# Patient Record
Sex: Female | Born: 1940 | Race: White | Hispanic: No | Marital: Married | Smoking: Former smoker
Health system: Southern US, Community
[De-identification: ages and names within clinical notes are randomized; demographics above are authoritative.]

## PROBLEM LIST (undated history)

## (undated) DIAGNOSIS — R32 Unspecified urinary incontinence: Secondary | ICD-10-CM

## (undated) DIAGNOSIS — C3490 Malignant neoplasm of unspecified part of unspecified bronchus or lung: Secondary | ICD-10-CM

## (undated) DIAGNOSIS — T7840XA Allergy, unspecified, initial encounter: Secondary | ICD-10-CM

## (undated) DIAGNOSIS — F329 Major depressive disorder, single episode, unspecified: Secondary | ICD-10-CM

## (undated) DIAGNOSIS — K219 Gastro-esophageal reflux disease without esophagitis: Secondary | ICD-10-CM

## (undated) DIAGNOSIS — I10 Essential (primary) hypertension: Secondary | ICD-10-CM

## (undated) DIAGNOSIS — K760 Fatty (change of) liver, not elsewhere classified: Secondary | ICD-10-CM

## (undated) DIAGNOSIS — I519 Heart disease, unspecified: Secondary | ICD-10-CM

## (undated) DIAGNOSIS — F32A Depression, unspecified: Secondary | ICD-10-CM

## (undated) DIAGNOSIS — F419 Anxiety disorder, unspecified: Secondary | ICD-10-CM

## (undated) HISTORY — DX: Gastro-esophageal reflux disease without esophagitis: K21.9

## (undated) HISTORY — DX: Fatty (change of) liver, not elsewhere classified: K76.0

## (undated) HISTORY — DX: Heart disease, unspecified: I51.9

## (undated) HISTORY — PX: NECK SURGERY: SHX720

## (undated) HISTORY — DX: Anxiety disorder, unspecified: F41.9

## (undated) HISTORY — DX: Essential (primary) hypertension: I10

## (undated) HISTORY — DX: Depression, unspecified: F32.A

## (undated) HISTORY — DX: Major depressive disorder, single episode, unspecified: F32.9

## (undated) HISTORY — PX: ABDOMINAL HYSTERECTOMY: SHX81

## (undated) HISTORY — DX: Malignant neoplasm of unspecified part of unspecified bronchus or lung: C34.90

## (undated) HISTORY — PX: LUNG CANCER SURGERY: SHX702

## (undated) HISTORY — DX: Unspecified urinary incontinence: R32

## (undated) HISTORY — DX: Allergy, unspecified, initial encounter: T78.40XA

---

## 1975-02-09 HISTORY — PX: LAPAROSCOPIC HYSTERECTOMY: SHX1926

## 2004-09-15 ENCOUNTER — Ambulatory Visit: Payer: Self-pay | Admitting: Unknown Physician Specialty

## 2006-04-18 ENCOUNTER — Ambulatory Visit: Payer: Self-pay | Admitting: Unknown Physician Specialty

## 2006-11-14 ENCOUNTER — Ambulatory Visit: Payer: Self-pay | Admitting: Unknown Physician Specialty

## 2006-11-17 ENCOUNTER — Ambulatory Visit: Payer: Self-pay | Admitting: Internal Medicine

## 2007-09-14 ENCOUNTER — Ambulatory Visit: Payer: Self-pay | Admitting: Internal Medicine

## 2007-09-19 ENCOUNTER — Encounter: Payer: Self-pay | Admitting: Internal Medicine

## 2007-10-02 ENCOUNTER — Ambulatory Visit: Payer: Self-pay | Admitting: Family Medicine

## 2007-10-10 ENCOUNTER — Encounter: Payer: Self-pay | Admitting: Internal Medicine

## 2008-08-21 ENCOUNTER — Ambulatory Visit: Payer: Self-pay | Admitting: Unknown Physician Specialty

## 2008-10-04 ENCOUNTER — Ambulatory Visit: Payer: Self-pay | Admitting: Family Medicine

## 2010-07-13 ENCOUNTER — Ambulatory Visit: Payer: Self-pay | Admitting: Unknown Physician Specialty

## 2012-03-18 ENCOUNTER — Emergency Department: Payer: Self-pay | Admitting: Emergency Medicine

## 2012-03-18 LAB — COMPREHENSIVE METABOLIC PANEL
Albumin: 4.1 g/dL (ref 3.4–5.0)
Alkaline Phosphatase: 94 U/L (ref 50–136)
Anion Gap: 8 (ref 7–16)
BUN: 24 mg/dL — ABNORMAL HIGH (ref 7–18)
Bilirubin,Total: 1.2 mg/dL — ABNORMAL HIGH (ref 0.2–1.0)
Calcium, Total: 9.7 mg/dL (ref 8.5–10.1)
Chloride: 103 mmol/L (ref 98–107)
Co2: 27 mmol/L (ref 21–32)
Creatinine: 1.13 mg/dL (ref 0.60–1.30)
EGFR (African American): 56 — ABNORMAL LOW
EGFR (Non-African Amer.): 49 — ABNORMAL LOW
Glucose: 134 mg/dL — ABNORMAL HIGH (ref 65–99)
Osmolality: 282 (ref 275–301)
Potassium: 4.3 mmol/L (ref 3.5–5.1)
SGOT(AST): 26 U/L (ref 15–37)
SGPT (ALT): 25 U/L (ref 12–78)
Sodium: 138 mmol/L (ref 136–145)
Total Protein: 8.5 g/dL — ABNORMAL HIGH (ref 6.4–8.2)

## 2012-03-18 LAB — URINALYSIS, COMPLETE
Hyaline Cast: 2
RBC,UR: 52 /HPF (ref 0–5)
Squamous Epithelial: 12
Transitional Epi: 4
WBC UR: 51 /HPF (ref 0–5)

## 2012-03-18 LAB — CBC
HCT: 45.1 % (ref 35.0–47.0)
HGB: 15.1 g/dL (ref 12.0–16.0)
MCH: 30.7 pg (ref 26.0–34.0)
MCHC: 33.5 g/dL (ref 32.0–36.0)
MCV: 92 fL (ref 80–100)
Platelet: 260 10*3/uL (ref 150–440)
RBC: 4.92 10*6/uL (ref 3.80–5.20)
RDW: 12.6 % (ref 11.5–14.5)
WBC: 13.2 10*3/uL — ABNORMAL HIGH (ref 3.6–11.0)

## 2012-03-18 LAB — TROPONIN I: Troponin-I: <0.02 ng/mL

## 2012-03-19 LAB — URINE CULTURE

## 2012-03-24 LAB — CULTURE, BLOOD (SINGLE)

## 2012-03-31 ENCOUNTER — Ambulatory Visit: Payer: Self-pay

## 2012-05-25 ENCOUNTER — Ambulatory Visit: Payer: Self-pay | Admitting: Unknown Physician Specialty

## 2013-08-30 DIAGNOSIS — N951 Menopausal and female climacteric states: Secondary | ICD-10-CM | POA: Insufficient documentation

## 2013-08-30 DIAGNOSIS — M797 Fibromyalgia: Secondary | ICD-10-CM | POA: Insufficient documentation

## 2013-08-30 DIAGNOSIS — K760 Fatty (change of) liver, not elsewhere classified: Secondary | ICD-10-CM | POA: Insufficient documentation

## 2013-08-30 DIAGNOSIS — N183 Chronic kidney disease, stage 3 unspecified: Secondary | ICD-10-CM | POA: Insufficient documentation

## 2013-08-30 DIAGNOSIS — I10 Essential (primary) hypertension: Secondary | ICD-10-CM | POA: Insufficient documentation

## 2013-08-30 DIAGNOSIS — J302 Other seasonal allergic rhinitis: Secondary | ICD-10-CM | POA: Insufficient documentation

## 2013-08-30 DIAGNOSIS — I341 Nonrheumatic mitral (valve) prolapse: Secondary | ICD-10-CM | POA: Insufficient documentation

## 2014-02-08 DIAGNOSIS — C3490 Malignant neoplasm of unspecified part of unspecified bronchus or lung: Secondary | ICD-10-CM

## 2014-02-08 HISTORY — DX: Malignant neoplasm of unspecified part of unspecified bronchus or lung: C34.90

## 2014-03-13 ENCOUNTER — Ambulatory Visit: Payer: Self-pay | Admitting: Internal Medicine

## 2014-03-29 ENCOUNTER — Ambulatory Visit: Payer: Self-pay | Admitting: Oncology

## 2014-04-04 ENCOUNTER — Ambulatory Visit: Payer: Self-pay | Admitting: Oncology

## 2014-04-09 ENCOUNTER — Ambulatory Visit
Admit: 2014-04-09 | Disposition: A | Discharge status: Home / Self Care | Payer: Self-pay | Attending: Oncology | Admitting: Oncology

## 2014-04-24 ENCOUNTER — Ambulatory Visit: Payer: Self-pay

## 2014-04-30 ENCOUNTER — Inpatient Hospital Stay: Payer: Self-pay

## 2014-05-03 LAB — CBC WITH DIFFERENTIAL/PLATELET
Basophil #: 0 10*3/uL (ref 0.0–0.1)
Basophil %: 0.4 %
Eosinophil #: 0.1 10*3/uL (ref 0.0–0.7)
Eosinophil %: 0.8 %
HCT: 33.2 % — ABNORMAL LOW (ref 35.0–47.0)
HGB: 10.8 g/dL — ABNORMAL LOW (ref 12.0–16.0)
Lymphocyte #: 1.2 10*3/uL (ref 1.0–3.6)
Lymphocyte %: 10 %
MCH: 29.8 pg (ref 26.0–34.0)
MCHC: 32.5 g/dL (ref 32.0–36.0)
MCV: 92 fL (ref 80–100)
Monocyte #: 1.1 x10 3/mm — ABNORMAL HIGH (ref 0.2–0.9)
Monocyte %: 9.2 %
Neutrophil #: 9.7 10*3/uL — ABNORMAL HIGH (ref 1.4–6.5)
Neutrophil %: 79.6 %
Platelet: 163 10*3/uL (ref 150–440)
RBC: 3.62 10*6/uL — ABNORMAL LOW (ref 3.80–5.20)
RDW: 12.8 % (ref 11.5–14.5)
WBC: 12.2 10*3/uL — ABNORMAL HIGH (ref 3.6–11.0)

## 2014-05-03 LAB — BASIC METABOLIC PANEL
Anion Gap: 5 — ABNORMAL LOW (ref 7–16)
BUN: 8 mg/dL
Calcium, Total: 8 mg/dL — ABNORMAL LOW
Chloride: 104 mmol/L
Co2: 27 mmol/L
Creatinine: 0.65 mg/dL
EGFR (African American): >60
EGFR (Non-African Amer.): >60
Glucose: 112 mg/dL — ABNORMAL HIGH
Potassium: 3.6 mmol/L
Sodium: 136 mmol/L

## 2014-05-10 ENCOUNTER — Ambulatory Visit
Admit: 2014-05-10 | Disposition: A | Discharge status: Home / Self Care | Payer: Self-pay | Attending: Oncology | Admitting: Oncology

## 2014-05-24 ENCOUNTER — Other Ambulatory Visit: Payer: Self-pay | Admitting: Oncology

## 2014-05-24 DIAGNOSIS — C3490 Malignant neoplasm of unspecified part of unspecified bronchus or lung: Secondary | ICD-10-CM

## 2014-06-03 LAB — SURGICAL PATHOLOGY

## 2014-06-09 NOTE — Discharge Summary (Signed)
PATIENT NAME:  Christy, [name] MR#:  [id number] DATE OF BIRTH:  21-Jul-1940  DATE OF ADMISSION:  04/30/2014 DATE OF DISCHARGE:  05/06/2014  ADMITTING DIAGNOSIS: Right upper lobe mass.   DISCHARGE DIAGNOSIS: Right upper lobe mass (final pathology revealing a T1b, N0, M0 adenocarcinoma of the lung).   HOSPITAL COURSE: [name] is a 74 year old, white female, who was recently diagnosed with a right upper lobe mass. This mass was positive on PET scan and had characteristics consistent with a primary lung cancer. She was appropriately evaluated and found to be a suitable candidate for surgery. She was admitted to the hospital following her right thoracotomy and right upper lobectomy. She was nursed overnight in the intensive care unit and then for several days on the floor. Ultimately, her chest tubes were able to be removed and she did not have any air leak or pleural fluid. After the chest tubes were removed, her chest x-ray showed the lung to be completely expanded. There were no untoward complications in the postoperative period and she was discharged to home.   At the time of her discharge, her wounds were healing as expected. Her lungs were clear and her heart was regular.   DISCHARGE INSTRUCTIONS: She was given instructions to follow up with Dr. Genevive Bi in 1 week. She will have a chest x-ray made prior to her visit.   MEDICATIONS: At the time of discharge included benazepril, estradiol, calcium, Lipoflavonoid, multivitamins, magnesium, Ocuvite and vitamin D. She was also given a prescription for Norco 5/325 to take as needed.   Thank you very much for allowing me to participate in her care.   ____________________________ Lew Dawes. Genevive Bi, MD teo:JT D: 05/20/2014 11:53:32 ET T: 05/20/2014 12:05:54 ET JOB#: [id number]  cc: [name] E. Genevive Bi, MD, <Dictator>  Louis Matte MD ELECTRONICALLY SIGNED 05/28/2014 11:05

## 2014-06-09 NOTE — Op Note (Signed)
PATIENT NAME:  Christy [name], Christy [name] MR#:  [id number] DATE OF BIRTH:  1940-07-02  DATE OF PROCEDURE:  04/30/2014  SURGEON:  [name] E. Genevive Bi, MD  ASSISTANT:  Dr. Bronson Ing   PREOPERATIVE DIAGNOSIS: Right upper lobe mass.   POSTOPERATIVE DIAGNOSIS: Right upper lobe mass.  OPERATIONS PERFORMED: 1. Preoperative bronchoscopy to assess endobronchial anatomy.  2. Right thoracotomy with right upper lobectomy.   INDICATIONS FOR PROCEDURE: Christy [name] is a 74 year old woman with a prior history of smoking who was recently evaluated and found to be a suitable candidate for surgical resection of a right upper lobe mass identified on routine examination. She had been extensively evaluated by Dr. Delight Hoh and found to be a suitable candidate from his standpoint and after being apprised of the indications and risks of surgery, she gave her informed consent.   DESCRIPTION OF PROCEDURE: The patient was brought to the operating suite and placed in the supine position. General endotracheal anesthesia was given with a double-lumen tube. Preoperative bronchoscopy was carried out and was normal to the subsegmental levels bilaterally. The patient was then turned for a right thoracotomy. All pressure points were carefully padded. The patient was prepped and draped in the usual sterile fashion. A posterolateral 5th interspace thoracotomy was performed. The latissimus muscle was divided but the serratus was spared. The chest was entered. We could see that there was a large mass within the right upper lobe measuring approximately 3 cm.   There were some adhesions to the right upper lobe which were taken down with electrocautery. The pleura around the hilum was incised. We identified the branches of the pulmonary artery going to the upper lobe as well as the branches of the pulmonary vein. The middle lobe vein was quite separate from the upper lobe vein. We opened the fissure and identified the posterior ascending branch  and then completed the fissures both anteriorly and posteriorly with an endoscopic stapler with gold loads. The branches of the pulmonary artery were in their standard locations. The apical anterior branch and the posterior ascending branch were then taken with the stapler. The superior pulmonary vein to the upper lobe was taken with the stapler as well. The only remaining structure at this point was the bronchus. This was secured with a TX30 stapler. Before dividing the bronchus, the middle and lower lobes were ventilated and they ventilated quite nicely. We then transected the bronchus. We then leak checked the bronchus at 30 cm of water pressure and there was no air leak. Two chest tubes were inserted in standard fashion. The tubes were secured with #1 silk. The inferior pulmonary ligament was divided.   The middle and lower lobes ventilated quite nicely, and we then closed the chest. Vicryl #2 paracostal sutures were used to approximate the ribs. There was a rib fracture inferiorly and we used a rongeur to avoid any overriding rib fragments. The serratus muscle was allowed to return to its normal anatomic position. The latissimus muscle was closed with #2 Vicryl.   The subcutaneous tissues and skin were closed with running absorbable sutures with the skin being closed with staples. Sterile dressings were applied. The patient tolerated the procedure well and was then taken to the recovery room in stable condition.   CC:   Dr. Bronson Ing    ____________________________ Lew Dawes. Genevive Bi, MD teo:tr D: 04/30/2014 16:59:28 ET T: 04/30/2014 17:39:56 ET JOB#: [id number]  cc: [name] E. Genevive Bi, MD, <Dictator> Kathlene November. Grayland Ormond, [place] MD ELECTRONICALLY SIGNED  05/20/2014 12:09 

## 2014-06-10 ENCOUNTER — Other Ambulatory Visit: Payer: Self-pay | Admitting: *Deleted

## 2014-06-10 NOTE — Telephone Encounter (Signed)
States Dr Genevive Bi offered her pain med when she was here last week but she refused them and is now wishing she had taken them, Can she get pain med?

## 2014-06-10 NOTE — Telephone Encounter (Signed)
Left message with husband. RX ready to be picked up at cancer center. Pt's husband can pick up rx, but he must bring his id.

## 2014-06-13 ENCOUNTER — Ambulatory Visit: Payer: Self-pay | Admitting: Cardiothoracic Surgery

## 2014-06-28 ENCOUNTER — Telehealth: Payer: Self-pay | Admitting: *Deleted

## 2014-06-28 NOTE — Telephone Encounter (Signed)
Adv. Home care nurse came to see patient today.  The patient states that her surgery site is still "very tender" RN evaluated pt's surgery site. According to the nurse, the site feels "swollen" "surgery site is tender to touch" Pt and Home care RN requesting advice from Dr. Genevive Bi.

## 2014-07-11 NOTE — Telephone Encounter (Signed)
-----   Message from [place] sent at 07/10/2014 10:44 AM EDT ----- Husband stated Rt shoulder is uncomfortable and ribs are sore wants to make pt an appt, she is unable to talk at moment due to nodule on voice box.

## 2014-07-11 NOTE — Telephone Encounter (Signed)
Per Christy [name], Patient decided to come on 07/29/14. She does not feel like coming today for a clinic apt although the apt was offered to the client and her husband.

## 2014-07-15 ENCOUNTER — Telehealth: Payer: Self-pay | Admitting: *Deleted

## 2014-07-15 NOTE — Telephone Encounter (Signed)
Patient's husband called to get an apt with Dr. Genevive Bi.  The patient's husband states that the pt has completely lost her voice. "She is unable to talk." He states, "I am just relaying a written message from my wife. My wife needs to see Dr. Genevive Bi to examine her surgery side. She hurts all the time near her shoulder blade and on her side. I am not sure what to do. I would like to talk to Dr. Genevive Bi."  Apt offered for Thursday, July 18, 2014 at 1030am. I reminded the patient's husband that an apt was offered last week.  According to the phone notes, the patient refused to take this apt due to his wife's "other health concerns." Although, the patient's husband states that "I do not remember you offering an appointment for last week." pt's husband grateful for the appointment.

## 2014-07-17 ENCOUNTER — Other Ambulatory Visit: Payer: Self-pay | Admitting: Cardiothoracic Surgery

## 2014-07-17 DIAGNOSIS — C3411 Malignant neoplasm of upper lobe, right bronchus or lung: Secondary | ICD-10-CM

## 2014-07-18 ENCOUNTER — Ambulatory Visit: Admission: RE | Admit: 2014-07-18 | Payer: Medicare Other | Source: Ambulatory Visit | Admitting: Cardiothoracic Surgery

## 2014-07-18 ENCOUNTER — Ambulatory Visit
Admission: RE | Admit: 2014-07-18 | Discharge: 2014-07-18 | Disposition: A | Discharge status: Home / Self Care | Payer: Medicare Other | Source: Ambulatory Visit | Attending: Cardiothoracic Surgery | Admitting: Cardiothoracic Surgery

## 2014-07-18 ENCOUNTER — Encounter: Payer: Self-pay | Admitting: Cardiothoracic Surgery

## 2014-07-18 ENCOUNTER — Inpatient Hospital Stay: Payer: Medicare Other | Attending: Cardiothoracic Surgery | Admitting: Cardiothoracic Surgery

## 2014-07-18 ENCOUNTER — Ambulatory Visit: Admission: RE | Admit: 2014-07-18 | Payer: Medicare Other | Source: Ambulatory Visit

## 2014-07-18 VITALS — BP 99/64 | HR 67 | Temp 97.6°F | Resp 18 | Ht 66.0 in | Wt 143.3 lb

## 2014-07-18 DIAGNOSIS — C3401 Malignant neoplasm of right main bronchus: Secondary | ICD-10-CM

## 2014-07-18 DIAGNOSIS — Z9889 Other specified postprocedural states: Secondary | ICD-10-CM | POA: Diagnosis not present

## 2014-07-18 DIAGNOSIS — M810 Age-related osteoporosis without current pathological fracture: Secondary | ICD-10-CM | POA: Insufficient documentation

## 2014-07-18 DIAGNOSIS — C3411 Malignant neoplasm of upper lobe, right bronchus or lung: Secondary | ICD-10-CM | POA: Diagnosis not present

## 2014-07-18 DIAGNOSIS — K219 Gastro-esophageal reflux disease without esophagitis: Secondary | ICD-10-CM | POA: Insufficient documentation

## 2014-07-18 DIAGNOSIS — K589 Irritable bowel syndrome without diarrhea: Secondary | ICD-10-CM | POA: Insufficient documentation

## 2014-07-18 NOTE — Progress Notes (Signed)
[place] [facility] Follow Up Visit  Name: Christy [name] Datee:07/18/2014 MRN: [id number] DOB:05/24/1940  Subjective: This patient is status post right upper lobectomy by way of a right thoracotomy about 2 months ago.  She called because she was complaining of some soreness in her right scapular area. She denied any shortness of breath. She does not have any true pain but more of a discomfort when she moves her arm forward. She's had no fevers or chills. She has not been driving or lifting. She would like to return to work at the end of the month.  Objective: She is a well-developed well-nourished woman in no distress. Her thoracotomy wound is well healed. There is no palpable deformities on the right lateral chest wall. Her lungs are clear. Her heart is regular.  Assessment: I have independently reviewed the patient's chest x-ray. There are some rib fractures present. There is no pneumothorax pleural effusion or evidence of recurrent disease.  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  Plan: I've explained the patient that she may go back to work at the end of the month. I also explained to her that physical therapy may be helpful to her. We will go ahead and set her up for referral to our physical therapy department. In addition she is scheduled to come back in October for a CT scan with Dr. Grayland Ormond. I will see her at that time if need be.

## 2014-07-19 DIAGNOSIS — C3411 Malignant neoplasm of upper lobe, right bronchus or lung: Secondary | ICD-10-CM | POA: Insufficient documentation

## 2014-07-22 ENCOUNTER — Ambulatory Visit
Admission: RE | Admit: 2014-07-22 | Discharge: 2014-07-22 | Disposition: A | Discharge status: Home / Self Care | Payer: Medicare Other | Source: Ambulatory Visit | Attending: Otolaryngology | Admitting: Otolaryngology

## 2014-07-22 ENCOUNTER — Encounter
Admission: RE | Disposition: A | Discharge status: Home / Self Care | Payer: Self-pay | Source: Ambulatory Visit | Attending: Otolaryngology

## 2014-07-22 ENCOUNTER — Ambulatory Visit: Payer: Medicare Other | Admitting: Certified Registered Nurse Anesthetist

## 2014-07-22 ENCOUNTER — Encounter: Payer: Self-pay | Admitting: *Deleted

## 2014-07-22 DIAGNOSIS — J381 Polyp of vocal cord and larynx: Secondary | ICD-10-CM | POA: Diagnosis not present

## 2014-07-22 DIAGNOSIS — K219 Gastro-esophageal reflux disease without esophagitis: Secondary | ICD-10-CM | POA: Insufficient documentation

## 2014-07-22 DIAGNOSIS — M810 Age-related osteoporosis without current pathological fracture: Secondary | ICD-10-CM | POA: Insufficient documentation

## 2014-07-22 DIAGNOSIS — Z79899 Other long term (current) drug therapy: Secondary | ICD-10-CM | POA: Diagnosis not present

## 2014-07-22 DIAGNOSIS — Z90710 Acquired absence of both cervix and uterus: Secondary | ICD-10-CM | POA: Insufficient documentation

## 2014-07-22 DIAGNOSIS — Z9013 Acquired absence of bilateral breasts and nipples: Secondary | ICD-10-CM | POA: Insufficient documentation

## 2014-07-22 DIAGNOSIS — I10 Essential (primary) hypertension: Secondary | ICD-10-CM | POA: Insufficient documentation

## 2014-07-22 DIAGNOSIS — M797 Fibromyalgia: Secondary | ICD-10-CM | POA: Insufficient documentation

## 2014-07-22 HISTORY — PX: LARYNGOSCOPY: SHX5203

## 2014-07-22 SURGERY — LARYNGOSCOPY
Anesthesia: General

## 2014-07-22 MED ORDER — LACTATED RINGERS IV SOLN
INTRAVENOUS | Status: DC | PRN
  Administered 2014-07-22: 11:00:00 via INTRAVENOUS

## 2014-07-22 MED ORDER — PHENYLEPHRINE HCL 10 % OP SOLN
Freq: Once | OPHTHALMIC | Status: AC
  Administered 2014-07-22: 1 mL via TOPICAL
  Filled 2014-07-22: qty 10

## 2014-07-22 MED ORDER — ONDANSETRON HCL 4 MG/2ML IJ SOLN: 4.0000 mg | Freq: Once | INTRAMUSCULAR | Status: DC | PRN

## 2014-07-22 MED ORDER — PROPOFOL 1000 MG/100ML IV EMUL
INTRAVENOUS | Status: DC | PRN
  Administered 2014-07-22: 150 ug/kg/min via INTRAVENOUS

## 2014-07-22 MED ORDER — ROCURONIUM BROMIDE 100 MG/10ML IV SOLN
INTRAVENOUS | Status: DC | PRN
  Administered 2014-07-22: 20 mg via INTRAVENOUS

## 2014-07-22 MED ORDER — HYDROMORPHONE HCL 1 MG/ML IJ SOLN: 0.2500 mg | INTRAMUSCULAR | Status: DC | PRN

## 2014-07-22 MED ORDER — NEOSTIGMINE METHYLSULFATE 10 MG/10ML IV SOLN
INTRAVENOUS | Status: DC | PRN
  Administered 2014-07-22: 2 mg via INTRAVENOUS

## 2014-07-22 MED ORDER — MIDAZOLAM HCL 2 MG/2ML IJ SOLN
INTRAMUSCULAR | Status: DC | PRN
  Administered 2014-07-22: 2 mg via INTRAVENOUS

## 2014-07-22 MED ORDER — PROPOFOL INFUSION 10 MG/ML OPTIME: INTRAVENOUS | Status: DC | PRN

## 2014-07-22 MED ORDER — FENTANYL CITRATE (PF) 100 MCG/2ML IJ SOLN
INTRAMUSCULAR | Status: DC | PRN
  Administered 2014-07-22 (×2): 50 ug via INTRAVENOUS

## 2014-07-22 MED ORDER — ONDANSETRON HCL 4 MG/2ML IJ SOLN
INTRAMUSCULAR | Status: DC | PRN
  Administered 2014-07-22: 4 mg via INTRAVENOUS

## 2014-07-22 MED ORDER — DEXAMETHASONE SODIUM PHOSPHATE 4 MG/ML IJ SOLN
INTRAMUSCULAR | Status: DC | PRN
  Administered 2014-07-22: 10 mg via INTRAVENOUS

## 2014-07-22 MED ORDER — GLYCOPYRROLATE 0.2 MG/ML IJ SOLN
INTRAMUSCULAR | Status: DC | PRN
  Administered 2014-07-22: 0.3 mg via INTRAVENOUS

## 2014-07-22 SURGICAL SUPPLY — 20 items
BASIN GRAD PLASTIC 32OZ STRL (MISCELLANEOUS) ×2 IMPLANT
CNTNR SPEC 2.5X3XGRAD LEK (MISCELLANEOUS) ×1
CONT SPEC 4OZ STER OR WHT (MISCELLANEOUS) ×1
CONT SPEC 4OZ STRL OR WHT (MISCELLANEOUS) ×1
CONTAINER SPEC 2.5X3XGRAD LEK (MISCELLANEOUS) ×1 IMPLANT
DRAPE TABLE BACK 80X90 (DRAPES) ×2 IMPLANT
DRESSING TELFA 4X3 1S ST N-ADH (GAUZE/BANDAGES/DRESSINGS) ×2 IMPLANT
GAUZE SPONGE 4X4 12PLY STRL (GAUZE/BANDAGES/DRESSINGS) ×2 IMPLANT
GLOVE EXAM LX STRL 7.5 (GLOVE) ×2 IMPLANT
GOWN STRL REUS W/ TWL LRG LVL3 (GOWN DISPOSABLE) ×1 IMPLANT
GOWN STRL REUS W/TWL LRG LVL3 (GOWN DISPOSABLE) ×2
KIT RM TURNOVER STRD PROC AR (KITS) ×2 IMPLANT
NDL FILTER BLUNT 18X1 1/2 (NEEDLE) ×1 IMPLANT
NDL SAFETY 18GX1.5 (NEEDLE) ×2 IMPLANT
NEEDLE FILTER BLUNT 18X 1/2SAF (NEEDLE) ×1
NEEDLE FILTER BLUNT 18X1 1/2 (NEEDLE) ×1 IMPLANT
PATTIES SURGICAL .5 X.5 (GAUZE/BANDAGES/DRESSINGS) ×2 IMPLANT
SOL ANTI-FOG 6CC FOG-OUT (MISCELLANEOUS) ×1 IMPLANT
SOL FOG-OUT ANTI-FOG 6CC (MISCELLANEOUS) ×1
TUBING CONNECTING 10 (TUBING) ×2 IMPLANT

## 2014-07-22 NOTE — Anesthesia Postprocedure Evaluation (Signed)
  Anesthesia Post-op Note  Patient: Christy [name]  Procedure(s) Performed: Procedure(s): JET LARYNGOSCOPY with right vocal cord biopsy (N/A)  Anesthesia type:General  Patient location: PACU  Post pain: Pain level controlled  Post assessment: Post-op Vital signs reviewed, Patient's Cardiovascular Status Stable, Respiratory Function Stable, Patent Airway and No signs of Nausea or vomiting  Post vital signs: Reviewed and stable  Last Vitals:  Filed Vitals:   07/22/14 1234  BP:   Pulse: 74  Temp:   Resp: 10    Level of consciousness: awake, alert  and patient cooperative  Complications: No apparent anesthesia complications

## 2014-07-22 NOTE — Anesthesia Preprocedure Evaluation (Signed)
Anesthesia Evaluation  Patient identified by MRN, date of birth, ID band Patient awake    Reviewed: Allergy & Precautions, NPO status , Patient's Chart, lab work & pertinent test results  Airway Mallampati: I  TM Distance: >3 FB Neck ROM: Limited    Dental  (+) Upper Dentures, Lower Dentures   Pulmonary former smoker,    Pulmonary exam normal       Cardiovascular hypertension, Pt. on medications Normal cardiovascular exam    Neuro/Psych    GI/Hepatic GERD-  Medicated and Controlled,  Endo/Other    Renal/GU      Musculoskeletal   Abdominal Normal abdominal exam  (+)   Peds  Hematology   Anesthesia Other Findings   Reproductive/Obstetrics                             Anesthesia Physical Anesthesia Plan  ASA: III  Anesthesia Plan: General   Post-op Pain Management:    Induction: Intravenous  Airway Management Planned: Natural Airway  Additional Equipment:   Intra-op Plan:   Post-operative Plan:   Informed Consent: I have reviewed the patients History and Physical, chart, labs and discussed the procedure including the risks, benefits and alternatives for the proposed anesthesia with the patient or authorized representative who has indicated his/her understanding and acceptance.     Plan Discussed with: CRNA  Anesthesia Plan Comments:         Anesthesia Quick Evaluation

## 2014-07-22 NOTE — Transfer of Care (Signed)
Immediate Anesthesia Transfer of Care Note  Patient: Christy [name]  Procedure(s) Performed: Procedure(s): JET LARYNGOSCOPY with right vocal cord biopsy (N/A)  Patient Location: PACU  Anesthesia Type:General  Level of Consciousness: sedated  Airway & Oxygen Therapy: Patient Spontanous Breathing and Patient connected to face mask oxygen  Post-op Assessment: Report given to RN and Post -op Vital signs reviewed and stable  Post vital signs: Reviewed and stable  Last Vitals:  Filed Vitals:   07/22/14 1234  BP:   Pulse: 74  Temp:   Resp: 10   BP: 123/72 Pulse: 69 Temp: 82.5 KN:39 Complications: No apparent anesthesia complications

## 2014-07-22 NOTE — H&P (Signed)
  H&P has been reviewed and no changes necessary. To be downloaded later. 

## 2014-07-22 NOTE — Op Note (Signed)
07/22/2014  12:19 PM    Heath Lark  [id number]   Pre-Op Dx:  Right vocal cord lesion  Post-op Dx: Right vocal cord lesion  Proc: Direct microlaryngoscopy and excision right vocal cord lesion   Surg:  Tiki Tucciarone H  Anes:  GOT  EBL:  Minimal  Comp:  None  Findings:  A light pink to white smooth growth coming off the right posterior cord where it attaches to the arytenoid.  Procedure: The patient was brought to the operating room and placed in a supine position she was given general anesthesia by mask to begin with. Once she was post-LEEP and paralyzed a Jako J Kale laryngoscope was used to visualize the hypopharynx and larynx Louis arm was used for stabilization. With the cords lesion clearly visible A Jesup elevator was placed through the left light hole 2 give anesthesia through jet ventilation. Her oxygenation remained good using the jet ventilator. The endoscope was brought in for high-power visualization. Photographs were taken of the lesion.  The lesion was then grasped with up-biting cup forceps and a up-biting microhole forceps scissors were used for cutting the stalk that attached this to the arytenoid. Entire piece was removed and sent for permanent section. There is a small amount of oozing that was coming from the place this stalk was cut from. Cottonoid pledgets soaked in phenylephrine 1:1000 were used for vasoconstriction. A picture was taken after this to show the healing area.  The patient tolerated the procedure well. The scope was removed and she was turned back over to anesthesia who awakened her without difficulty. She was transferred to the recovery room. There were no operative complications.  Dispo:   To PACU then to home  Plan:  To follow-up in the office in 4 days to reevaluate the larynx and go over pathology report. She will use voice rest at home. Regular diet.  Legion Discher H  07/22/2014 12:19 PM

## 2014-07-22 NOTE — Discharge Instructions (Addendum)
Laryngoscopy A laryngoscopy is a procedure performed to view the back of the throat, vocal cords, and voice box (larynx). It may be done in order to figure out why a person has:  A cough.  Voice changes, such as new hoarseness.  Throat pain.  Problems swallowing. During a laryngoscopy, tissue samples (biopsies) can be taken or foreign bodies can be removed. A laryngoscopy can be done in the caregiver's office. It may be performed with a hand-held mirror, or it may require the use of a fiberoptic scope. Under some circumstances, it may be done in a hospital with medicine to help you sleep (general anesthesia). LET YOUR CAREGIVER KNOW ABOUT:   Allergies.  Medicines taken, including herbs, eyedrops, over-the-counter medicines, and creams.  Use of steroids (by mouth or creams).  Previous problems with anesthetics or numbing medicines.  History of bleeding or blood problems.  History of blood clots.  Possibility of pregnancy, if this applies.  Previous surgery.  Other health problems. RISKS AND COMPLICATIONS   Pain.  Gagging.  Vomiting.  Swelling.  Bleeding.  Problems from anesthesia. BEFORE THE PROCEDURE  Several days before the procedure, you may have blood tests to make sure the blood clots normally. You may be asked to stop taking blood thinners, aspirin, and/or nonsteroidal anti-inflammatory drugs (NSAIDs) before the procedure. Do not give aspirin to children. If general anesthesia is going to be used, you will usually be asked to stop eating and drinking at least 8 hours before the procedure. Have someone go with you to the procedure in order to drive you home afterward. PROCEDURE  If you are having the procedure in the caregiver's office, it is usually performed while sitting up in a special exam chair with a headrest. A numbing medicine will be sprayed into the mouth and on the back of the throat. Your caregiver will use a gauze pad to hold the tongue out of the way.  A mirror will be held at the back of the throat to allow your caregiver to see down the throat. You may be asked to make certain sounds so that vocal cord movement can be observed. If a fiberoptic scope is being used, it will be inserted into either the nose or the mouth and slipped into the throat. Your caregiver can look through an eyepiece or can see an image projected on a monitor. Pieces of tissue can be taken (biopsies) or foreign bodies removed. If you need to have the procedure performed under general anesthesia, you will be lying down on a special operating table. The same kinds of procedures will be followed, but you will not be aware of them. AFTER THE PROCEDURE   When laryngoscopy is done with only local numbing, it usually does not require any changes to your activity level after the procedure is complete.  You may have a sore throat.  You may be asked to rest the voice for some length of time after the procedure.  Do not smoke.  Follow your caregiver's directions regarding eating and drinking after the procedure.  If you had a biopsy taken, your caregiver may advise trying to avoid coughing, whispering, or clearing the throat.  If you were given a general anesthetic or a medicine to help you relax (sedative), you will be sleepy.  There may be some pain or you may feel sick to your stomach (nauseous), but this can usually be controlled with medicines taken by mouth.  You will stay in the recovery room until awake and  able to drink fluids.  You can go back to his or her usual level of activity within several days. HOME CARE INSTRUCTIONS   Take all medicines exactly as directed.  Follow any prescribed diet.  Follow instructions regarding rest (including voice rest) and physical activity.  Follow instructions for the use of throat lozenges or gargles. SEEK IMMEDIATE MEDICAL CARE IF:   You have severe pain.  You have new bleeding during coughing, spitting, or  vomiting.  You develop nausea and vomiting.  You develop new problems with swallowing.  You have difficulty breathing or have shortness of breath.  You have chest pain.  Your voice changes.  You have a fever.  You develop a cough. MAKE SURE YOU:   Understand these instructions.  Will watch your condition.  Will get help right away if you are not doing well or get worse. Document Released: 04/21/2009 Document Revised: 06/11/2013 Document Reviewed: 04/21/2009 [facility] Patient Information 2015 [place], [place]. This information is not intended to replace advice given to you by your health care provider. Make sure you discuss any questions you have with your health care provider. General Anesthesia General anesthesia is a sleep-like state of non-feeling produced by medicines (anesthetics). General anesthesia prevents you from being alert and feeling pain during a medical procedure. Your caregiver may recommend general anesthesia if your procedure:  Is long.  Is painful or uncomfortable.  Would be frightening to see or hear.  Requires you to be still.  Affects your breathing.  Causes significant blood loss. LET YOUR CAREGIVER KNOW ABOUT:  Allergies to food or medicine.  Medicines taken, including vitamins, herbs, eyedrops, over-the-counter medicines, and creams.  Use of steroids (by mouth or creams).  Previous problems with anesthetics or numbing medicines, including problems experienced by relatives.  History of bleeding problems or blood clots.  Previous surgeries and types of anesthetics received.  Possibility of pregnancy, if this applies.  Use of cigarettes, alcohol, or illegal drugs.  Any health condition(s), especially diabetes, sleep apnea, and high blood pressure. RISKS AND COMPLICATIONS General anesthesia rarely causes complications. However, if complications do occur, they can be life threatening. Complications include:  A lung infection.  A  stroke.  A heart attack.  Waking up during the procedure. When this occurs, the patient may be unable to move and communicate that he or she is awake. The patient may feel severe pain. Older adults and adults with serious medical problems are more likely to have complications than adults who are young and healthy. Some complications can be prevented by answering all of your caregiver's questions thoroughly and by following all pre-procedure instructions. It is important to tell your caregiver if any of the pre-procedure instructions, especially those related to diet, were not followed. Any food or liquid in the stomach can cause problems when you are under general anesthesia. BEFORE THE PROCEDURE  Ask your caregiver if you will have to spend the night at the hospital. If you will not have to spend the night, arrange to have an adult drive you and stay with you for 24 hours.  Follow your caregiver's instructions if you are taking dietary supplements or medicines. Your caregiver may tell you to stop taking them or to reduce your dosage.  Do not smoke for as long as possible before your procedure. If possible, stop smoking 3-6 weeks before the procedure.  Do not take new dietary supplements or medicines within 1 week of your procedure unless your caregiver approves them.  Do not eat  within 8 hours of your procedure or as directed by your caregiver. Drink only clear liquids, such as water, black coffee (without milk or cream), and fruit juices (without pulp).  Do not drink within 3 hours of your procedure or as directed by your caregiver.  You may brush your teeth on the morning of the procedure, but make sure to spit out the toothpaste and water when finished. PROCEDURE  You will receive anesthetics through a mask, through an intravenous (IV) access tube, or through both. A doctor who specializes in anesthesia (anesthesiologist) or a nurse who specializes in anesthesia (nurse anesthetist) or both  will stay with you throughout the procedure to make sure you remain unconscious. He or she will also watch your blood pressure, pulse, and oxygen levels to make sure that the anesthetics do not cause any problems. Once you are asleep, a breathing tube or mask may be used to help you breathe. AFTER THE PROCEDURE You will wake up after the procedure is complete. You may be in the room where the procedure was performed or in a recovery area. You may have a sore throat if a breathing tube was used. You may also feel:  Dizzy.  Weak.  Drowsy.  Confused.  Nauseous.  Cold. These are all normal responses and can be expected to last for up to 24 hours after the procedure is complete. A caregiver will tell you when you are ready to go home. This will usually be when you are fully awake and in stable condition. Document Released: 05/04/2007 Document Revised: 06/11/2013 Document Reviewed: 05/26/2011 [facility] Patient Information 2015 [place], [place]. This information is not intended to replace advice given to you by your health care provider. Make sure you discuss any questions you have with your health care provider. AMBULATORY SURGERY  DISCHARGE INSTRUCTIONS   1) The drugs that you were given will stay in your system until tomorrow so for the next 24 hours you should not:  A) Drive an automobile B) Make any legal decisions C) Drink any alcoholic beverage   2) You may resume regular meals tomorrow.  Today it is better to start with liquids and gradually work up to solid foods.  You may eat anything you prefer, but it is better to start with liquids, then soup and crackers, and gradually work up to solid foods.   3) Please notify your doctor immediately if you have any unusual bleeding, trouble breathing, redness and pain at the surgery site, drainage, fever, or pain not relieved by medication. 4)   5) Your post-operative visit with Dr.    George Ina                                 is: Date:                         Time:    Please call to schedule your post-operative visit.  6) Additional Instructions: 7)

## 2014-07-23 LAB — SURGICAL PATHOLOGY

## 2014-07-29 ENCOUNTER — Telehealth: Payer: Self-pay | Admitting: *Deleted

## 2014-07-30 ENCOUNTER — Other Ambulatory Visit: Payer: Self-pay | Admitting: Cardiothoracic Surgery

## 2014-07-30 DIAGNOSIS — C3491 Malignant neoplasm of unspecified part of right bronchus or lung: Secondary | ICD-10-CM

## 2014-07-30 NOTE — Telephone Encounter (Signed)
Left msg for patient.  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 Md entered order for physical therapy.

## 2014-08-08 ENCOUNTER — Ambulatory Visit: Payer: Self-pay | Admitting: Cardiothoracic Surgery

## 2014-08-08 ENCOUNTER — Other Ambulatory Visit: Payer: Self-pay | Admitting: *Deleted

## 2014-08-08 DIAGNOSIS — C3490 Malignant neoplasm of unspecified part of unspecified bronchus or lung: Secondary | ICD-10-CM

## 2014-08-08 NOTE — Progress Notes (Signed)
Patient called. She still has not heard when her physical therapy apt is scheduled. She prefers to go to see Catalina Antigua, PT at [facility] for her physical therapy. An order for [facility] PT was entered per patient request/md order. Dx: shoulder pain. H/o adenocarcinoma of the lung s/p thoracotomy.  Patient also does not know when her f/u apt is scheduled for to see Dr. Grayland Ormond. I will ask the receptionist to call the patient with this appointment, which should be scheduled in October.

## 2014-09-02 DIAGNOSIS — Z79899 Other long term (current) drug therapy: Secondary | ICD-10-CM | POA: Insufficient documentation

## 2014-09-16 LAB — CBC WITH DIFFERENTIAL/PLATELET
Basophil #: 0 10*3/uL (ref 0.0–0.1)
Basophil %: 0.3 %
Eosinophil #: 0 10*3/uL (ref 0.0–0.7)
Eosinophil %: 0.1 %
HCT: 34.9 % — ABNORMAL LOW (ref 35.0–47.0)
HGB: 11.3 g/dL — ABNORMAL LOW (ref 12.0–16.0)
Lymphocyte #: 0.6 10*3/uL — ABNORMAL LOW (ref 1.0–3.6)
Lymphocyte %: 5.2 %
MCH: 29.8 pg (ref 26.0–34.0)
MCHC: 32.5 g/dL (ref 32.0–36.0)
MCV: 92 fL (ref 80–100)
Monocyte #: 0.9 x10 3/mm (ref 0.2–0.9)
Monocyte %: 7.3 %
Neutrophil #: 10.3 10*3/uL — ABNORMAL HIGH (ref 1.4–6.5)
Neutrophil %: 87.1 %
Platelet: 174 10*3/uL (ref 150–440)
RBC: 3.79 10*6/uL — ABNORMAL LOW (ref 3.80–5.20)
RDW: 12.9 % (ref 11.5–14.5)
WBC: 11.8 10*3/uL — ABNORMAL HIGH (ref 3.6–11.0)

## 2014-09-16 LAB — BASIC METABOLIC PANEL
Anion Gap: 6 — ABNORMAL LOW (ref 7–16)
BUN: 16 mg/dL
Calcium, Total: 7.8 mg/dL — ABNORMAL LOW
Chloride: 105 mmol/L
Co2: 25 mmol/L
Creatinine: 0.8 mg/dL
EGFR (African American): >60
EGFR (Non-African Amer.): >60
Glucose: 158 mg/dL — ABNORMAL HIGH
Potassium: 3.9 mmol/L
Sodium: 136 mmol/L

## 2014-10-03 ENCOUNTER — Telehealth: Payer: Self-pay | Admitting: Cardiothoracic Surgery

## 2014-10-03 ENCOUNTER — Telehealth: Payer: Self-pay | Admitting: *Deleted

## 2014-10-03 NOTE — Telephone Encounter (Signed)
error 

## 2014-10-03 NOTE — Telephone Encounter (Signed)
Patient called. She has "finished her physical therapy which was ordered under Dr. Genevive Bi previously" states, "I think that I have pulled something in the same shoulder again. It hurts. I think I need more physical therapy, Can Dr. Genevive Bi order this for the PT in [place] near Dr. Dorthula Perfect office?"

## 2014-10-03 NOTE — Telephone Encounter (Signed)
Attempted call back. msg left for patient on residential phone line that call was acknowledge. Presently Dr. Genevive Bi is out of the office. I will need to discuss the patient's request with Dr. Genevive Bi tomorrow.

## 2014-10-04 NOTE — Telephone Encounter (Signed)
Spoke with Dr. Genevive Bi. The patient needs to see her primary care provider to evaluate her left shoulder re-injury. Per Dr. Genevive Bi, the primary care provider needs to be the one to order physical therapy to the reinjured limb.  This message was relayed to the patient's husband as the patient was currently at work. RN also left a voice mail msg on patient's personal mobile phone relaying the same message.

## 2014-10-15 ENCOUNTER — Ambulatory Visit (INDEPENDENT_AMBULATORY_CARE_PROVIDER_SITE_OTHER): Payer: Medicare Other | Admitting: Urology

## 2014-10-15 ENCOUNTER — Encounter: Payer: Self-pay | Admitting: Urology

## 2014-10-15 VITALS — BP 124/70 | HR 65 | Ht 66.0 in | Wt 147.1 lb

## 2014-10-15 DIAGNOSIS — N3941 Urge incontinence: Secondary | ICD-10-CM

## 2014-10-15 DIAGNOSIS — N393 Stress incontinence (female) (male): Secondary | ICD-10-CM

## 2014-10-15 LAB — URINALYSIS, COMPLETE
Bilirubin, UA: NEGATIVE
Glucose, UA: NEGATIVE
Ketones, UA: NEGATIVE
Leukocytes, UA: NEGATIVE
Nitrite, UA: NEGATIVE
Protein, UA: NEGATIVE
Specific Gravity, UA: 1.01 (ref 1.005–1.030)
Urobilinogen, Ur: 0.2 mg/dL (ref 0.2–1.0)
pH, UA: 5 (ref 5.0–7.5)

## 2014-10-15 LAB — MICROSCOPIC EXAMINATION
Bacteria, UA: NONE SEEN
Epithelial Cells (non renal): NONE SEEN /hpf (ref 0–10)
WBC, UA: NONE SEEN /hpf (ref 0–?)

## 2014-10-15 NOTE — Progress Notes (Signed)
10/15/2014 11:26 AM   [place] 09-30-1940 [id number]  Referring provider: Ezequiel Kayser, MD [place], [place] [address]  Chief Complaint  Patient presents with  . 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 About 90% of leakage with cough, sneeze. Hypermobility and SUI demonstrated on exam. Very, very occasional urge leakage only when drinks too much caffeiene and delays voiding (much less bother) .She is s/p hyst and on oral estrogens. Pelvic 2016 with SUI / Hypermobility/ excellent apical support, no vault prolapse. PVR 2016 "21m". She has few friends who have has slings with variable results.   PMH sig for benign hyst, lung cancer (NED), HTN.  His PCP is DEzequiel KayserMD.  Today " Christy [name]" is seen as new patient for above, she is referred by Dr. TRaechel Ache    PMH: Past Medical History  Diagnosis Date  . Hypertension   . GERD (gastroesophageal reflux disease)   . Allergy   . Fatty liver disease, nonalcoholic     Surgical History: Past Surgical History  Procedure Laterality Date  . Laryngoscopy N/A 07/22/2014    Procedure: JET LARYNGOSCOPY with right vocal cord biopsy;  Surgeon: PMargaretha Sheffield MD;  Location: [facility] ORS;  Service: ENT;  Laterality: N/A;    Home Medications:    Medication List       This list is accurate as of: 10/15/14 11:26 AM.  Always use your most recent med list.               benazepril 5 MG tablet  Commonly known as:  LOTENSIN  Take 1 tablet by mouth daily.     calcium citrate-vitamin D 500-400 MG-UNIT chewable tablet  Chew 1 tablet by mouth daily.     D 2000 2000 UNITS Tabs  Generic drug:  Cholecalciferol  Take 1 tablet by mouth daily.     estradiol 1 MG tablet  Commonly known as:  ESTRACE  Take 1 tablet by mouth daily.     LIPO-FLAVONOID PLUS Tabs  Take 1 tablet by mouth daily.     Magnesium Oxide 400 MG Caps  Take 1  tablet by mouth daily.     multivitamin capsule  Take 1 capsule by mouth daily.     OCUVITE ADULT 50+ PO  Take 1 tablet by mouth daily.     omeprazole 20 MG capsule  Commonly known as:  PRILOSEC  Take 1 capsule by mouth daily as needed.     zoledronic acid 5 MG/100ML Soln injection  Commonly known as:  RECLAST  Inject into the vein.        Allergies:  Allergies  Allergen Reactions  . Alendronate Other (See Comments)    Esophageal burning  . Sulfa Antibiotics Other (See Comments)  . Ciprofloxacin Other (See Comments)    Other Reaction: GI UPSET  . Diphenhydramine Other (See Comments)  . Fluticasone Propionate Other (See Comments)    epistaxis    Family History: No family history on file.  Social History:  reports that she quit smoking about 8 years ago. Her smoking use included Cigarettes. She has a 12.5 pack-year smoking history. She has never used smokeless tobacco. She reports that she drinks about 1.2 oz of alcohol per week. She reports that she does not use illicit drugs.  ROS:      Urological Symptom Review  Patient is experiencing the  following symptoms: Hard to postpone urination   Review of Systems  Gastrointestinal (upper)  : Negative for upper GI symptoms  Gastrointestinal (lower) : Negative for lower GI symptoms  Constitutional : Negative for symptoms  Skin: Negative for skin symptoms  Eyes: Negative for eye symptoms  Ear/Nose/Throat : Negative for Ear/Nose/Throat symptoms  Hematologic/Lymphatic: Negative for Hematologic/Lymphatic symptoms  Cardiovascular : Negative for cardiovascular symptoms  Respiratory : Negative for respiratory symptoms  Endocrine: Negative for endocrine symptoms  Musculoskeletal: Negative for musculoskeletal symptoms  Neurological: Negative for neurological symptoms  Psychologic: Negative for psychiatric symptoms                                    Physical Exam: There were  no vitals taken for this visit.  Constitutional:  Alert and oriented, No acute distress. HEENT: [place] AT, moist mucus membranes.  Trachea midline, no masses. Cardiovascular: No clubbing, cyanosis, or edema. Respiratory: Normal respiratory effort, no increased work of breathing. GI: Abdomen is soft, nontender, nondistended, no abdominal masses GU: No CVA tenderness. Pelvic with Chaparone Shon Baton) with absent cervix, excellent vault support. Mod hypermobilty with SUI with cough.  Skin: No rashes, bruises or suspicious lesions. Lymph: No cervical or inguinal adenopathy. Neurologic: Grossly intact, no focal deficits, moving all 4 extremities. Psychiatric: Normal mood and affect.  Laboratory Data: Lab Results  Component Value Date   WBC 12.2* 05/03/2014   HGB 10.8* 05/03/2014   HCT 33.2* 05/03/2014   MCV 92 05/03/2014   PLT 163 05/03/2014    Lab Results  Component Value Date   CREATININE 0.65 05/03/2014    No results found for: PSA  No results found for: TESTOSTERONE  No results found for: HGBA1C  Urinalysis    Component Value Date/Time   COLORURINE ORANGE 03/18/2012 1057   APPEARANCEUR CLOUDY 03/18/2012 1057   LABSPEC see comment 03/18/2012 1057   PHURINE see comment 03/18/2012 1057   GLUCOSEU see comment 03/18/2012 1057   HGBUR see comment 03/18/2012 1057   BILIRUBINUR see comment 03/18/2012 1057   KETONESUR see comment 03/18/2012 1057   PROTEINUR see comment 03/18/2012 1057   NITRITE SEE COMMENT 03/18/2012 1057   LEUKOCYTESUR see comment 03/18/2012 1057    Pertinent Imaging:  Assessment & Plan:    1 - Stress Predominant Mixed Urinary Incontinence - I feel pt would ned midurethral sling or similar to achive complete dryness. Her urge component is minimal and I do not feel that oral meds would substantially help. Discussed risks / benefits / alternatives to midurethral sling and at this point pt wants to hold off. If level of leakage was to signifiacntly worsen, she may  want to persue. This is reasonable as she empties well.   No Follow-up on file.  Alexis Frock, [facility] [address], [place], [place] [address] [phone]

## 2014-11-25 ENCOUNTER — Ambulatory Visit
Admission: RE | Admit: 2014-11-25 | Discharge: 2014-11-25 | Disposition: A | Discharge status: Home / Self Care | Payer: Medicare Other | Source: Ambulatory Visit | Attending: Oncology | Admitting: Oncology

## 2014-11-25 DIAGNOSIS — C3491 Malignant neoplasm of unspecified part of right bronchus or lung: Secondary | ICD-10-CM | POA: Insufficient documentation

## 2014-11-25 DIAGNOSIS — C3490 Malignant neoplasm of unspecified part of unspecified bronchus or lung: Secondary | ICD-10-CM

## 2014-11-25 MED ORDER — IOHEXOL 300 MG/ML  SOLN
75.0000 mL | Freq: Once | INTRAMUSCULAR | Status: AC | PRN
Start: 2014-11-25 — End: 2014-11-25
  Administered 2014-11-25: 75 mL via INTRAVENOUS

## 2014-11-26 ENCOUNTER — Inpatient Hospital Stay: Payer: Medicare Other | Attending: Oncology | Admitting: Oncology

## 2014-11-26 VITALS — BP 139/78 | HR 78 | Temp 97.0°F | Wt 148.5 lb

## 2014-11-26 DIAGNOSIS — I2510 Atherosclerotic heart disease of native coronary artery without angina pectoris: Secondary | ICD-10-CM | POA: Diagnosis not present

## 2014-11-26 DIAGNOSIS — Z809 Family history of malignant neoplasm, unspecified: Secondary | ICD-10-CM | POA: Insufficient documentation

## 2014-11-26 DIAGNOSIS — F329 Major depressive disorder, single episode, unspecified: Secondary | ICD-10-CM | POA: Diagnosis not present

## 2014-11-26 DIAGNOSIS — Z85118 Personal history of other malignant neoplasm of bronchus and lung: Secondary | ICD-10-CM | POA: Diagnosis not present

## 2014-11-26 DIAGNOSIS — K760 Fatty (change of) liver, not elsewhere classified: Secondary | ICD-10-CM | POA: Diagnosis not present

## 2014-11-26 DIAGNOSIS — I10 Essential (primary) hypertension: Secondary | ICD-10-CM | POA: Diagnosis not present

## 2014-11-26 DIAGNOSIS — K219 Gastro-esophageal reflux disease without esophagitis: Secondary | ICD-10-CM | POA: Diagnosis not present

## 2014-11-26 DIAGNOSIS — Z87891 Personal history of nicotine dependence: Secondary | ICD-10-CM | POA: Diagnosis not present

## 2014-11-26 DIAGNOSIS — Z79899 Other long term (current) drug therapy: Secondary | ICD-10-CM

## 2014-11-26 DIAGNOSIS — C3411 Malignant neoplasm of upper lobe, right bronchus or lung: Secondary | ICD-10-CM

## 2014-11-26 NOTE — Progress Notes (Signed)
Patient here for CT results. No complaints today.

## 2014-12-08 NOTE — Progress Notes (Signed)
[place]  [phone] Fax:([phone]) [phone]  ID: Christy [name] [place] OB: 07/13/1940  MR#: [id number]  KGY#:[id number]  Patient Care Team: Ezequiel Kayser, MD as PCP - General (Internal Medicine)  CHIEF COMPLAINT:  Chief Complaint  Patient presents with  . Lung Cancer    CT results    INTERVAL HISTORY: Patient returns to clinic today for further evaluation and discussion of her imaging results. She currently feels well and is asymptomatic. She has no neurologic complaints. She has a good appetite and denies weight loss. She has no chest pain, shortness of breath, cough, or hemoptysis. She denies any nausea, vomiting, constipation, or diarrhea. She has no urinary complaints. Patient offers no specific complaints today.  REVIEW OF SYSTEMS:   Review of Systems  Constitutional: Negative.   Respiratory: Negative.  Negative for shortness of breath.   Cardiovascular: Negative.  Negative for chest pain.  Gastrointestinal: Negative.   Musculoskeletal: Negative.   Neurological: Negative.     As per HPI. Otherwise, a complete review of systems is negatve.  PAST MEDICAL HISTORY: Past Medical History  Diagnosis Date  . Hypertension   . GERD (gastroesophageal reflux disease)   . Allergy   . Fatty liver disease, nonalcoholic   . Depression   . Lung cancer ([place])     PAST SURGICAL HISTORY: Past Surgical History  Procedure Laterality Date  . Laryngoscopy N/A 07/22/2014    Procedure: JET LARYNGOSCOPY with right vocal cord biopsy;  Surgeon: Margaretha Sheffield, MD;  Location: [facility] ORS;  Service: ENT;  Laterality: N/A;  . Laparoscopic hysterectomy  1977  . Lung cancer surgery      FAMILY HISTORY Family History  Problem Relation Age of Onset  . Kidney cancer Cousin   . Cancer Maternal Grandfather   . Cancer Maternal Grandmother        ADVANCED DIRECTIVES:    HEALTH MAINTENANCE: Social History  Substance Use Topics  . Smoking status: Former Smoker -- 0.50  packs/day for 25 years    Types: Cigarettes    Quit date: 07/18/2006  . Smokeless tobacco: Never Used  . Alcohol Use: 1.2 oz/week    1 Glasses of wine, 1 Cans of beer per week     Comment: occassionally about 4 times a year     Colonoscopy:  PAP:  Bone density:  Lipid panel:  Allergies  Allergen Reactions  . Alendronate Other (See Comments)    Esophageal burning  . Sulfa Antibiotics Other (See Comments)  . Ciprofloxacin Other (See Comments)    Other Reaction: GI UPSET  . Diphenhydramine Other (See Comments)  . Fluticasone Propionate Other (See Comments)    epistaxis    Current Outpatient Prescriptions  Medication Sig Dispense Refill  . benazepril (LOTENSIN) 5 MG tablet Take 1 tablet by mouth daily.    . calcium citrate-vitamin D 500-400 MG-UNIT chewable tablet Chew 1 tablet by mouth daily.    . Cholecalciferol (D 2000) 2000 UNITS TABS Take 1 tablet by mouth daily.    Marland Kitchen estradiol (ESTRACE) 1 MG tablet Take 1 tablet by mouth daily.    . Multiple Vitamins-Minerals (OCUVITE ADULT 50+ PO) Take 1 tablet by mouth daily.    . Vitamins-Lipotropics (LIPO-FLAVONOID PLUS) TABS Take 1 tablet by mouth daily.     No current facility-administered medications for this visit.    OBJECTIVE: Filed Vitals:   11/26/14 1456  BP: 139/78  Pulse: 78  Temp: 97 F (36.1 C)     Body mass index is 23.98 kg/(m^2).  ECOG FS:0 - Asymptomatic  General: Well-developed, well-nourished, no acute distress. Eyes: Pink conjunctiva, anicteric sclera. Lungs: Clear to auscultation bilaterally. Heart: Regular rate andstended. No organomegaly noted, normoactive bowel sounds. Musculoskeletal: No edema, cyanosis, or clubbing. Neuro: Alert, answering all questions appropriately. Cranial nerves grossly intact. Skin: No rashes or petechiae noted. Psych: Normal affect.   LAB RESULTS:  Lab Results  Component Value Date   NA 136 05/03/2014   K 3.6 05/03/2014   CL 104 05/03/2014   CO2 27 05/03/2014    GLUCOSE 112* 05/03/2014   BUN 8 05/03/2014   CREATININE 0.65 05/03/2014   CALCIUM 8.0* 05/03/2014   PROT 8.5* 03/18/2012   ALBUMIN 4.1 03/18/2012   AST 26 03/18/2012   ALT 25 03/18/2012   ALKPHOS 94 03/18/2012   BILITOT 1.2* 03/18/2012   GFRNONAA >60 05/03/2014   GFRAA >60 05/03/2014    Lab Results  Component Value Date   WBC 12.2* 05/03/2014   NEUTROABS 9.7* 05/03/2014   HGB 10.8* 05/03/2014   HCT 33.2* 05/03/2014   MCV 92 05/03/2014   PLT 163 05/03/2014     STUDIES: Ct Chest W Contrast  11/25/2014  CLINICAL DATA:  Subsequent encounter for right-sided lung cancer, status post partial resection. EXAM: CT CHEST WITH CONTRAST TECHNIQUE: Multidetector CT imaging of the chest was performed during intravenous contrast administration. CONTRAST:  35m OMNIPAQUE IOHEXOL 300 MG/ML  SOLN COMPARISON:  03/13/2014 chest CT.  PET-CT from 04/04/2014. FINDINGS: Mediastinum / Lymph Nodes: There is no axillary lymphadenopathy. Bilateral breast prostheses are evident. Thyroid gland is unremarkable. No mediastinal lymphadenopathy. There is no hilar lymphadenopathy. The heart size is normal. No pericardial effusion. Coronary artery calcification is noted. Lungs / Pleura: Patient is status post right upper lobectomy. Suture line/scar visible in the medial right lung. A cluster of tiny 2-3 mm nodules is seen posteriorly and laterally in the right middle lobe, unchanged in appearance in the interval although positioning in the hemi thorax is altered due to right upper lobe resection. Probable scarring noted at the right lung base (image 46 series 3). No pulmonary edema. No pleural effusion. No focal airspace consolidation. MSK / Soft Tissues: Bone windows reveal no worrisome lytic or sclerotic osseous lesions. Upper Abdomen: No focal abnormality within the visualized portions of the liver or spleen. No adrenal nodule or mass. IMPRESSION: 1. Interval right upper lobectomy without features to suggest recurrent or  metastatic disease. 2. New opacity at the right lung base likely related to the atelectasis or scarring. Attention to this area on followup is recommended. 3. Stable appearance of posterior right middle lobe pulmonary nodules. Electronically Signed   By: EMisty StanleyM.D.   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  PLAN:    1. Lung cancer: Pathology and surgical report reviewed independently.  CT scan results reviewed independently and reported as above with no evidence of recurrence.  Patient's surgery was on April 30, 2014.  Will restage patient every 6 months for 2 years after her surgery and then yearly after that for 5. Return to clinic in 6 months for repeat imaging and further evaluation.  Patient expressed understanding and was in agreement with this plan. She also understands that She can call clinic at any time with any questions, concerns, or complaints.    TLloyd Huger MD   12/08/2014 11:03 PM

## 2015-02-01 ENCOUNTER — Emergency Department
Admission: EM | Admit: 2015-02-01 | Discharge: 2015-02-02 | Disposition: A | Discharge status: Home Health | Payer: Medicare Other | Attending: Emergency Medicine | Admitting: Emergency Medicine

## 2015-02-01 ENCOUNTER — Emergency Department: Payer: Medicare Other

## 2015-02-01 DIAGNOSIS — N183 Chronic kidney disease, stage 3 (moderate): Secondary | ICD-10-CM | POA: Insufficient documentation

## 2015-02-01 DIAGNOSIS — W19XXXA Unspecified fall, initial encounter: Secondary | ICD-10-CM

## 2015-02-01 DIAGNOSIS — Y9389 Activity, other specified: Secondary | ICD-10-CM | POA: Insufficient documentation

## 2015-02-01 DIAGNOSIS — I129 Hypertensive chronic kidney disease with stage 1 through stage 4 chronic kidney disease, or unspecified chronic kidney disease: Secondary | ICD-10-CM | POA: Insufficient documentation

## 2015-02-01 DIAGNOSIS — W07XXXA Fall from chair, initial encounter: Secondary | ICD-10-CM | POA: Insufficient documentation

## 2015-02-01 DIAGNOSIS — S52611A Displaced fracture of right ulna styloid process, initial encounter for closed fracture: Secondary | ICD-10-CM | POA: Insufficient documentation

## 2015-02-01 DIAGNOSIS — Y9289 Other specified places as the place of occurrence of the external cause: Secondary | ICD-10-CM | POA: Diagnosis not present

## 2015-02-01 DIAGNOSIS — S52591A Other fractures of lower end of right radius, initial encounter for closed fracture: Secondary | ICD-10-CM | POA: Diagnosis not present

## 2015-02-01 DIAGNOSIS — Z87891 Personal history of nicotine dependence: Secondary | ICD-10-CM | POA: Diagnosis not present

## 2015-02-01 DIAGNOSIS — Z79899 Other long term (current) drug therapy: Secondary | ICD-10-CM | POA: Insufficient documentation

## 2015-02-01 DIAGNOSIS — S6991XA Unspecified injury of right wrist, hand and finger(s), initial encounter: Secondary | ICD-10-CM | POA: Diagnosis present

## 2015-02-01 DIAGNOSIS — Y998 Other external cause status: Secondary | ICD-10-CM | POA: Diagnosis not present

## 2015-02-01 DIAGNOSIS — S62101A Fracture of unspecified carpal bone, right wrist, initial encounter for closed fracture: Secondary | ICD-10-CM

## 2015-02-01 NOTE — ED Notes (Signed)
Pt states was standing on a dining room chair putting dishes away when she fell onto right hand while standing on chair.  Pt denies loc, denies strking head. Pt with swelling and ecchymosis noted to right wrist. Cms intact to right fingers, ice applied in triage.

## 2015-02-02 MED ORDER — ONDANSETRON 4 MG PO TBDP
4.0000 mg | ORAL_TABLET | Freq: Once | ORAL | Status: AC
  Administered 2015-02-02: 4 mg via ORAL
  Filled 2015-02-02: qty 1

## 2015-02-02 MED ORDER — IBUPROFEN 600 MG PO TABS: 600.0000 mg | ORAL_TABLET | Freq: Three times a day (TID) | ORAL | Status: DC | PRN

## 2015-02-02 MED ORDER — OXYCODONE-ACETAMINOPHEN 5-325 MG PO TABS: 1.0000 | ORAL_TABLET | ORAL | Status: DC | PRN

## 2015-02-02 MED ORDER — OXYCODONE-ACETAMINOPHEN 5-325 MG PO TABS
1.0000 | ORAL_TABLET | Freq: Once | ORAL | Status: AC
  Administered 2015-02-02: 1 via ORAL
  Filled 2015-02-02: qty 1

## 2015-02-02 MED ORDER — IBUPROFEN 600 MG PO TABS
600.0000 mg | ORAL_TABLET | Freq: Once | ORAL | Status: AC
  Administered 2015-02-02: 600 mg via ORAL
  Filled 2015-02-02: qty 1

## 2015-02-02 NOTE — ED Notes (Signed)
Right wrist

## 2015-02-02 NOTE — ED Provider Notes (Signed)
[facility] Emergency Department Provider Note  ____________________________________________  Time seen: Approximately 12:04 AM  I have reviewed the triage vital signs and the nursing notes.   HISTORY  Chief Complaint Wrist Injury and Arm Injury    HPI Marianny Goris is a 74 y.o. female sensitive the ED from home with a chief complaint of right wrist pain. Patient reports losing her balance, falling backwards off her chair landing onto her outstretched right arm (dominant hand). Denies striking head or LOC. Denies neck pain, chest pain, shortness of breath, abdominal pain, nausea, vomiting, diarrhea, numbness, tingling. Denies use of anticoagulants. Nothing makes the pain better. Movement makes the pain worse.   Past Medical History  Diagnosis Date  . Hypertension   . GERD (gastroesophageal reflux disease)   . Allergy   . Fatty liver disease, nonalcoholic   . Depression   . Lung cancer [facility])     Patient Active Problem List   Diagnosis Date Noted  . SUI (stress urinary incontinence, female) 10/15/2014  . Polypharmacy 09/02/2014  . Adenocarcinoma of lung ([place]) 07/19/2014  . Acid reflux 07/18/2014  . 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 Chronic kidney disease (CKD), stage III (moderate) 08/30/2013  . Benign essential HTN 08/30/2013  . Fatty liver disease, nonalcoholic 29/56/2130  . Fibrositis 08/30/2013  . Hot flash, menopausal 08/30/2013  . Billowing mitral valve 08/30/2013  . Allergic rhinitis, seasonal 08/30/2013  . Fibromyalgia 08/30/2013    Past Surgical History  Procedure Laterality Date  . Laryngoscopy N/A 07/22/2014    Procedure: JET LARYNGOSCOPY with right vocal cord biopsy;  Surgeon: Margaretha Sheffield, MD;  Location: [facility] ORS;  Service: ENT;  Laterality: N/A;  . Laparoscopic hysterectomy  1977  . Lung cancer surgery      Current Outpatient Rx  Name  Route  Sig  Dispense  Refill  . benazepril  (LOTENSIN) 5 MG tablet   Oral   Take 1 tablet by mouth daily.         . calcium citrate-vitamin D 500-400 MG-UNIT chewable tablet   Oral   Chew 1 tablet by mouth daily.         . Cholecalciferol (D 2000) 2000 UNITS TABS   Oral   Take 1 tablet by mouth daily.         Marland Kitchen estradiol (ESTRACE) 1 MG tablet   Oral   Take 1 tablet by mouth daily.         . Multiple Vitamins-Minerals (OCUVITE ADULT 50+ PO)   Oral   Take 1 tablet by mouth daily.         . Vitamins-Lipotropics (LIPO-FLAVONOID PLUS) TABS   Oral   Take 1 tablet by mouth daily.           Allergies Alendronate; Sulfa antibiotics; Ciprofloxacin; and Diphenhydramine  Family History  Problem Relation Age of Onset  . Kidney cancer Cousin   . Cancer Maternal Grandfather   . Cancer Maternal Grandmother     Social History Social History  Substance Use Topics  . Smoking status: Former Smoker -- 0.50 packs/day for 25 years    Types: Cigarettes    Quit date: 07/18/2006  . Smokeless tobacco: Never Used  . Alcohol Use: 1.2 oz/week    1 Glasses of wine, 1 Cans of beer per week     Comment: occassionally about 4 times a year    Review of Systems Constitutional: No fever/chills Eyes: No visual changes. ENT: No sore throat. Cardiovascular:  Denies chest pain. Respiratory: Denies shortness of breath. Gastrointestinal: No abdominal pain.  No nausea, no vomiting.  No diarrhea.  No constipation. Genitourinary: Negative for dysuria. Musculoskeletal: Positive for right wrist pain. Negative for back pain. Skin: Negative for rash. Neurological: Negative for headaches, focal weakness or numbness.  10-point ROS otherwise negative.  ____________________________________________   PHYSICAL EXAM:  VITAL SIGNS: ED Triage Vitals  Enc Vitals Group     BP 02/01/15 2233 169/67 mmHg     Pulse Rate 02/01/15 2233 66     Resp 02/01/15 2233 14     Temp 02/01/15 2233 98.4 F (36.9 C)     Temp Source 02/01/15 2233 Oral      SpO2 02/01/15 2233 100 %     Weight 02/01/15 2233 151 lb (68.493 kg)     Height 02/01/15 2233 '5\' 6"'$  (1.676 m)     Head Cir --      Peak Flow --      Pain Score 02/01/15 2234 5     Pain Loc --      Pain Edu? --      Excl. in [place]? --     Constitutional: Alert and oriented. Well appearing and in no acute distress. Eyes: Conjunctivae are normal. PERRL. EOMI. Head: Atraumatic. Nose: No congestion/rhinnorhea. Mouth/Throat: Mucous membranes are moist.  Oropharynx non-erythematous. Neck: No stridor.  No cervical spine tenderness to palpation. Cardiovascular: Normal rate, regular rhythm. Grossly normal heart sounds.  Good peripheral circulation. Respiratory: Normal respiratory effort.  No retractions. Lungs CTAB. Gastrointestinal: Soft and nontender. No distention. No abdominal bruits. No CVA tenderness. Musculoskeletal: Right distal dorsal wrist with mild swelling and tenderness to palpation. Decreased range of motion secondary to pain. Equal hand grips. 2+ radial pulses. Brisk, less than 5 second capillary refill. No lower extremity tenderness nor edema.  No joint effusions. Neurologic:  Normal speech and language. No gross focal neurologic deficits are appreciated. No gait instability. Skin:  Skin is warm, dry and intact. No rash noted. Psychiatric: Mood and affect are normal. Speech and behavior are normal.  ____________________________________________   LABS (all labs ordered are listed, but only abnormal results are displayed)  Labs Reviewed - No data to display ____________________________________________  EKG  None ____________________________________________  RADIOLOGY  Right wrist complete (viewed by me, interpreted per Dr. Dorann Lodge): Acute nondisplaced distal radial fracture. Acute displaced ulnar styloid fracture. No dislocation. ____________________________________________   PROCEDURES  Procedure(s) performed:   SPLINT APPLICATION Date/Time: 52:84 AM Authorized  by: Paulette Blanch Consent: Verbal consent obtained. Risks and benefits: risks, benefits and alternatives were discussed Consent given by: patient Splint applied by: orthopedic technician Location details: right wrist Splint type: thumb spica Supplies used: OCL Post-procedure: The splinted body part was neurovascularly unchanged following the procedure. Patient tolerance: Patient tolerated the procedure well with no immediate complications.    Critical Care performed: No  ____________________________________________   INITIAL IMPRESSION / ASSESSMENT AND PLAN / ED COURSE  Pertinent labs & imaging results that were available during my care of the patient were reviewed by me and considered in my medical decision making (see chart for details).  74 year old female who presents s/p mechanical fall with right wrist fracture. Administer analgesia, splint, advised RICE and follow-up with orthopedics next week. Strict return precautions given. Patient's spouse verbalize understanding and agree with plan of care. ____________________________________________   FINAL CLINICAL IMPRESSION(S) / ED DIAGNOSES  Final diagnoses:  Right wrist fracture, closed, initial encounter  Fall, initial encounter      Jade  Jamal Collin, MD 02/02/15 [phone]

## 2015-02-02 NOTE — ED Notes (Signed)
Pt c/o pain to right wrist after injury. Pt reports fall backwards off of chair landing on out stretched right arm. Pt reports immediate pain felt in right wrist.    02/02/15 0000  Musculoskeletal  Musculoskeletal (WDL) X  RUE Deformity;Swelling;Injury/trauma

## 2015-02-02 NOTE — Discharge Instructions (Signed)
1. Take pain medicines as needed (Motrin/Percocet). 2. Keep splint clean and dry. Elevate affected area and apply ice over splint several times daily. 3. Return to the ER for worsening symptoms, persistent vomiting, numbness/tingling or other concerns.  Wrist Fracture A wrist fracture is a break or crack in one of the bones of your wrist. Your wrist is made up of eight small bones at the palm of your hand (carpal bones) and two long bones that make up your forearm (radius and ulna). CAUSES  A direct blow to the wrist.  Falling on an outstretched hand.  Trauma, such as a car accident or a fall. RISK FACTORS Risk factors for wrist fracture include:  Participating in contact and high-risk sports, such as skiing, biking, and ice skating.  Taking steroid medicines.  Smoking.  Being female.  Being Caucasian.  Drinking more than three alcoholic beverages per day.  Having low or lowered bone density (osteoporosis or osteopenia).  Age. Older adults have decreased bone density.  Women who have had menopause.  History of previous fractures. SIGNS AND SYMPTOMS Symptoms of wrist fractures include tenderness, bruising, and inflammation. Additionally, the wrist may hang in an odd position or appear deformed. DIAGNOSIS Diagnosis may include:  Physical exam.  X-ray. TREATMENT Treatment depends on many factors, including the nature and location of the fracture, your age, and your activity level. Treatment for wrist fracture can be nonsurgical or surgical. Nonsurgical Treatment A plaster cast or splint may be applied to your wrist if the bone is in a good position. If the fracture is not in good position, it may be necessary for your health care provider to realign it before applying a splint or cast. Usually, a cast or splint will be worn for several weeks. Surgical Treatment Sometimes the position of the bone is so far out of place that surgery is required to apply a device to hold it  together as it heals. Depending on the fracture, there are a number of options for holding the bone in place while it heals, such as a cast and metal pins. HOME CARE INSTRUCTIONS  Keep your injured wrist elevated and move your fingers as much as possible.  Do not put pressure on any part of your cast or splint. It may break.  Use a plastic bag to protect your cast or splint from water while bathing or showering. Do not lower your cast or splint into water.  Take medicines only as directed by your health care provider.  Keep your cast or splint clean and dry. If it becomes wet, damaged, or suddenly feels too tight, contact your health care provider right away.  Do not use any tobacco products including cigarettes, chewing tobacco, or electronic cigarettes. Tobacco can delay bone healing. If you need help quitting, ask your health care provider.  Keep all follow-up visits as directed by your health care provider. This is important.  Ask your health care provider if you should take supplements of calcium and vitamins C and D to promote bone healing. SEEK MEDICAL CARE IF:  Your cast or splint is damaged, breaks, or gets wet.  You have a fever.  You have chills.  You have continued severe pain or more swelling than you did before the cast was put on. SEEK IMMEDIATE MEDICAL CARE IF:  Your hand or fingernails on the injured arm turn blue or gray, or feel cold or numb.  You have decreased feeling in the fingers of your injured arm. MAKE SURE YOU:  Understand these instructions.  Will watch your condition.  Will get help right away if you are not doing well or get worse.   This information is not intended to replace advice given to you by your health care provider. Make sure you discuss any questions you have with your health care provider.   Document Released: 11/04/2004 Document Revised: 10/16/2014 Document Reviewed: 02/12/2011 Elsevier Interactive Patient Education 2016 [place] or Splint Care Casts and splints support injured limbs and keep bones from moving while they heal. It is important to care for your cast or splint at home.  HOME CARE INSTRUCTIONS  Keep the cast or splint uncovered during the drying period. It can take 24 to 48 hours to dry if it is made of plaster. A fiberglass cast will dry in less than 1 hour.  Do not rest the cast on anything harder than a pillow for the first 24 hours.  Do not put weight on your injured limb or apply pressure to the cast until your health care provider gives you permission.  Keep the cast or splint dry. Wet casts or splints can lose their shape and may not support the limb as well. A wet cast that has lost its shape can also create harmful pressure on your skin when it dries. Also, wet skin can become infected.  Cover the cast or splint with a plastic bag when bathing or when out in the rain or snow. If the cast is on the trunk of the body, take sponge baths until the cast is removed.  If your cast does become wet, dry it with a towel or a blow dryer on the cool setting only.  Keep your cast or splint clean. Soiled casts may be wiped with a moistened cloth.  Do not place any hard or soft foreign objects under your cast or splint, such as cotton, toilet paper, lotion, or powder.  Do not try to scratch the skin under the cast with any object. The object could get stuck inside the cast. Also, scratching could lead to an infection. If itching is a problem, use a blow dryer on a cool setting to relieve discomfort.  Do not trim or cut your cast or remove padding from inside of it.  Exercise all joints next to the injury that are not immobilized by the cast or splint. For example, if you have a long leg cast, exercise the hip joint and toes. If you have an arm cast or splint, exercise the shoulder, elbow, thumb, and fingers.  Elevate your injured arm or leg on 1 or 2 pillows for the first 1 to 3 days to decrease  swelling and pain.It is best if you can comfortably elevate your cast so it is higher than your heart. SEEK MEDICAL CARE IF:   Your cast or splint cracks.  Your cast or splint is too tight or too loose.  You have unbearable itching inside the cast.  Your cast becomes wet or develops a soft spot or area.  You have a bad smell coming from inside your cast.  You get an object stuck under your cast.  Your skin around the cast becomes red or raw.  You have new pain or worsening pain after the cast has been applied. SEEK IMMEDIATE MEDICAL CARE IF:   You have fluid leaking through the cast.  You are unable to move your fingers or toes.  You have discolored (blue or white), cool, painful, or very swollen  fingers or toes beyond the cast.  You have tingling or numbness around the injured area.  You have severe pain or pressure under the cast.  You have any difficulty with your breathing or have shortness of breath.  You have chest pain.   This information is not intended to replace advice given to you by your health care provider. Make sure you discuss any questions you have with your health care provider.   Document Released: 01/23/2000 Document Revised: 11/15/2012 Document Reviewed: 08/03/2012 Elsevier Interactive Patient Education [employer].

## 2015-05-27 ENCOUNTER — Ambulatory Visit
Admission: RE | Admit: 2015-05-27 | Discharge: 2015-05-27 | Disposition: A | Discharge status: Home / Self Care | Payer: Medicare Other | Source: Ambulatory Visit | Attending: Oncology | Admitting: Oncology

## 2015-05-27 DIAGNOSIS — Z902 Acquired absence of lung [part of]: Secondary | ICD-10-CM | POA: Insufficient documentation

## 2015-05-27 DIAGNOSIS — R918 Other nonspecific abnormal finding of lung field: Secondary | ICD-10-CM | POA: Diagnosis not present

## 2015-05-27 DIAGNOSIS — C3411 Malignant neoplasm of upper lobe, right bronchus or lung: Secondary | ICD-10-CM | POA: Diagnosis not present

## 2015-05-27 LAB — POCT I-STAT CREATININE: Creatinine, Ser: 0.7 mg/dL (ref 0.44–1.00)

## 2015-05-27 MED ORDER — IOPAMIDOL (ISOVUE-300) INJECTION 61%
75.0000 mL | Freq: Once | INTRAVENOUS | Status: AC | PRN
  Administered 2015-05-27: 75 mL via INTRAVENOUS

## 2015-05-29 ENCOUNTER — Inpatient Hospital Stay: Payer: Medicare Other | Attending: Oncology | Admitting: Oncology

## 2015-05-29 VITALS — BP 110/69 | HR 75 | Resp 16 | Wt 147.5 lb

## 2015-05-29 DIAGNOSIS — Z902 Acquired absence of lung [part of]: Secondary | ICD-10-CM | POA: Insufficient documentation

## 2015-05-29 DIAGNOSIS — Z8051 Family history of malignant neoplasm of kidney: Secondary | ICD-10-CM | POA: Insufficient documentation

## 2015-05-29 DIAGNOSIS — K760 Fatty (change of) liver, not elsewhere classified: Secondary | ICD-10-CM | POA: Insufficient documentation

## 2015-05-29 DIAGNOSIS — Z809 Family history of malignant neoplasm, unspecified: Secondary | ICD-10-CM | POA: Diagnosis not present

## 2015-05-29 DIAGNOSIS — C3492 Malignant neoplasm of unspecified part of left bronchus or lung: Secondary | ICD-10-CM

## 2015-05-29 DIAGNOSIS — I2699 Other pulmonary embolism without acute cor pulmonale: Secondary | ICD-10-CM

## 2015-05-29 DIAGNOSIS — Z87891 Personal history of nicotine dependence: Secondary | ICD-10-CM

## 2015-05-29 DIAGNOSIS — Z79899 Other long term (current) drug therapy: Secondary | ICD-10-CM | POA: Diagnosis not present

## 2015-05-29 DIAGNOSIS — F329 Major depressive disorder, single episode, unspecified: Secondary | ICD-10-CM | POA: Diagnosis not present

## 2015-05-29 DIAGNOSIS — I10 Essential (primary) hypertension: Secondary | ICD-10-CM | POA: Diagnosis not present

## 2015-05-29 DIAGNOSIS — R918 Other nonspecific abnormal finding of lung field: Secondary | ICD-10-CM | POA: Diagnosis not present

## 2015-05-29 DIAGNOSIS — C3411 Malignant neoplasm of upper lobe, right bronchus or lung: Secondary | ICD-10-CM | POA: Diagnosis not present

## 2015-05-29 DIAGNOSIS — K219 Gastro-esophageal reflux disease without esophagitis: Secondary | ICD-10-CM | POA: Insufficient documentation

## 2015-05-29 NOTE — Progress Notes (Signed)
[place]  Telephone:(336) [phone] Fax:(336) [phone]  ID: Christy [name] [place] OB: 1940-12-06  MR#: [id number]  ZJI#:[id number]  Patient Care Team: Ezequiel Kayser, MD as PCP - General (Internal Medicine)  CHIEF COMPLAINT:  Chief Complaint  Patient presents with  . Lung Cancer  . Results    INTERVAL HISTORY: Patient returns to clinic today for further evaluation and discussion of her imaging results. She currently feels well and is asymptomatic. She has no neurologic complaints. She has a good appetite and denies weight loss. She has no chest pain, shortness of breath, cough, or hemoptysis. She denies any nausea, vomiting, constipation, or diarrhea. She has no urinary complaints. Patient offers no specific complaints today.  REVIEW OF SYSTEMS:   Review of Systems  Constitutional: Negative.   Respiratory: Negative.  Negative for shortness of breath.   Cardiovascular: Negative.  Negative for chest pain.  Gastrointestinal: Negative.   Musculoskeletal: Negative.   Neurological: Negative.     As per HPI. Otherwise, a complete review of systems is negatve.  PAST MEDICAL HISTORY: Past Medical History  Diagnosis Date  . Hypertension   . GERD (gastroesophageal reflux disease)   . Allergy   . Fatty liver disease, nonalcoholic   . Depression   . Lung cancer ([place]) 02/2014    RUL Lobectomy    PAST SURGICAL HISTORY: Past Surgical History  Procedure Laterality Date  . Laryngoscopy N/A 07/22/2014    Procedure: JET LARYNGOSCOPY with right vocal cord biopsy;  Surgeon: Margaretha Sheffield, MD;  Location: [facility] ORS;  Service: ENT;  Laterality: N/A;  . Laparoscopic hysterectomy  1977  . Lung cancer surgery      FAMILY HISTORY Family History  Problem Relation Age of Onset  . Kidney cancer Cousin   . Cancer Maternal Grandfather   . Cancer Maternal Grandmother        ADVANCED DIRECTIVES:    HEALTH MAINTENANCE: Social History  Substance Use Topics  . Smoking status:  Former Smoker -- 0.50 packs/day for 25 years    Types: Cigarettes    Quit date: 07/18/2006  . Smokeless tobacco: Never Used  . Alcohol Use: 1.2 oz/week    1 Glasses of wine, 1 Cans of beer per week     Comment: occassionally about 4 times a year     Allergies  Allergen Reactions  . Alendronate Other (See Comments)    Esophageal burning  . Sulfa Antibiotics Other (See Comments)  . Ciprofloxacin Other (See Comments)    Other Reaction: GI UPSET  . Diphenhydramine Other (See Comments)    Current Outpatient Prescriptions  Medication Sig Dispense Refill  . benazepril (LOTENSIN) 5 MG tablet Take 1 tablet by mouth daily.    . calcium citrate-vitamin D 500-400 MG-UNIT chewable tablet Chew 1 tablet by mouth daily.    . cetirizine (ZYRTEC) 10 MG tablet Take by mouth.    . Cholecalciferol (D 2000) 2000 UNITS TABS Take 1 tablet by mouth daily.    Marland Kitchen EPIPEN 2-PAK 0.3 MG/0.3ML SOAJ injection     . estradiol (ESTRACE) 0.5 MG tablet     . ibuprofen (ADVIL,MOTRIN) 600 MG tablet Take 1 tablet (600 mg total) by mouth every 8 (eight) hours as needed. 15 tablet 0  . Multiple Vitamins-Minerals (OCUVITE ADULT 50+ PO) Take 1 tablet by mouth daily.    Marland Kitchen oxyCODONE-acetaminophen (ROXICET) 5-325 MG tablet Take 1 tablet by mouth every 4 (four) hours as needed for severe pain. 30 tablet 0  . Vitamins-Lipotropics (LIPO-FLAVONOID PLUS) TABS Take  1 tablet by mouth daily.     No current facility-administered medications for this visit.    OBJECTIVE: Filed Vitals:   05/29/15 1448  BP: 110/69  Pulse: 75  Resp: 16     Body mass index is 23.82 kg/(m^2).    ECOG FS:0 - Asymptomatic  General: Well-developed, well-nourished, no acute distress. Eyes: Pink conjunctiva, anicteric sclera. Lungs: Clear to auscultation bilaterally. Heart: Regular rate andstended. No organomegaly noted, normoactive bowel sounds. Musculoskeletal: No edema, cyanosis, or clubbing. Neuro: Alert, answering all questions appropriately.  Cranial nerves grossly intact. Skin: No rashes or petechiae noted. Psych: Normal affect.   LAB RESULTS:  Lab Results  Component Value Date   NA 136 05/03/2014   K 3.6 05/03/2014   CL 104 05/03/2014   CO2 27 05/03/2014   GLUCOSE 112* 05/03/2014   BUN 8 05/03/2014   CREATININE 0.70 05/27/2015   CALCIUM 8.0* 05/03/2014   PROT 8.5* 03/18/2012   ALBUMIN 4.1 03/18/2012   AST 26 03/18/2012   ALT 25 03/18/2012   ALKPHOS 94 03/18/2012   BILITOT 1.2* 03/18/2012   GFRNONAA >60 05/03/2014   GFRAA >60 05/03/2014    Lab Results  Component Value Date   WBC 12.2* 05/03/2014   NEUTROABS 9.7* 05/03/2014   HGB 10.8* 05/03/2014   HCT 33.2* 05/03/2014   MCV 92 05/03/2014   PLT 163 05/03/2014     STUDIES: Ct Chest W Contrast  05/27/2015  CLINICAL DATA:  Restaging lung cancer. RIGHT upper lobectomy January 2016. EXAM: CT CHEST WITH CONTRAST TECHNIQUE: Multidetector CT imaging of the chest was performed during intravenous contrast administration. CONTRAST:  45m ISOVUE-300 IOPAMIDOL (ISOVUE-300) INJECTION 61% COMPARISON:  CT 11/25/2014 FINDINGS: Mediastinum/Nodes: No axillary supraclavicular lymphadenopathy. No mediastinal or hilar lymphadenopathy. No pericardial fluid. Central pulmonary embolism. Esophagus normal. Lungs/Pleura: Postsurgical changes as the RIGHT upper lobectomy. No new nodularity. Subpleural nodule in the RIGHT middle lobe measures 4 mm (image 74, series 3) compared to 2 mm. Nodular pleural thickening in the most inferior RIGHT lower lobe over the diaphragm (image 108, series 3) is not changed. Subpleural nodularity in the medial RIGHT lower lobe measuring 5 mm on image 88, series 3 is new from prior. Nodule within the RIGHT lower lobe measuring 6 mm on image 114, series 3 is not changed. No suspicious LEFT pulmonary nodules. Upper abdomen: Limited view of the liver, kidneys, pancreas are unremarkable. Normal adrenal glands. Musculoskeletal: No aggressive osseous lesion. IMPRESSION:  1. Stable postsurgical change the RIGHT hemi thorax. 2. Several subpleural and parenchymal nodules in the RIGHT lung. One subpleural nodule measures larger and one is new. Recommend attention on routine follow-up of these RIGHT pulmonary nodules. Electronically Signed   By: SSuzy BouchardM.D.   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  PLAN:    1. Lung cancer: Pathology and surgical report reviewed independently.  CT scan results reviewed independently and reported as above with one new nodule measuring 5 mm and one nodule increased in size from 2-4 mm.  we will continue to watch these nodules closely. They are too small to biopsy and under the sensitivity for a PET scan. Patient's surgery was on April 30, 2014.  Will restage patient every 6 months for 2 years after her surgery and then yearly after that for 5. Return to clinic in 6 months for repeat imaging and further evaluation.  Patient expressed understanding and was in agreement with this plan. She also understands that She can  call clinic at any time with any questions, concerns, or complaints.    Mayra Reel, NP   05/29/2015 3:01 PM  Patient was seen and evaluated independently and I agree with the assessment and plan as dictated above.  Lloyd Huger, MD 06/01/2015 8:03 PM

## 2015-05-29 NOTE — Progress Notes (Signed)
Patient does not offer any problems today.  

## 2015-11-25 ENCOUNTER — Ambulatory Visit
Admission: RE | Admit: 2015-11-25 | Discharge: 2015-11-25 | Disposition: A | Discharge status: Home / Self Care | Payer: Medicare Other | Source: Ambulatory Visit | Attending: Oncology | Admitting: Oncology

## 2015-11-25 DIAGNOSIS — Z902 Acquired absence of lung [part of]: Secondary | ICD-10-CM | POA: Insufficient documentation

## 2015-11-25 DIAGNOSIS — I700 Atherosclerosis of aorta: Secondary | ICD-10-CM | POA: Insufficient documentation

## 2015-11-25 DIAGNOSIS — C3492 Malignant neoplasm of unspecified part of left bronchus or lung: Secondary | ICD-10-CM

## 2015-11-25 DIAGNOSIS — R918 Other nonspecific abnormal finding of lung field: Secondary | ICD-10-CM | POA: Insufficient documentation

## 2015-11-25 DIAGNOSIS — I348 Other nonrheumatic mitral valve disorders: Secondary | ICD-10-CM | POA: Diagnosis not present

## 2015-11-25 DIAGNOSIS — I2510 Atherosclerotic heart disease of native coronary artery without angina pectoris: Secondary | ICD-10-CM | POA: Diagnosis not present

## 2015-11-25 LAB — POCT I-STAT CREATININE: Creatinine, Ser: 0.9 mg/dL (ref 0.44–1.00)

## 2015-11-25 MED ORDER — IOPAMIDOL (ISOVUE-300) INJECTION 61%
75.0000 mL | Freq: Once | INTRAVENOUS | Status: AC | PRN
  Administered 2015-11-25: 75 mL via INTRAVENOUS

## 2015-11-26 NOTE — Progress Notes (Signed)
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  INTERVAL HISTORY: Patient returns to clinic today for further evaluation and discussion of her imaging results. She currently feels well and is asymptomatic. She has no neurologic complaints. She has a good appetite and denies weight loss. She has no chest pain, shortness of breath, cough, or hemoptysis. She denies any nausea, vomiting, constipation, or diarrhea. She has no urinary complaints. Patient offers no specific complaints today.  REVIEW OF SYSTEMS:   Review of Systems  Constitutional: Negative.  Negative for fever, malaise/fatigue and weight loss.  Respiratory: Negative.  Negative for cough, hemoptysis and shortness of breath.   Cardiovascular: Negative.  Negative for chest pain and leg swelling.  Gastrointestinal: Negative.  Negative for abdominal pain.  Genitourinary: Negative.   Musculoskeletal: Negative.   Neurological: Negative.  Negative for weakness.  Psychiatric/Behavioral: Negative.  The patient is not nervous/anxious.     As per HPI. Otherwise, a complete review of systems is negative.  PAST MEDICAL HISTORY: Past Medical History:  Diagnosis Date  . Allergy   . Depression   . Fatty liver disease, nonalcoholic   . GERD (gastroesophageal reflux disease)   . Hypertension   . Lung cancer ([place]) 02/2014   RUL Lobectomy    PAST SURGICAL HISTORY: Past Surgical History:  Procedure Laterality Date  . LAPAROSCOPIC HYSTERECTOMY  1977  . LARYNGOSCOPY N/A 07/22/2014   Procedure: JET LARYNGOSCOPY with right vocal cord biopsy;  Surgeon: Margaretha Sheffield, MD;  Location: [facility] ORS;  Service: ENT;  Laterality: N/A;  . LUNG CANCER SURGERY      FAMILY HISTORY Family History  Problem  Relation Age of Onset  . Kidney cancer Cousin   . Cancer Maternal Grandfather   . Cancer Maternal Grandmother        ADVANCED DIRECTIVES:    HEALTH MAINTENANCE: Social History  Substance Use Topics  . Smoking status: Former Smoker    Packs/day: 0.50    Years: 25.00    Types: Cigarettes    Quit date: 07/18/2006  . Smokeless tobacco: Never Used  . Alcohol use 1.2 oz/week    1 Glasses of wine, 1 Cans of beer per week     Comment: occassionally about 4 times a year     Allergies  Allergen Reactions  . Alendronate Other (See Comments)    Esophageal burning  . Sulfa Antibiotics Other (See Comments)  . Ciprofloxacin Other (See Comments)    Other Reaction: GI UPSET  . Diphenhydramine Other (See Comments)    Current Outpatient Prescriptions  Medication Sig Dispense Refill  . benazepril (LOTENSIN) 5 MG tablet Take 1 tablet by mouth daily.    . calcium citrate-vitamin D 500-400 MG-UNIT chewable tablet Chew 1 tablet by mouth daily.    . cetirizine (ZYRTEC) 10 MG tablet Take by mouth.    . Cholecalciferol (D 2000) 2000 UNITS TABS Take 1 tablet by mouth daily.    Marland Kitchen EPIPEN 2-PAK 0.3 MG/0.3ML SOAJ injection     . estradiol (ESTRACE) 0.5 MG tablet     . ibuprofen (ADVIL,MOTRIN) 600 MG tablet Take 1 tablet (600 mg total) by mouth every 8 (eight) hours as needed. 15 tablet 0  . Multiple Vitamins-Minerals (OCUVITE ADULT 50+ PO) Take 1 tablet by mouth daily.    Marland Kitchen  oxyCODONE-acetaminophen (ROXICET) 5-325 MG tablet Take 1 tablet by mouth every 4 (four) hours as needed for severe pain. 30 tablet 0  . Vitamins-Lipotropics (LIPO-FLAVONOID PLUS) TABS Take 1 tablet by mouth daily.     No current facility-administered medications for this visit.     OBJECTIVE: Vitals:   11/27/15 1436  BP: (!) 141/79  Pulse: 77  Resp: 18  Temp: 98 F (36.7 C)     Body mass index is 24.86 kg/m.    ECOG FS:0 - Asymptomatic  General: Well-developed, well-nourished, no acute distress. Eyes: Pink  conjunctiva, anicteric sclera. Lungs: Clear to auscultation bilaterally. Heart: Regular rate andstended. No organomegaly noted, normoactive bowel sounds. Musculoskeletal: No edema, cyanosis, or clubbing. Neuro: Alert, answering all questions appropriately. Cranial nerves grossly intact. Skin: No rashes or petechiae noted. Psych: Normal affect.   LAB RESULTS:  Lab Results  Component Value Date   NA 136 05/03/2014   K 3.6 05/03/2014   CL 104 05/03/2014   CO2 27 05/03/2014   GLUCOSE 112 (H) 05/03/2014   BUN 8 05/03/2014   CREATININE 0.90 11/25/2015   CALCIUM 8.0 (L) 05/03/2014   PROT 8.5 (H) 03/18/2012   ALBUMIN 4.1 03/18/2012   AST 26 03/18/2012   ALT 25 03/18/2012   ALKPHOS 94 03/18/2012   BILITOT 1.2 (H) 03/18/2012   GFRNONAA >60 05/03/2014   GFRAA >60 05/03/2014    Lab Results  Component Value Date   WBC 12.2 (H) 05/03/2014   NEUTROABS 9.7 (H) 05/03/2014   HGB 10.8 (L) 05/03/2014   HCT 33.2 (L) 05/03/2014   MCV 92 05/03/2014   PLT 163 05/03/2014     STUDIES: Ct Chest W Contrast  Result Date: 11/25/2015 CLINICAL DATA:  75 year old female with history of right-sided lung cancer status post right upper lobectomy. Intermittent cough and shortness of breath. EXAM: CT CHEST WITH CONTRAST TECHNIQUE: Multidetector CT imaging of the chest was performed during intravenous contrast administration. CONTRAST:  7m ISOVUE-300 IOPAMIDOL (ISOVUE-300) INJECTION 61% COMPARISON:  Chest CT 05/27/2015. FINDINGS: Cardiovascular: Heart size is mildly enlarged with left atrial dilatation. There is no significant pericardial fluid, thickening or pericardial calcification. There is aortic atherosclerosis, as well as atherosclerosis of the great vessels of the mediastinum and the coronary arteries, including calcified atherosclerotic plaque in the left main, left anterior descending and right coronary arteries. Thickening and calcification of the mitral valve. Mediastinum/Nodes: No  pathologically enlarged mediastinal or hilar lymph nodes. Esophagus is unremarkable in appearance. No axillary lymphadenopathy. Lungs/Pleura: Status post right upper lobectomy. Compensatory hyperexpansion of the right middle and lower lobes. There are again multiple small pulmonary nodules scattered throughout the lungs bilaterally which appear similar in size, number and pattern of distribution compared to the prior examination, with the largest of these nodules measuring only 6 mm in the right lower lobe (image 111 of series 3). The exception is the previously noted new 5 mm subpleural nodule in the medial aspect of the right lower lobe which has subsequently resolved. Previously discussed subpleural nodule in the periphery of the right middle lobe is slightly smaller than the prior examination (3 mm on image 70 of series 3), most compatible with a benign subpleural lymph node. No other new suspicious appearing pulmonary nodules or masses are noted. There is no acute consolidative airspace disease and no pleural effusions. Upper Abdomen: Unremarkable. Musculoskeletal: Bilateral subpectoral breast implants. There are no aggressive appearing lytic or blastic lesions noted in the visualized portions of the skeleton. IMPRESSION: 1. Status post right upper lobectomy.  Multiple previously noted tiny pulmonary nodules appear stable in size, number and distribution, and are favored to be benign. 2. Aortic atherosclerosis, in addition to left main and 2 vessel coronary artery disease. Assessment for potential risk factor modification, dietary therapy or pharmacologic therapy may be warranted, if clinically indicated. 3. There are calcifications of the mitral valve. Echocardiographic correlation for evaluation of potential valvular dysfunction may be warranted if clinically indicated. 4. Additional incidental findings, as above. Electronically Signed   By: Vinnie Langton M.D.   On: 11/25/2015 13:47    ASSESSMENT: Stage  Ia adenocarcinoma of the right upper lobe lung.  PLAN:     1. 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 Pathology and surgical report reviewed independently.  CT scan results from November 25, 2015 reviewed independently and reported as above with no evidence of progressive disease and stable pulmonary nodules. Will continue to watch these nodules closely. They are too small to biopsy and under the sensitivity for a PET scan.  Will restage patient every 6 months for 2 years after her surgery and then yearly after that for 5 years. Return to clinic in 6 months for repeat imaging and further evaluation.  2. Hypertension: Patient's blood pressure mildly elevated today. Monitor.  Patient expressed understanding and was in agreement with this plan. She also understands that She can call clinic at any time with any questions, concerns, or complaints.    Lloyd Huger, MD   12/05/2015 10:37 AM

## 2015-11-27 ENCOUNTER — Inpatient Hospital Stay: Payer: Medicare Other | Attending: Oncology | Admitting: Oncology

## 2015-11-27 VITALS — BP 141/79 | HR 77 | Temp 98.0°F | Resp 18 | Wt 154.0 lb

## 2015-11-27 DIAGNOSIS — R05 Cough: Secondary | ICD-10-CM | POA: Diagnosis not present

## 2015-11-27 DIAGNOSIS — Z87891 Personal history of nicotine dependence: Secondary | ICD-10-CM | POA: Diagnosis not present

## 2015-11-27 DIAGNOSIS — Z808 Family history of malignant neoplasm of other organs or systems: Secondary | ICD-10-CM | POA: Diagnosis not present

## 2015-11-27 DIAGNOSIS — Z8051 Family history of malignant neoplasm of kidney: Secondary | ICD-10-CM | POA: Diagnosis not present

## 2015-11-27 DIAGNOSIS — Z79899 Other long term (current) drug therapy: Secondary | ICD-10-CM | POA: Diagnosis not present

## 2015-11-27 DIAGNOSIS — Z902 Acquired absence of lung [part of]: Secondary | ICD-10-CM

## 2015-11-27 DIAGNOSIS — I517 Cardiomegaly: Secondary | ICD-10-CM

## 2015-11-27 DIAGNOSIS — C3411 Malignant neoplasm of upper lobe, right bronchus or lung: Secondary | ICD-10-CM | POA: Diagnosis not present

## 2015-11-27 DIAGNOSIS — I10 Essential (primary) hypertension: Secondary | ICD-10-CM

## 2015-11-27 DIAGNOSIS — K760 Fatty (change of) liver, not elsewhere classified: Secondary | ICD-10-CM | POA: Diagnosis not present

## 2015-11-27 DIAGNOSIS — I253 Aneurysm of heart: Secondary | ICD-10-CM | POA: Diagnosis not present

## 2015-11-27 DIAGNOSIS — F329 Major depressive disorder, single episode, unspecified: Secondary | ICD-10-CM

## 2015-11-27 DIAGNOSIS — K219 Gastro-esophageal reflux disease without esophagitis: Secondary | ICD-10-CM

## 2015-11-27 DIAGNOSIS — R0602 Shortness of breath: Secondary | ICD-10-CM

## 2015-11-27 DIAGNOSIS — I2510 Atherosclerotic heart disease of native coronary artery without angina pectoris: Secondary | ICD-10-CM | POA: Diagnosis not present

## 2015-11-27 NOTE — Progress Notes (Signed)
States is feeling well. Offers no complaints. 

## 2016-01-14 ENCOUNTER — Encounter: Payer: Self-pay | Admitting: Emergency Medicine

## 2016-01-14 ENCOUNTER — Ambulatory Visit
Admission: EM | Admit: 2016-01-14 | Discharge: 2016-01-14 | Disposition: A | Discharge status: Home / Self Care | Payer: Medicare Other | Attending: Emergency Medicine | Admitting: Emergency Medicine

## 2016-01-14 DIAGNOSIS — B9789 Other viral agents as the cause of diseases classified elsewhere: Secondary | ICD-10-CM | POA: Diagnosis not present

## 2016-01-14 DIAGNOSIS — J069 Acute upper respiratory infection, unspecified: Secondary | ICD-10-CM | POA: Diagnosis not present

## 2016-01-14 DIAGNOSIS — H6593 Unspecified nonsuppurative otitis media, bilateral: Secondary | ICD-10-CM

## 2016-01-14 MED ORDER — PREDNISONE 20 MG PO TABS: 40.0000 mg | ORAL_TABLET | Freq: Every day | ORAL | 0 refills | Status: DC

## 2016-01-14 MED ORDER — AZELASTINE HCL 0.15 % NA SOLN: 1.0000 | Freq: Two times a day (BID) | NASAL | 0 refills | Status: DC

## 2016-01-14 MED ORDER — AMOXICILLIN-POT CLAVULANATE 500-125 MG PO TABS: 1.0000 | ORAL_TABLET | Freq: Three times a day (TID) | ORAL | 0 refills | Status: AC

## 2016-01-14 MED ORDER — AMOXICILLIN-POT CLAVULANATE 500-125 MG PO TABS: 1.0000 | ORAL_TABLET | Freq: Three times a day (TID) | ORAL | 0 refills | Status: DC

## 2016-01-14 MED ORDER — PREDNISONE 20 MG PO TABS: 40.0000 mg | ORAL_TABLET | Freq: Every day | ORAL | 0 refills | Status: AC

## 2016-01-14 MED ORDER — SALINE SPRAY 0.65 % NA SOLN: 2.0000 | NASAL | 0 refills | Status: DC

## 2016-01-14 NOTE — ED Triage Notes (Signed)
Patient c/o sneezing, cough, and chest congestion for 4-5 days.

## 2016-01-14 NOTE — ED Provider Notes (Signed)
CSN: [id number]     Arrival date & time 01/14/16  1417 History   First MD Initiated Contact with Patient 01/14/16 1525     Chief Complaint  Patient presents with  . Cough   (Consider location/radiation/quality/duration/timing/severity/associated sxs/prior Treatment) Single caucasian female here for evaluation cough, sneezing, sweating, sore throat x 4 days.  Congestion nasal bothering her the most with post nasal drip.  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 Allergy   . Depression   . Fatty liver disease, nonalcoholic   . GERD (gastroesophageal reflux disease)   . Hypertension   . Lung cancer ([place]) 02/2014   RUL Lobectomy   Past Surgical History:  Procedure Laterality Date  . LAPAROSCOPIC HYSTERECTOMY  1977  . LARYNGOSCOPY N/A 07/22/2014   Procedure: JET LARYNGOSCOPY with right vocal cord biopsy;  Surgeon: Margaretha Sheffield, MD;  Location: [facility] ORS;  Service: ENT;  Laterality: N/A;  . LUNG CANCER SURGERY     Family History  Problem Relation Age of Onset  . Kidney cancer Cousin   . Cancer Maternal Grandfather   . Cancer Maternal Grandmother    Social History  Substance Use Topics  . Smoking status: Former Smoker    Packs/day: 0.50    Years: 25.00    Types: Cigarettes    Quit date: 07/18/2006  . Smokeless tobacco: Never Used  . Alcohol use 1.2 oz/week    1 Glasses of wine, 1 Cans of beer per week     Comment: occassionally about 4 times a year   OB History    No data available     Review of Systems  Constitutional: Positive for diaphoresis. Negative for activity change, appetite change, chills, fatigue, fever and unexpected weight change.  HENT: Positive for congestion,  postnasal drip, rhinorrhea, sinus pain, sinus pressure, sneezing and sore throat. Negative for dental problem, drooling, ear discharge, ear pain, facial swelling, hearing loss, mouth sores, nosebleeds, tinnitus, trouble swallowing and voice change.   Eyes: Negative for photophobia, pain, discharge, redness, itching and visual disturbance.  Respiratory: Positive for cough and wheezing. Negative for choking, chest tightness, shortness of breath and stridor.   Cardiovascular: Negative for chest pain, palpitations and leg swelling.  Gastrointestinal: Negative for abdominal distention, abdominal pain, blood in stool, constipation, diarrhea, nausea and vomiting.  Endocrine: Negative for cold intolerance and heat intolerance.  Genitourinary: Negative for difficulty urinating, dysuria and hematuria.  Musculoskeletal: Negative for arthralgias, back pain, gait problem, joint swelling, myalgias, neck pain and neck stiffness.  Skin: Negative for color change, pallor, rash and wound.  Allergic/Immunologic: Positive for environmental allergies. Negative for food allergies.  Neurological: Positive for headaches. Negative for dizziness, tremors, seizures, syncope, facial asymmetry, speech difficulty, weakness, light-headedness and numbness.  Hematological: Negative for adenopathy. Does not bruise/bleed easily.  Psychiatric/Behavioral: Negative for agitation, behavioral problems, confusion and sleep disturbance.    Allergies  Alendronate; Sulfa antibiotics; Ciprofloxacin; and Diphenhydramine  Home Medications   Prior to Admission medications   Medication Sig Start Date End Date Taking? Authorizing Provider  amoxicillin-clavulanate (AUGMENTIN) 500-125 MG tablet Take 1 tablet (500 mg total) by mouth 3 (three) times daily. 01/14/16 01/21/16  Olen Cordial, NP  Azelastine HCl 0.15 %  SOLN Place 1-2 sprays into the nose 2 (two) times daily. 01/14/16 02/13/16  Olen Cordial, NP  benazepril (LOTENSIN) 5 MG tablet  Take 1 tablet by mouth daily. 03/04/14   Historical Provider, MD  calcium citrate-vitamin D 500-400 MG-UNIT chewable tablet Chew 1 tablet by mouth daily.    Historical Provider, MD  cetirizine (ZYRTEC) 10 MG tablet Take by mouth.    Historical Provider, MD  Cholecalciferol (D 2000) 2000 UNITS TABS Take 1 tablet by mouth daily.    Historical Provider, MD  EPIPEN 2-PAK 0.3 MG/0.3ML SOAJ injection  05/17/15   Historical Provider, MD  estradiol (ESTRACE) 0.5 MG tablet  03/05/15   Historical Provider, MD  ibuprofen (ADVIL,MOTRIN) 600 MG tablet Take 1 tablet (600 mg total) by mouth every 8 (eight) hours as needed. 02/02/15   Paulette Blanch, MD  Multiple Vitamins-Minerals (OCUVITE ADULT 50+ PO) Take 1 tablet by mouth daily.    Historical Provider, MD  oxyCODONE-acetaminophen (ROXICET) 5-325 MG tablet Take 1 tablet by mouth every 4 (four) hours as needed for severe pain. 02/02/15   Paulette Blanch, MD  predniSONE (DELTASONE) 20 MG tablet Take 2 tablets (40 mg total) by mouth daily with breakfast. 01/14/16 01/19/16  Olen Cordial, NP  sodium chloride (OCEAN) 0.65 % SOLN nasal spray Place 2 sprays into both nostrils every 2 (two) hours while awake. 01/14/16 02/13/16  Olen Cordial, NP  Vitamins-Lipotropics (LIPO-FLAVONOID PLUS) TABS Take 1 tablet by mouth daily.    Historical Provider, MD   Meds Ordered and Administered this Visit  Medications - No data to display  BP 117/65 (BP Location: Left Arm)   Pulse 72   Temp 97.9 F (36.6 C) (Oral)   Resp 16   Ht '5\' 6"'$  (1.676 m)   Wt 152 lb (68.9 kg)   SpO2 100%   BMI 24.53 kg/m  No data found.   Physical Exam  Constitutional: She is oriented to person, place, and time. Vital signs are normal. She appears well-developed and well-nourished. She is active and cooperative.  Non-toxic appearance. She does not have a sickly appearance. She does not appear ill. No distress.  HENT:  Head: Normocephalic and atraumatic.  Right Ear: Hearing, external ear and ear canal  normal. A middle ear effusion is present.  Left Ear: Hearing, external ear and ear canal normal. A middle ear effusion is present.  Nose: Mucosal edema and rhinorrhea present. No nose lacerations, sinus tenderness, nasal deformity, septal deviation or nasal septal hematoma. No epistaxis.  No foreign bodies. Right sinus exhibits no maxillary sinus tenderness and no frontal sinus tenderness. Left sinus exhibits no maxillary sinus tenderness and no frontal sinus tenderness.  Mouth/Throat: Uvula is midline and mucous membranes are normal. Mucous membranes are not pale, not dry and not cyanotic. She has dentures. No oral lesions. No trismus in the jaw. Normal dentition. No dental abscesses, uvula swelling, lacerations or dental caries. Posterior oropharyngeal edema and posterior oropharyngeal erythema present. No oropharyngeal exudate or tonsillar abscesses.  Cobblestoning posterior pharynx; bilateral TMs with air fluid level clear; bilateral allergic shiners; bilateral nasal turbinates edema/erythema clear discharge  Eyes: Conjunctivae, EOM and lids are normal. Pupils are equal, round, and reactive to light. Right eye exhibits no chemosis, no discharge, no exudate and no hordeolum. No foreign body present in the right eye. Left eye exhibits no chemosis, no discharge, no exudate and no hordeolum. No foreign body present in the left eye. Right conjunctiva is not injected. Right conjunctiva has  no hemorrhage. Left conjunctiva is not injected. Left conjunctiva has no hemorrhage. No scleral icterus. Right eye exhibits normal extraocular motion and no nystagmus. Left eye exhibits normal extraocular motion and no nystagmus. Right pupil is round and reactive. Left pupil is round and reactive. Pupils are equal.  Neck: Trachea normal, normal range of motion and phonation normal. Neck supple. No tracheal tenderness, no spinous process tenderness and no muscular tenderness present. No neck rigidity. No tracheal deviation, no  edema, no erythema and normal range of motion present. No thyroid mass and no thyromegaly present.  Cardiovascular: Normal rate, regular rhythm, S1 normal, S2 normal, normal heart sounds and intact distal pulses.  PMI is not displaced.  Exam reveals no gallop and no friction rub.   No murmur heard. Pulmonary/Chest: Effort normal and breath sounds normal. No accessory muscle usage or stridor. No respiratory distress. She has no decreased breath sounds. She has no wheezes. She has no rhonchi. She has no rales. She exhibits no tenderness.  Abdominal: Soft. She exhibits no distension.  Musculoskeletal: Normal range of motion. She exhibits no edema or tenderness.       Right shoulder: Normal.       Left shoulder: Normal.       Right hip: Normal.       Left hip: Normal.       Right knee: Normal.       Left knee: Normal.       Cervical back: Normal.       Right hand: Normal.       Left hand: Normal.  Lymphadenopathy:       Head (right side): No submental, no submandibular, no tonsillar, no preauricular, no posterior auricular and no occipital adenopathy present.       Head (left side): No submental, no submandibular, no tonsillar, no preauricular, no posterior auricular and no occipital adenopathy present.    She has no cervical adenopathy.       Right cervical: No superficial cervical, no deep cervical and no posterior cervical adenopathy present.      Left cervical: No superficial cervical, no deep cervical and no posterior cervical adenopathy present.  Neurological: She is alert and oriented to person, place, and time. She has normal strength. She is not disoriented. She displays no atrophy and no tremor. No cranial nerve deficit or sensory deficit. She exhibits normal muscle tone. She displays no seizure activity. Coordination and gait normal. GCS eye subscore is 4. GCS verbal subscore is 5. GCS motor subscore is 6.  Skin: Skin is warm, dry and intact. Capillary refill takes less than 2 seconds.  No abrasion, no bruising, no burn, no ecchymosis, no laceration, no lesion, no petechiae and no rash noted. She is not diaphoretic. No cyanosis or erythema. No pallor. Nails show no clubbing.  Psychiatric: She has a normal mood and affect. Her speech is normal and behavior is normal. Judgment and thought content normal. She is not actively hallucinating. Cognition and memory are normal. She is attentive.  Nursing note and vitals reviewed.   Urgent Care Course   Clinical Course     Procedures (including critical care time)  Labs Review Labs Reviewed - No data to display  Imaging Review No results found.       MDM   1. Viral URI with cough   2. Otitis media with effusion, bilateral    Patient may use normal saline nasal spray as needed.  Consider antihistamine azelastine 1-2 sprays each nostril BID.  Avoiding nasal steroid use due to vocal cord and lung cancer.  Avoid triggers if possible.  Shower prior to bedtime if exposed to triggers.  If allergic dust/dust mites recommend mattress/pillow covers/encasements; washing linens, vacuuming, sweeping, dusting weekly.  Call or return to clinic as needed if these symptoms worsen or fail to improve as anticipated.   Exitcare handout on allergic rhinitis given to patient.  Patient verbalized understanding of instructions, agreed with plan of care and had no further questions at this time.  P2:  Avoidance and hand washing.  Supportive treatment.   No evidence of invasive bacterial infection, non toxic and well hydrated.  This is most likely self limiting viral infection.  I do not see where any further testing or imaging is necessary at this time.   I will suggest supportive care, rest, good hygiene and encourage the patient to take adequate fluids.  The patient is to return to clinic or EMERGENCY ROOM if symptoms worsen or change significantly e.g. ear pain, fever, purulent discharge from ears or bleeding.  Exitcare handout on otitis media with  effusion given to patient.  Patient verbalized agreement and understanding of treatment plan.      Suspect Viral illness: no evidence of invasive bacterial infection, non toxic and well hydrated.  This is most likely self limiting viral infection.  I do not see where any further testing or imaging is necessary at this time.   I will suggest supportive care, rest, good hygiene and encourage the patient to take adequate fluids.  48 hours work excuse given.  Hydrate Hydrate Hydrate due to chronic kidney disease avoid phenylephrine, sudafed and caution with oral antihistamines. nasal saline 1-2 sprays each nostril prn q2h, motrin '800mg'$  po TID prn.  Discussed honey with lemon and salt water gargles for comfort also.  The patient is to return to clinic or EMERGENCY ROOM if symptoms worsen or change significantly e.g. fever, lethargy, SOB, wheezing.  Exitcare handout on viral illness given to patient.  Patient verbalized agreement and understanding of treatment plan.    Azelastine 1-2 sprays each nostril BID, saline 2 sprays each nostril q2h prn congestion.  If no improvement with 48 hours of saline and flonase use start augmentin '500mg'$  po TID x 7 days.  Rx given.  No evidence of systemic bacterial infection, non toxic and well hydrated.  I do not see where any further testing or imaging is necessary at this time.   I will suggest supportive care, rest, good hygiene and encourage the patient to take adequate fluids.  The patient is to return to clinic or EMERGENCY ROOM if symptoms worsen or change significantly.  Exitcare handout on sinusitis given to patient.  Patient verbalized agreement and understanding of treatment plan and had no further questions at this time.   P2:  Hand washing and cover cough  Rx given for augmentin '500mg'$  po TID x 7 days and prednisone '40mg'$  po daily x 5 days with breakfast starting tomorrow.  Bronchitis simple, community acquired, may have started as viral (probably respiratory syncytial,  parainfluenza, influenza, or adenovirus), but now evidence of acute purulent bronchitis with resultant bronchial edema and mucus formation.  Viruses are the most common cause of bronchial inflammation in otherwise healthy adults with acute bronchitis.  The appearance of sputum is not predictive of whether a bacterial infection is present.  Purulent sputum is most often caused by viral infections.  There are a small portion of those caused by non-viral agents being Mycoplamsa pneumonia.  Microscopic examination or C&S of sputum in the healthy adult with acute bronchitis is generally not helpful (usually negative or normal respiratory flora) other considerations being cough from upper respiratory tract infections, sinusitis or allergic syndromes (mild asthma or viral pneumonia).  Differential Diagnosis:  reactive airway disease (asthma, allergic aspergillosis (eosinophilia), chronic bronchitis, respiratory infection (Sinusitis, Common cold, pneumonia), congestive heart failure, reflux esophagitis, bronchogenic tumor, aspiration syndromes and/or exposure irritants/tobacco smoke.  In this case, there is no evidence of any invasive bacterial illness.  Most likely viral etiology so will hold on antibiotic treatment.  Advise supportive care with rest, encourage fluids, good hygiene and watch for any worsening symptoms.  If they were to develop:  come back to the office or go to the emergency room if after hours. Without high fever, severe dyspnea, lack of physical findings or other risk factors, I will hold on a chest radiograph and CBC at this time. I discussed that approximately 50% of patients with acute bronchitis have a cough that lasts up to three weeks, and 25% for over a month.  Tylenol, one to two tablets every four hours as needed for fever or myalgias.   No aspirin.  Patient instructed to follow up in one week or sooner if symptoms worsen. Patient verbalized agreement and understanding of treatment plan.  P2:   hand washing and cover cough  School/work excuse note given to patient for 48 hours. Rx prednsione, augmentin, has tylenol at home and cough drops.  Suspect post nasal drip inflammation Rx azelastine and nasal saline for first line use.  Usually no specific medical treatment is needed if a virus is causing the sore throat.  The throat most often gets better on its own within 5 to 7 days.  Antibiotic medicine does not cure viral pharyngitis.   For acute pharyngitis caused by bacteria, your healthcare provider will prescribe an antibiotic.  Marland Kitchen Do not smoke.  Marland Kitchen Avoid secondhand smoke and other air pollutants.  . Use a cool mist humidifier to add moisture to the air.  . Get plenty of rest.  . You may want to rest your throat by talking less and eating a diet that is mostly liquid or soft for a day or two.   Marland Kitchen Nonprescription throat lozenges and mouthwashes should help relieve the soreness.   . Gargling with warm saltwater and drinking warm liquids may help.  (You can make a saltwater solution by adding 1/4 teaspoon of salt to 8 ounces, or 240 mL, of warm water.)  . A nonprescription pain reliever such as acetaminophen may ease general aches and pains.   FOLLOW UP with clinic provider if no improvements in the next 7-10 days.  Patient verbalized understanding of instructions and agreed with plan of care. P2:  Hand washing and diet.        Olen Cordial, NP 01/14/16 [phone]

## 2016-01-17 ENCOUNTER — Telehealth: Payer: Self-pay

## 2016-01-17 NOTE — Telephone Encounter (Signed)
Courtesy call back completed today for patient's recent visit at [facility]. Patient did not answer, left message on machine to call back with any questions or concerns.   

## 2016-03-07 ENCOUNTER — Ambulatory Visit
Admission: EM | Admit: 2016-03-07 | Discharge: 2016-03-07 | Disposition: A | Discharge status: Home / Self Care | Payer: Medicare Other | Attending: Family Medicine | Admitting: Family Medicine

## 2016-03-07 DIAGNOSIS — J069 Acute upper respiratory infection, unspecified: Secondary | ICD-10-CM

## 2016-03-07 DIAGNOSIS — B9789 Other viral agents as the cause of diseases classified elsewhere: Secondary | ICD-10-CM

## 2016-03-07 NOTE — ED Provider Notes (Signed)
[facility]    CSN: [id number] Arrival date & time: 03/07/16  1306     History   Chief Complaint Chief Complaint  Patient presents with  . Sore Throat    HPI Christy [name] is a 76 y.o. female.   The history is provided by the patient.  Sore Throat  Pertinent negatives include no headaches.  URI  Presenting symptoms: cough, fatigue, rhinorrhea and sore throat   Severity:  Mild Onset quality:  Sudden Duration:  1 day Timing:  Constant Progression:  Unchanged Chronicity:  New Relieved by:  Nothing Ineffective treatments:  None tried Associated symptoms: no headaches, no myalgias, no sinus pain and no wheezing   Risk factors: being elderly   Risk factors: no chronic cardiac disease, no chronic kidney disease, no chronic respiratory disease, no diabetes mellitus, no immunosuppression, no recent illness, no recent travel and no sick contacts     Past Medical History:  Diagnosis Date  . Allergy   . Depression   . Fatty liver disease, nonalcoholic   . GERD (gastroesophageal reflux disease)   . Hypertension   . Lung cancer ([place]) 02/2014   RUL Lobectomy    Patient Active Problem List   Diagnosis Date Noted  . SUI (stress urinary incontinence, female) 10/15/2014  . Polypharmacy 09/02/2014  . Primary cancer of right upper lobe of lung ([place]) 07/19/2014  . Acid reflux 07/18/2014  . 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 Chronic kidney disease (CKD), stage III (moderate) 08/30/2013  . Benign essential HTN 08/30/2013  . Fatty liver disease, nonalcoholic 44/04/4740  . Fibrositis 08/30/2013  . Hot flash, menopausal 08/30/2013  . Billowing mitral valve 08/30/2013  . Allergic rhinitis, seasonal 08/30/2013  . Fibromyalgia 08/30/2013    Past Surgical History:  Procedure Laterality Date  . LAPAROSCOPIC HYSTERECTOMY  1977  . LARYNGOSCOPY N/A 07/22/2014   Procedure: JET LARYNGOSCOPY with right vocal cord biopsy;   Surgeon: Margaretha Sheffield, MD;  Location: [facility] ORS;  Service: ENT;  Laterality: N/A;  . LUNG CANCER SURGERY      OB History    No data available       Home Medications    Prior to Admission medications   Medication Sig Start Date End Date Taking? Authorizing Provider  Azelastine HCl 0.15 % SOLN Place 1-2 sprays into the nose 2 (two) times daily. 01/14/16 02/13/16  Olen Cordial, NP  benazepril (LOTENSIN) 5 MG tablet Take 1 tablet by mouth daily. 03/04/14   Historical Provider, MD  calcium citrate-vitamin D 500-400 MG-UNIT chewable tablet Chew 1 tablet by mouth daily.    Historical Provider, MD  cetirizine (ZYRTEC) 10 MG tablet Take by mouth.    Historical Provider, MD  Cholecalciferol (D 2000) 2000 UNITS TABS Take 1 tablet by mouth daily.    Historical Provider, MD  EPIPEN 2-PAK 0.3 MG/0.3ML SOAJ injection  05/17/15   Historical Provider, MD  estradiol (ESTRACE) 0.5 MG tablet  03/05/15   Historical Provider, MD  ibuprofen (ADVIL,MOTRIN) 600 MG tablet Take 1 tablet (600 mg total) by mouth every 8 (eight) hours as needed. 02/02/15   Paulette Blanch, MD  Multiple Vitamins-Minerals (OCUVITE ADULT 50+ PO) Take 1 tablet by mouth daily.    Historical Provider, MD  oxyCODONE-acetaminophen (ROXICET) 5-325 MG tablet Take 1 tablet by mouth every 4 (four) hours as needed for severe pain. 02/02/15   Paulette Blanch, MD  sodium chloride (OCEAN) 0.65 % SOLN nasal spray Place 2 sprays into  both nostrils every 2 (two) hours while awake. 01/14/16 02/13/16  Olen Cordial, NP  Vitamins-Lipotropics (LIPO-FLAVONOID PLUS) TABS Take 1 tablet by mouth daily.    Historical Provider, MD    Family History Family History  Problem Relation Age of Onset  . Kidney cancer Cousin   . Cancer Maternal Grandfather   . Cancer Maternal Grandmother     Social History Social History  Substance Use Topics  . Smoking status: Former Smoker    Packs/day: 0.50    Years: 25.00    Types: Cigarettes    Quit date: 07/18/2006  . Smokeless  tobacco: Never Used  . Alcohol use 1.2 oz/week    1 Glasses of wine, 1 Cans of beer per week     Comment: occassionally about 4 times a year     Allergies   Alendronate; Sulfa antibiotics; Ciprofloxacin; and Diphenhydramine   Review of Systems Review of Systems  Constitutional: Positive for fatigue.  HENT: Positive for rhinorrhea and sore throat. Negative for sinus pain.   Respiratory: Positive for cough. Negative for wheezing.   Musculoskeletal: Negative for myalgias.  Neurological: Negative for headaches.     Physical Exam Triage Vital Signs ED Triage Vitals  Enc Vitals Group     BP 03/07/16 1334 (!) 142/63     Pulse Rate 03/07/16 1334 68     Resp 03/07/16 1334 16     Temp 03/07/16 1334 97.8 F (36.6 C)     Temp Source 03/07/16 1334 Oral     SpO2 03/07/16 1334 100 %     Weight 03/07/16 1336 150 lb (68 kg)     Height 03/07/16 1336 '5\' 6"'$  (1.676 m)     Head Circumference --      Peak Flow --      Pain Score 03/07/16 1338 3     Pain Loc --      Pain Edu? --      Excl. in [place]? --    No data found.   Updated Vital Signs BP (!) 142/63 (BP Location: Right Arm)   Pulse 68   Temp 97.8 F (36.6 C) (Oral)   Resp 16   Ht '5\' 6"'$  (1.676 m)   Wt 150 lb (68 kg)   SpO2 100%   BMI 24.21 kg/m   Visual Acuity Right Eye Distance:   Left Eye Distance:   Bilateral Distance:    Right Eye Near:   Left Eye Near:    Bilateral Near:     Physical Exam  Constitutional: She appears well-developed and well-nourished. No distress.  HENT:  Head: Normocephalic and atraumatic.  Right Ear: Tympanic membrane, external ear and ear canal normal.  Left Ear: Tympanic membrane, external ear and ear canal normal.  Nose: Rhinorrhea present. No mucosal edema, nose lacerations, sinus tenderness, nasal deformity, septal deviation or nasal septal hematoma. No epistaxis.  No foreign bodies. Right sinus exhibits no maxillary sinus tenderness and no frontal sinus tenderness. Left sinus exhibits no  maxillary sinus tenderness and no frontal sinus tenderness.  Mouth/Throat: Uvula is midline, oropharynx is clear and moist and mucous membranes are normal. No oropharyngeal exudate.  Eyes: Conjunctivae and EOM are normal. Pupils are equal, round, and reactive to light. Right eye exhibits no discharge. Left eye exhibits no discharge. No scleral icterus.  Neck: Normal range of motion. Neck supple. No thyromegaly present.  Cardiovascular: Normal rate, regular rhythm and normal heart sounds.   Pulmonary/Chest: Effort normal and breath sounds normal. No respiratory distress.  She has no wheezes. She has no rales.  Lymphadenopathy:    She has no cervical adenopathy.  Skin: She is not diaphoretic.  Nursing note and vitals reviewed.    UC Treatments / Results  Labs (all labs ordered are listed, but only abnormal results are displayed) Labs Reviewed - No data to display  EKG  EKG Interpretation None       Radiology No results found.  Procedures Procedures (including critical care time)  Medications Ordered in UC Medications - No data to display   Initial Impression / Assessment and Plan / UC Course  I have reviewed the triage vital signs and the nursing notes.  Pertinent labs & imaging results that were available during my care of the patient were reviewed by me and considered in my medical decision making (see chart for details).       Final Clinical Impressions(s) / UC Diagnoses   Final diagnoses:  Viral URI with cough    New Prescriptions Discharge Medication List as of 03/07/2016  3:02 PM     1. diagnosis reviewed with patient 2.  Recommend supportive treatment with rest, fluids 3. Follow-up prn if symptoms worsen or don't improve   Norval Gable, MD 03/07/16 1507

## 2016-03-07 NOTE — ED Triage Notes (Addendum)
Pt had recent subjective fever and woke today with a sore throat. Mild pain 3/10. 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

## 2016-04-04 ENCOUNTER — Ambulatory Visit
Admission: EM | Admit: 2016-04-04 | Discharge: 2016-04-04 | Disposition: A | Discharge status: Home / Self Care | Payer: Medicare Other | Attending: Family Medicine | Admitting: Family Medicine

## 2016-04-04 ENCOUNTER — Encounter: Payer: Self-pay | Admitting: Gynecology

## 2016-04-04 DIAGNOSIS — J069 Acute upper respiratory infection, unspecified: Secondary | ICD-10-CM | POA: Diagnosis not present

## 2016-04-04 MED ORDER — HYDROCOD POLST-CPM POLST ER 10-8 MG/5ML PO SUER: 5.0000 mL | Freq: Two times a day (BID) | ORAL | 0 refills | Status: DC | PRN

## 2016-04-04 NOTE — Discharge Instructions (Signed)
This appears viral.  Does not appear that you have the flu.  Use the cough medication as needed.  Follow up if you worsen or fail to improve.  Take care  Dr. Lacinda Axon

## 2016-04-04 NOTE — ED Triage Notes (Signed)
Per patient not feeling since yesterday. Per patient cough, feeling tired and chills

## 2016-04-04 NOTE — ED Provider Notes (Signed)
[facility]    CSN: [id number] Arrival date & time: 04/04/16  0802  History   Chief Complaint Chief Complaint  Patient presents with  . Flu symptoms   HPI  76 year old female with a history of lung cancer presents with the above complaint.  Patient states that yesterday evening she began not feeling well. She's had sore throat. She has felt fevers. She has not taken her temperature. She is also had a mild cough. She states that she just does not feel well. No reports of significant body aches. She does report fatigue. She is not febrile today. No other associated symptoms. No known exacerbating or relieving factors. No other complaints or concerns at this time.  Past Medical History:  Diagnosis Date  . Allergy   . Depression   . Fatty liver disease, nonalcoholic   . GERD (gastroesophageal reflux disease)   . Hypertension   . Lung cancer ([place]) 02/2014   RUL Lobectomy    Patient Active Problem List   Diagnosis Date Noted  . SUI (stress urinary incontinence, female) 10/15/2014  . Polypharmacy 09/02/2014  . Primary cancer of right upper lobe of lung ([place]) 07/19/2014  . Acid reflux 07/18/2014  . 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 Chronic kidney disease (CKD), stage III (moderate) 08/30/2013  . Benign essential HTN 08/30/2013  . Fatty liver disease, nonalcoholic 77/82/4235  . Fibrositis 08/30/2013  . Hot flash, menopausal 08/30/2013  . Billowing mitral valve 08/30/2013  . Allergic rhinitis, seasonal 08/30/2013  . Fibromyalgia 08/30/2013    Past Surgical History:  Procedure Laterality Date  . LAPAROSCOPIC HYSTERECTOMY  1977  . LARYNGOSCOPY N/A 07/22/2014   Procedure: JET LARYNGOSCOPY with right vocal cord biopsy;  Surgeon: Margaretha Sheffield, MD;  Location: [facility] ORS;  Service: ENT;  Laterality: N/A;  . LUNG CANCER SURGERY      OB History    No data available       Home Medications    Prior to Admission medications     Medication Sig Start Date End Date Taking? Authorizing Provider  benazepril (LOTENSIN) 5 MG tablet Take 1 tablet by mouth daily. 03/04/14  Yes Historical Provider, MD  calcium citrate-vitamin D 500-400 MG-UNIT chewable tablet Chew 1 tablet by mouth daily.   Yes Historical Provider, MD  cetirizine (ZYRTEC) 10 MG tablet Take by mouth.   Yes Historical Provider, MD  Cholecalciferol (D 2000) 2000 UNITS TABS Take 1 tablet by mouth daily.   Yes Historical Provider, MD  EPIPEN 2-PAK 0.3 MG/0.3ML SOAJ injection  05/17/15  Yes Historical Provider, MD  estradiol (ESTRACE) 0.5 MG tablet  03/05/15  Yes Historical Provider, MD  ibuprofen (ADVIL,MOTRIN) 600 MG tablet Take 1 tablet (600 mg total) by mouth every 8 (eight) hours as needed. 02/02/15  Yes Paulette Blanch, MD  Multiple Vitamins-Minerals (OCUVITE ADULT 50+ PO) Take 1 tablet by mouth daily.   Yes Historical Provider, MD  oxyCODONE-acetaminophen (ROXICET) 5-325 MG tablet Take 1 tablet by mouth every 4 (four) hours as needed for severe pain. 02/02/15  Yes Paulette Blanch, MD  Vitamins-Lipotropics (LIPO-FLAVONOID PLUS) TABS Take 1 tablet by mouth daily.   Yes Historical Provider, MD  Azelastine HCl 0.15 % SOLN Place 1-2 sprays into the nose 2 (two) times daily. 01/14/16 02/13/16  Olen Cordial, NP  chlorpheniramine-HYDROcodone (TUSSIONEX PENNKINETIC ER) 10-8 MG/5ML SUER Take 5 mLs by mouth every 12 (twelve) hours as needed. 04/04/16   Coral Spikes, DO  sodium chloride (OCEAN) 0.65 %  SOLN nasal spray Place 2 sprays into both nostrils every 2 (two) hours while awake. 01/14/16 02/13/16  Olen Cordial, NP    Family History Family History  Problem Relation Age of Onset  . Kidney cancer Cousin   . Cancer Maternal Grandfather   . Cancer Maternal Grandmother     Social History Social History  Substance Use Topics  . Smoking status: Former Smoker    Packs/day: 0.50    Years: 25.00    Types: Cigarettes    Quit date: 07/18/2006  . Smokeless tobacco: Never Used  .  Alcohol use 1.2 oz/week    1 Glasses of wine, 1 Cans of beer per week     Comment: occassionally about 4 times a year     Allergies   Alendronate; Sulfa antibiotics; Ciprofloxacin; and Diphenhydramine   Review of Systems Review of Systems  Constitutional: Positive for fatigue.  HENT: Positive for sore throat.   Respiratory: Positive for cough.    Physical Exam Triage Vital Signs ED Triage Vitals  Enc Vitals Group     BP 04/04/16 0825 137/67     Pulse Rate 04/04/16 0825 83     Resp 04/04/16 0825 16     Temp 04/04/16 0825 98.2 F (36.8 C)     Temp Source 04/04/16 0825 Oral     SpO2 04/04/16 0825 98 %     Weight 04/04/16 0827 150 lb (68 kg)     Height --      Head Circumference --      Peak Flow --      Pain Score 04/04/16 0828 3     Pain Loc --      Pain Edu? --      Excl. in [place]? --    Updated Vital Signs BP 137/67 (BP Location: Left Arm)   Pulse 83   Temp 98.2 F (36.8 C) (Oral)   Resp 16   Wt 150 lb (68 kg)   SpO2 98%   BMI 24.21 kg/m  Physical Exam  Constitutional: She is oriented to person, place, and time. She appears well-developed. No distress.  HENT:  Head: Normocephalic and atraumatic.  Mouth/Throat: Oropharynx is clear and moist.  Eyes: Conjunctivae are normal.  Neck: Neck supple.  Cardiovascular: Normal rate and regular rhythm.   Pulmonary/Chest: Effort normal and breath sounds normal.  Lymphadenopathy:    She has no cervical adenopathy.  Neurological: She is alert and oriented to person, place, and time.  Psychiatric: She has a normal mood and affect.  Vitals reviewed.  UC Treatments / Results  Labs (all labs ordered are listed, but only abnormal results are displayed) Labs Reviewed - No data to display  EKG  EKG Interpretation None       Radiology No results found.  Procedures Procedures (including critical care time)  Medications Ordered in UC Medications - No data to display  Initial Impression / Assessment and Plan / UC  Course  I have reviewed the triage vital signs and the nursing notes.  Pertinent labs & imaging results that were available during my care of the patient were reviewed by me and considered in my medical decision making (see chart for details).    76 year old female presents with signs and symptoms of an upper respiratory infection. Treating with supportive care and Tussionex for cough.  Final Clinical Impressions(s) / UC Diagnoses   Final diagnoses:  Upper respiratory tract infection, unspecified type    New Prescriptions Discharge Medication List as of  04/04/2016  8:40 AM    START taking these medications   Details  chlorpheniramine-HYDROcodone (TUSSIONEX PENNKINETIC ER) 10-8 MG/5ML SUER Take 5 mLs by mouth every 12 (twelve) hours as needed., Starting Sun 04/04/2016, Normal         Coral Spikes, DO 04/04/16 [id number]

## 2016-04-06 ENCOUNTER — Encounter: Payer: Self-pay | Admitting: Emergency Medicine

## 2016-04-06 ENCOUNTER — Ambulatory Visit
Admission: EM | Admit: 2016-04-06 | Discharge: 2016-04-06 | Disposition: A | Discharge status: Home / Self Care | Payer: Medicare Other | Attending: Emergency Medicine | Admitting: Emergency Medicine

## 2016-04-06 ENCOUNTER — Ambulatory Visit (INDEPENDENT_AMBULATORY_CARE_PROVIDER_SITE_OTHER): Payer: Medicare Other

## 2016-04-06 DIAGNOSIS — J181 Lobar pneumonia, unspecified organism: Secondary | ICD-10-CM | POA: Diagnosis not present

## 2016-04-06 DIAGNOSIS — J189 Pneumonia, unspecified organism: Secondary | ICD-10-CM

## 2016-04-06 MED ORDER — AZITHROMYCIN 250 MG PO TABS: 250.0000 mg | ORAL_TABLET | Freq: Every day | ORAL | 0 refills | Status: DC

## 2016-04-06 MED ORDER — ALBUTEROL SULFATE HFA 108 (90 BASE) MCG/ACT IN AERS: 1.0000 | INHALATION_SPRAY | Freq: Four times a day (QID) | RESPIRATORY_TRACT | 0 refills | Status: DC | PRN

## 2016-04-06 NOTE — ED Triage Notes (Signed)
Patient c/o cough, bodyaches and fever that started on Saturday.  Patient states that she saw Dr. Lacinda Axon on Sunday.

## 2016-04-06 NOTE — ED Provider Notes (Signed)
CSN: [id number]     Arrival date & time 04/06/16  1724 History   First MD Initiated Contact with Patient 04/06/16 1908     Chief Complaint  Patient presents with  . Cough   (Consider location/radiation/quality/duration/timing/severity/associated sxs/prior Treatment) HPI  This a 76 year old female who presents with a cough body aches and fever started Saturday. She was seen by Dr. Lacinda Axon on Sunday and diagnosed her with an upper respiratory infection. 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 She has fever and body aches and is coughing up green sputum. Her O2 sats are 98% on room air. Temperature is 99.7 pulse rate of 98      Past Medical History:  Diagnosis Date  . Allergy   . Depression   . Fatty liver disease, nonalcoholic   . GERD (gastroesophageal reflux disease)   . Hypertension   . Lung cancer ([place]) 02/2014   RUL Lobectomy   Past Surgical History:  Procedure Laterality Date  . ABDOMINAL HYSTERECTOMY    . LAPAROSCOPIC HYSTERECTOMY  1977  . LARYNGOSCOPY N/A 07/22/2014   Procedure: JET LARYNGOSCOPY with right vocal cord biopsy;  Surgeon: Margaretha Sheffield, MD;  Location: [facility] ORS;  Service: ENT;  Laterality: N/A;  . LUNG CANCER SURGERY     Family History  Problem Relation Age of Onset  . Kidney cancer Cousin   . Cancer Maternal Grandfather   . Cancer Maternal Grandmother    Social History  Substance Use Topics  . Smoking status: Former Smoker    Packs/day: 0.50    Years: 25.00    Types: Cigarettes    Quit date: 07/18/2006  . Smokeless tobacco: Never Used  . Alcohol use 1.2 oz/week    1 Glasses of wine, 1 Cans of beer per week     Comment: occassionally about 4 times a year   OB History    No data available     Review of Systems  Constitutional: Positive for activity change, chills, fatigue and fever.  HENT: Positive for congestion.   Respiratory: Positive for cough and shortness of breath. Negative for wheezing and  stridor.   All other systems reviewed and are negative.   Allergies  Alendronate; Sulfa antibiotics; Ciprofloxacin; and Diphenhydramine  Home Medications   Prior to Admission medications   Medication Sig Start Date End Date Taking? Authorizing Provider  albuterol (PROVENTIL HFA;VENTOLIN HFA) 108 (90 Base) MCG/ACT inhaler Inhale 1-2 puffs into the lungs every 6 (six) hours as needed for wheezing or shortness of breath. Used with a spacer 04/06/16   Lorin Picket, PA-C  Azelastine HCl 0.15 % SOLN Place 1-2 sprays into the nose 2 (two) times daily. 01/14/16 02/13/16  Olen Cordial, NP  azithromycin (ZITHROMAX) 250 MG tablet Take 1 tablet (250 mg total) by mouth daily. Take first 2 tablets together, then 1 every day until finished. 04/06/16   Lorin Picket, PA-C  benazepril (LOTENSIN) 5 MG tablet Take 1 tablet by mouth daily. 03/04/14   Historical Provider, MD  calcium citrate-vitamin D 500-400 MG-UNIT chewable tablet Chew 1 tablet by mouth daily.    Historical Provider, MD  cetirizine (ZYRTEC) 10 MG tablet Take by mouth.    Historical Provider, MD  chlorpheniramine-HYDROcodone (TUSSIONEX PENNKINETIC ER) 10-8 MG/5ML SUER Take 5 mLs by mouth every 12 (twelve) hours as needed. 04/04/16   Coral Spikes, DO  Cholecalciferol (D 2000) 2000 UNITS TABS Take 1 tablet by mouth  daily.    Historical Provider, MD  EPIPEN 2-PAK 0.3 MG/0.3ML SOAJ injection  05/17/15   Historical Provider, MD  estradiol (ESTRACE) 0.5 MG tablet  03/05/15   Historical Provider, MD  ibuprofen (ADVIL,MOTRIN) 600 MG tablet Take 1 tablet (600 mg total) by mouth every 8 (eight) hours as needed. 02/02/15   Paulette Blanch, MD  Multiple Vitamins-Minerals (OCUVITE ADULT 50+ PO) Take 1 tablet by mouth daily.    Historical Provider, MD  oxyCODONE-acetaminophen (ROXICET) 5-325 MG tablet Take 1 tablet by mouth every 4 (four) hours as needed for severe pain. 02/02/15   Paulette Blanch, MD  sodium chloride (OCEAN) 0.65 % SOLN nasal spray Place 2 sprays  into both nostrils every 2 (two) hours while awake. 01/14/16 02/13/16  Olen Cordial, NP  Vitamins-Lipotropics (LIPO-FLAVONOID PLUS) TABS Take 1 tablet by mouth daily.    Historical Provider, MD   Meds Ordered and Administered this Visit  Medications - No data to display  BP 138/85 (BP Location: Left Arm)   Pulse 98   Temp 99.7 F (37.6 C) (Oral)   Resp 16   Ht '5\' 6"'$  (1.676 m)   Wt 159 lb (72.1 kg)   SpO2 98%   BMI 25.66 kg/m  No data found.   Physical Exam  Constitutional: She appears well-developed and well-nourished. No distress.  HENT:  Head: Normocephalic and atraumatic.  Right Ear: External ear normal.  Left Ear: External ear normal.  Nose: Nose normal.  Mouth/Throat: Oropharynx is clear and moist. No oropharyngeal exudate.  Eyes: Pupils are equal, round, and reactive to light.  Neck: Normal range of motion.  Pulmonary/Chest: Effort normal. She has rales.  Has fine crackles non-tussive in the left base  Skin: She is not diaphoretic.  Nursing note and vitals reviewed.   Urgent Care Course     Procedures (including critical care time)  Labs Review Labs Reviewed - No data to display  Imaging Review Dg Chest 2 View  Result Date: 04/06/2016 CLINICAL DATA:  Cough and fever for 3 days productive cough. EXAM: CHEST  2 VIEW COMPARISON:  Chest CT 11/25/2015 FINDINGS: Post right upper lobectomy with chain sutures at the hilum. Focal patchy opacity at the medial left lung base concerning for pneumonia. Minimal right basilar and biapical scarring. Normal heart size and mediastinal contours. No pulmonary edema. No pleural fluid. Chronic right rib deformities of the lateral fifth and sixth ribs. IMPRESSION: Left lung base opacity suspicious for pneumonia. Recommend radiographic follow-up in 3-4 weeks after course of treatment to ensure resolution. Electronically Signed   By: Jeb Levering M.D.   On: 04/06/2016 19:42     Visual Acuity Review  Right Eye Distance:   Left  Eye Distance:   Bilateral Distance:    Right Eye Near:   Left Eye Near:    Bilateral Near:         MDM   1. Community acquired pneumonia of left lower lobe of lung ([place])    New Prescriptions   ALBUTEROL (PROVENTIL HFA;VENTOLIN HFA) 108 (90 BASE) MCG/ACT INHALER    Inhale 1-2 puffs into the lungs every 6 (six) hours as needed for wheezing or shortness of breath. Used with a spacer   AZITHROMYCIN (ZITHROMAX) 250 MG TABLET    Take 1 tablet (250 mg total) by mouth daily. Take first 2 tablets together, then 1 every day until finished.  Plan: 1. Test/x-ray results and diagnosis reviewed with patient 2. rx as per orders; risks, benefits, potential side  effects reviewed with patient 3. Recommend supportive treatment with Rest and fluids. Use Tylenol or Motrin for pain and fevers. I've given her albuterol for any shortness of breath. She will follow-up with her primary care physician in 3-4 weeks for cure with another x-ray. 4. F/u prn if symptoms worsen or don't improve     Lorin Picket, PA-C 04/06/16 2009

## 2016-05-19 ENCOUNTER — Other Ambulatory Visit: Payer: Medicare Other

## 2016-05-19 ENCOUNTER — Ambulatory Visit: Payer: Medicare Other

## 2016-05-20 ENCOUNTER — Ambulatory Visit: Payer: Medicare Other | Admitting: Oncology

## 2016-06-02 ENCOUNTER — Ambulatory Visit
Admission: RE | Admit: 2016-06-02 | Discharge: 2016-06-02 | Disposition: A | Discharge status: Home / Self Care | Payer: Medicare Other | Source: Ambulatory Visit | Attending: Oncology | Admitting: Oncology

## 2016-06-02 ENCOUNTER — Inpatient Hospital Stay: Payer: Medicare Other | Attending: Oncology

## 2016-06-02 DIAGNOSIS — Z902 Acquired absence of lung [part of]: Secondary | ICD-10-CM | POA: Insufficient documentation

## 2016-06-02 DIAGNOSIS — I517 Cardiomegaly: Secondary | ICD-10-CM | POA: Insufficient documentation

## 2016-06-02 DIAGNOSIS — Z8041 Family history of malignant neoplasm of ovary: Secondary | ICD-10-CM | POA: Diagnosis not present

## 2016-06-02 DIAGNOSIS — I2510 Atherosclerotic heart disease of native coronary artery without angina pectoris: Secondary | ICD-10-CM | POA: Insufficient documentation

## 2016-06-02 DIAGNOSIS — R918 Other nonspecific abnormal finding of lung field: Secondary | ICD-10-CM | POA: Diagnosis not present

## 2016-06-02 DIAGNOSIS — I10 Essential (primary) hypertension: Secondary | ICD-10-CM | POA: Diagnosis not present

## 2016-06-02 DIAGNOSIS — Z87891 Personal history of nicotine dependence: Secondary | ICD-10-CM | POA: Insufficient documentation

## 2016-06-02 DIAGNOSIS — C3411 Malignant neoplasm of upper lobe, right bronchus or lung: Secondary | ICD-10-CM

## 2016-06-02 DIAGNOSIS — F329 Major depressive disorder, single episode, unspecified: Secondary | ICD-10-CM | POA: Diagnosis not present

## 2016-06-02 DIAGNOSIS — I700 Atherosclerosis of aorta: Secondary | ICD-10-CM | POA: Insufficient documentation

## 2016-06-02 DIAGNOSIS — K219 Gastro-esophageal reflux disease without esophagitis: Secondary | ICD-10-CM | POA: Diagnosis not present

## 2016-06-02 DIAGNOSIS — N6331 Unspecified lump in axillary tail of the right breast: Secondary | ICD-10-CM | POA: Insufficient documentation

## 2016-06-02 DIAGNOSIS — K760 Fatty (change of) liver, not elsewhere classified: Secondary | ICD-10-CM | POA: Diagnosis not present

## 2016-06-02 LAB — CREATININE, SERUM
Creatinine, Ser: 0.78 mg/dL (ref 0.44–1.00)
GFR calc Af Amer: >60 mL/min (ref >60)
GFR calc non Af Amer: >60 mL/min (ref >60)

## 2016-06-02 MED ORDER — IOPAMIDOL (ISOVUE-300) INJECTION 61%
75.0000 mL | Freq: Once | INTRAVENOUS | Status: AC | PRN
  Administered 2016-06-02: 75 mL via INTRAVENOUS

## 2016-06-02 NOTE — Progress Notes (Signed)
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  INTERVAL HISTORY: Patient returns to clinic today for further evaluation and discussion of her imaging results. She continues to feel well and is asymptomatic. She has a non-tender "bump" in her right axilla. She has no neurologic complaints. She has a good appetite and denies weight loss. She has no chest pain, shortness of breath, cough, or hemoptysis. She denies any nausea, vomiting, constipation, or diarrhea. She has no urinary complaints. Patient offers no further specific complaints today.  REVIEW OF SYSTEMS:   Review of Systems  Constitutional: Negative.  Negative for fever, malaise/fatigue and weight loss.  Respiratory: Negative.  Negative for cough, hemoptysis and shortness of breath.   Cardiovascular: Negative.  Negative for chest pain and leg swelling.  Gastrointestinal: Negative.  Negative for abdominal pain.  Genitourinary: Negative.   Musculoskeletal: Negative.   Neurological: Negative.  Negative for weakness.  Psychiatric/Behavioral: Negative.  The patient is not nervous/anxious.     As per HPI. Otherwise, a complete review of systems is negative.  PAST MEDICAL HISTORY: Past Medical History:  Diagnosis Date  . Allergy   . Depression   . Fatty liver disease, nonalcoholic   . GERD (gastroesophageal reflux disease)   . Hypertension   . Lung cancer ([place]) 02/2014   RUL Lobectomy    PAST SURGICAL HISTORY: Past Surgical History:  Procedure Laterality Date  . ABDOMINAL HYSTERECTOMY    . LAPAROSCOPIC HYSTERECTOMY  1977  . LARYNGOSCOPY N/A 07/22/2014   Procedure: JET LARYNGOSCOPY with right vocal cord biopsy;  Surgeon: Margaretha Sheffield, MD;  Location: [facility] ORS;  Service: ENT;   Laterality: N/A;  . LUNG CANCER SURGERY      FAMILY HISTORY Family History  Problem Relation Age of Onset  . Kidney cancer Cousin   . Cancer Maternal Grandfather   . Cancer Maternal Grandmother        ADVANCED DIRECTIVES:    HEALTH MAINTENANCE: Social History  Substance Use Topics  . Smoking status: Former Smoker    Packs/day: 0.50    Years: 25.00    Types: Cigarettes    Quit date: 07/18/2006  . Smokeless tobacco: Never Used  . Alcohol use 1.2 oz/week    1 Glasses of wine, 1 Cans of beer per week     Comment: occassionally about 4 times a year     Allergies  Allergen Reactions  . Alendronate Other (See Comments)    Esophageal burning  . Sulfa Antibiotics Other (See Comments)  . Ciprofloxacin Other (See Comments)    Other Reaction: GI UPSET  . Diphenhydramine Other (See Comments)    Current Outpatient Prescriptions  Medication Sig Dispense Refill  . benazepril (LOTENSIN) 5 MG tablet Take 1 tablet by mouth daily.    . calcium citrate-vitamin D 500-400 MG-UNIT chewable tablet Chew 1 tablet by mouth daily.    . cetirizine (ZYRTEC) 10 MG tablet Take by mouth.    . Cholecalciferol (D 2000) 2000 UNITS TABS Take 1 tablet by mouth daily.    Marland Kitchen EPIPEN 2-PAK 0.3 MG/0.3ML SOAJ injection     . estradiol (ESTRACE) 0.5 MG tablet     . ibuprofen (ADVIL,MOTRIN) 600 MG tablet Take 1 tablet (600 mg total) by mouth every 8 (eight) hours as needed. 15 tablet 0  .  Multiple Vitamins-Minerals (OCUVITE ADULT 50+ PO) Take 1 tablet by mouth daily.    . Vitamins-Lipotropics (LIPO-FLAVONOID PLUS) TABS Take 1 tablet by mouth daily.    . Azelastine HCl 0.15 % SOLN Place 1-2 sprays into the nose 2 (two) times daily. 30 mL 0  . sodium chloride (OCEAN) 0.65 % SOLN nasal spray Place 2 sprays into both nostrils every 2 (two) hours while awake.  0   No current facility-administered medications for this visit.     OBJECTIVE: Vitals:   06/03/16 1515  BP: 128/75  Pulse: 65  Temp: 98.6 F (37 C)       Body mass index is 25.91 kg/m.    ECOG FS:0 - Asymptomatic  General: Well-developed, well-nourished, no acute distress. Eyes: Pink conjunctiva, anicteric sclera. Lungs: Clear to auscultation bilaterally. Heart: Regular rate andstended. No organomegaly noted, normoactive bowel sounds. Musculoskeletal: No edema, cyanosis, or clubbing. Neuro: Alert, answering all questions appropriately. Cranial nerves grossly intact. Skin: No rashes or petechiae noted. Psych: Normal affect.   LAB RESULTS:  Lab Results  Component Value Date   NA 136 05/03/2014   K 3.6 05/03/2014   CL 104 05/03/2014   CO2 27 05/03/2014   GLUCOSE 112 (H) 05/03/2014   BUN 8 05/03/2014   CREATININE 0.78 06/02/2016   CALCIUM 8.0 (L) 05/03/2014   PROT 8.5 (H) 03/18/2012   ALBUMIN 4.1 03/18/2012   AST 26 03/18/2012   ALT 25 03/18/2012   ALKPHOS 94 03/18/2012   BILITOT 1.2 (H) 03/18/2012   GFRNONAA >60 06/02/2016   GFRAA >60 06/02/2016    Lab Results  Component Value Date   WBC 12.2 (H) 05/03/2014   NEUTROABS 9.7 (H) 05/03/2014   HGB 10.8 (L) 05/03/2014   HCT 33.2 (L) 05/03/2014   MCV 92 05/03/2014   PLT 163 05/03/2014     STUDIES: Ct Chest W Contrast  Result Date: 06/02/2016 CLINICAL DATA:  Right upper lobectomy in 2016.  Ex-smoker. EXAM: CT CHEST WITH CONTRAST TECHNIQUE: Multidetector CT imaging of the chest was performed during intravenous contrast administration. CONTRAST:  74m ISOVUE-300 IOPAMIDOL (ISOVUE-300) INJECTION 61% COMPARISON:  04/06/2016 plain films.  Most recent CT of 11/25/2015. FINDINGS: Cardiovascular: Aortic and branch vessel atherosclerosis. Tortuous thoracic aorta. Mild cardiomegaly. Left atrial enlargement. Lad coronary artery atherosclerosis. No central pulmonary embolism, on this non-dedicated study. Mediastinum/Nodes: Similar small left low jugular/supraclavicular nodes. No mediastinal or hilar adenopathy. Lungs/Pleura: No pleural fluid. Status post right upper lobectomy. Mild  centrilobular emphysema. Left apical pleural-parenchymal scarring. Subpleural right middle lobe 3 mm pulmonary nodule is unchanged on image 71/series 3. Right lower lobe 5 mm nodule on image 116/series 3, similar. More anterior and lateral right lower lobe 7 mm nodule is unchanged on image 109/ series 3. Minimal subpleural right lower lobe nodularity is similar, including on image 80/series 3. Similar 2 mm left lower lobe pulmonary nodule on image 59/series 3. 2 mm left upper lobe pulmonary nodule on image 36/series 3 is unchanged. Upper Abdomen: Normal imaged portions of the liver, spleen, stomach, adrenal glands, left kidney, gallbladder. Musculoskeletal: Bilateral breast implants. Right rib defects are likely postsurgical. IMPRESSION: 1. Status post right upper lobectomy, without recurrent or metastatic disease. 2. Similar bilateral pulmonary nodules. 3.  Coronary artery atherosclerosis. Aortic atherosclerosis. Electronically Signed   By: KAbigail MiyamotoM.D.   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  PLAN:     1. 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 Pathology and surgical report reviewed independently.  CT scan results from June 02, 2016 reviewed independently and reported as above with no evidence of progressive disease and stable pulmonary nodules. Will continue to watch these nodules closely. They are too small to biopsy and under the sensitivity for a PET scan.  Will restage patient every 6 months for 2 years after her surgery and then yearly after that for 5 years. Return to clinic in 6 months for repeat imaging and further evaluation.  2. Hypertension: Patient's blood pressure mildly elevated today. Monitor. 3. Right axilla: "Bump" appears to be a resolving folliculitis. No intervention needed.  Patient expressed understanding and was in agreement with this plan. She also  understands that She can call clinic at any time with any questions, concerns, or complaints.    Lloyd Huger, MD   06/05/2016 10:46 PM

## 2016-06-03 ENCOUNTER — Inpatient Hospital Stay (HOSPITAL_BASED_OUTPATIENT_CLINIC_OR_DEPARTMENT_OTHER): Payer: Medicare Other | Admitting: Oncology

## 2016-06-03 VITALS — BP 128/75 | HR 65 | Temp 98.6°F | Wt 160.5 lb

## 2016-06-03 DIAGNOSIS — I10 Essential (primary) hypertension: Secondary | ICD-10-CM | POA: Diagnosis not present

## 2016-06-03 DIAGNOSIS — I2510 Atherosclerotic heart disease of native coronary artery without angina pectoris: Secondary | ICD-10-CM | POA: Diagnosis not present

## 2016-06-03 DIAGNOSIS — N6331 Unspecified lump in axillary tail of the right breast: Secondary | ICD-10-CM | POA: Diagnosis not present

## 2016-06-03 DIAGNOSIS — I517 Cardiomegaly: Secondary | ICD-10-CM | POA: Diagnosis not present

## 2016-06-03 DIAGNOSIS — C3411 Malignant neoplasm of upper lobe, right bronchus or lung: Secondary | ICD-10-CM

## 2016-06-03 DIAGNOSIS — Z8041 Family history of malignant neoplasm of ovary: Secondary | ICD-10-CM

## 2016-06-03 DIAGNOSIS — I700 Atherosclerosis of aorta: Secondary | ICD-10-CM | POA: Diagnosis not present

## 2016-06-03 DIAGNOSIS — K760 Fatty (change of) liver, not elsewhere classified: Secondary | ICD-10-CM | POA: Diagnosis not present

## 2016-06-03 DIAGNOSIS — F329 Major depressive disorder, single episode, unspecified: Secondary | ICD-10-CM

## 2016-06-03 DIAGNOSIS — Z87891 Personal history of nicotine dependence: Secondary | ICD-10-CM

## 2016-06-03 DIAGNOSIS — K219 Gastro-esophageal reflux disease without esophagitis: Secondary | ICD-10-CM

## 2016-06-03 NOTE — Progress Notes (Signed)
Patient here today for follow up.  Patient c/o a "bump" under arm that she is concerned about

## 2016-06-14 IMAGING — CR DG CHEST 2V
1 series · 2 of 2 positions shown · non-contrast
Comparison: PET scan of April 04, 2014.

CLINICAL DATA: Right upper lobe lung cancer.

EXAM:
CHEST  2 VIEW

[Series 1: dxr chest pa (or ap) and lateral · 0.14mm/px · 2 of 2 slices shown]
[im 1/2]
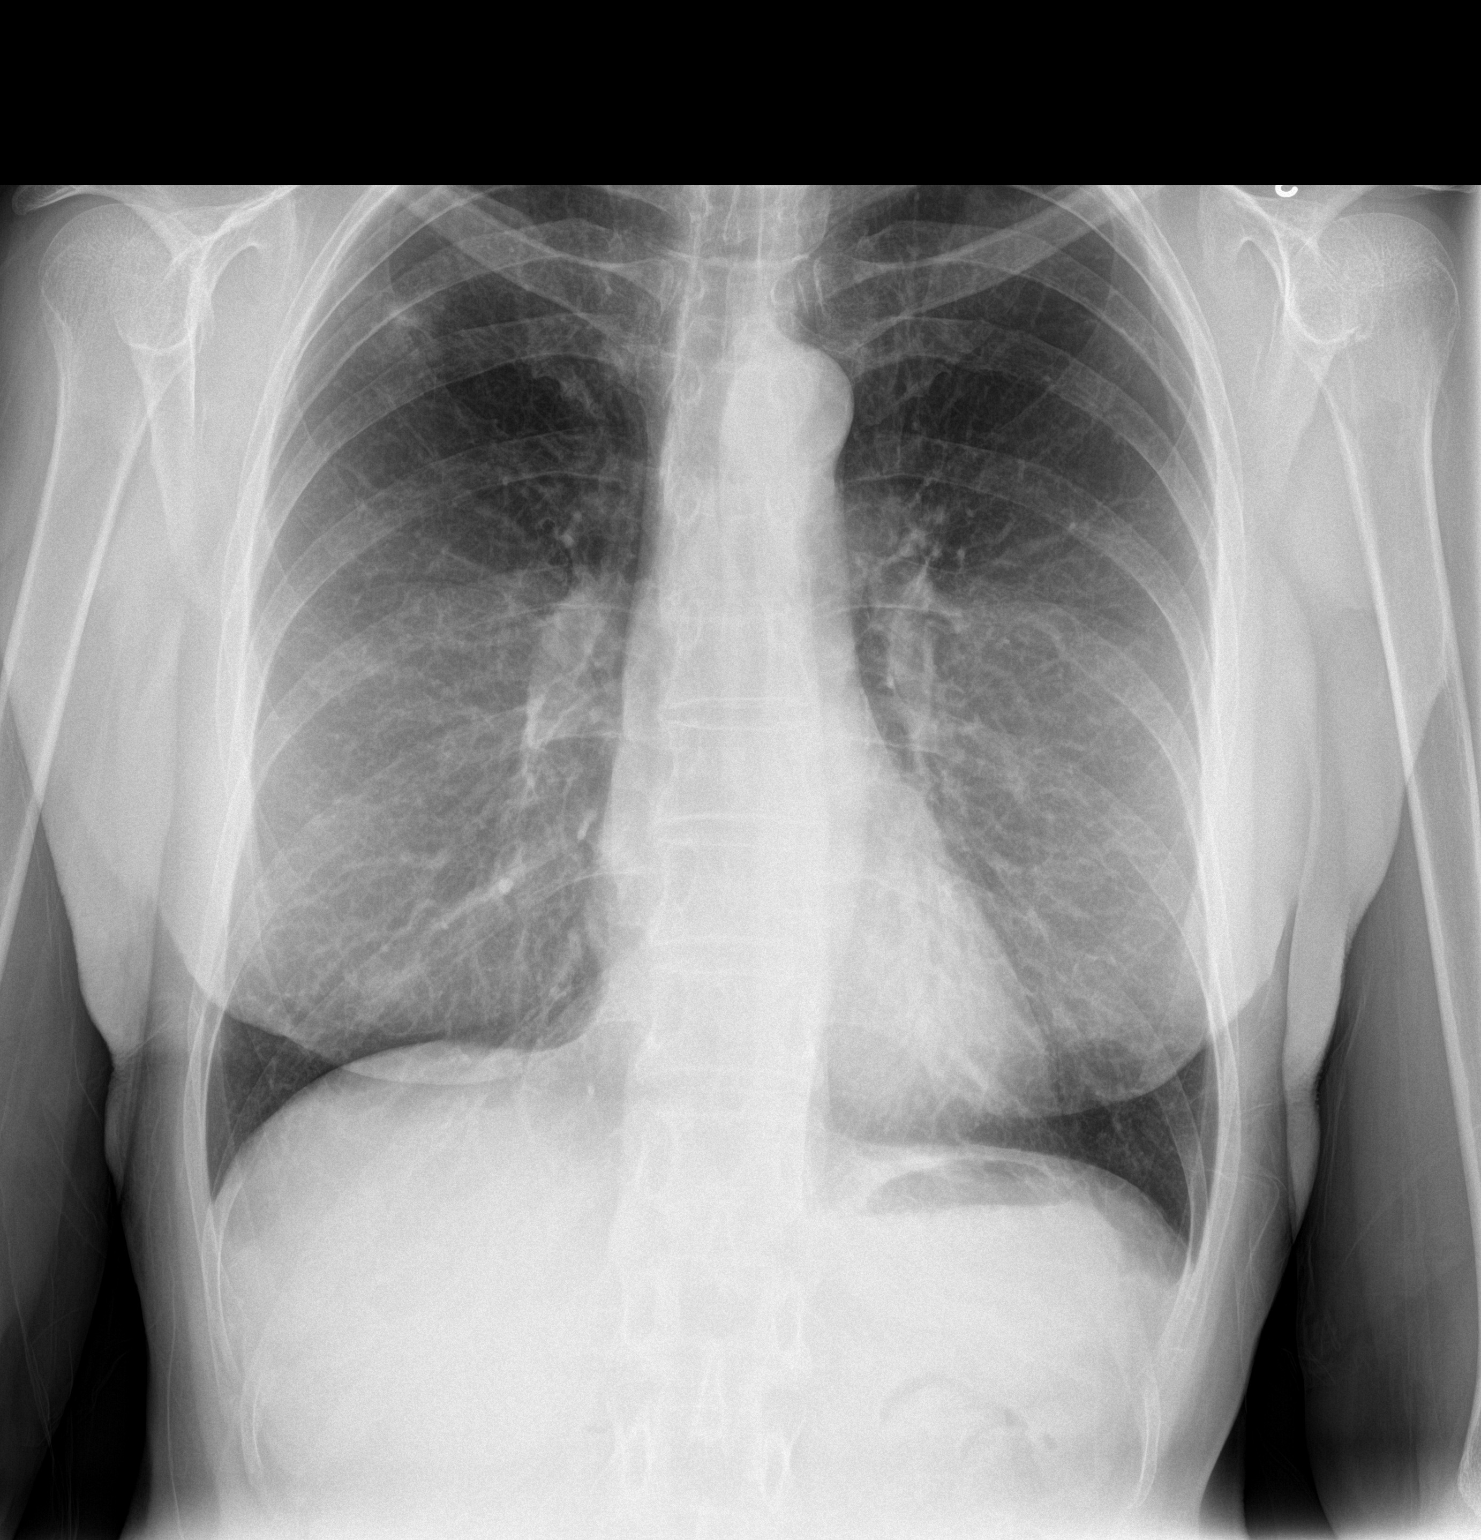
[im 2/2]
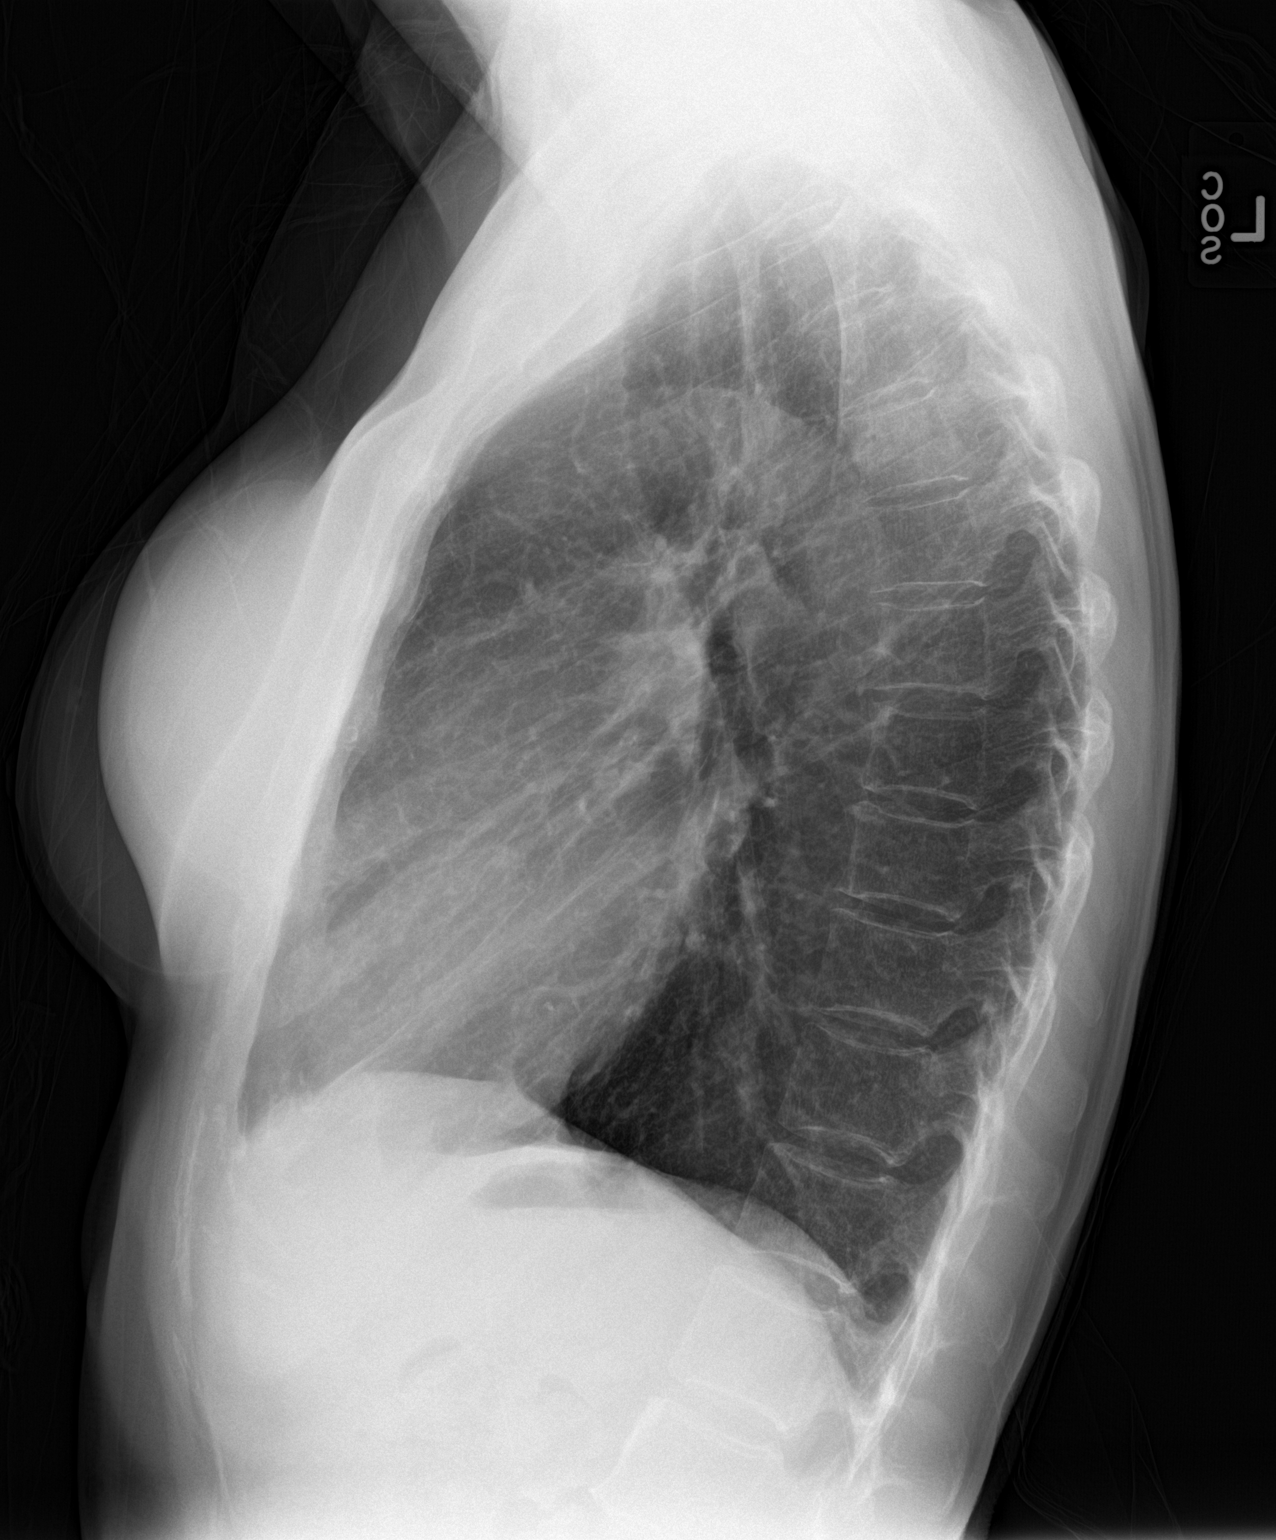

[2 of 2 positions shown; findings below may reference images not displayed]

FINDINGS: The heart size and mediastinal contours are within normal limits. No
pneumothorax or pleural effusion is noted. Left lung is clear.
Ill-defined density is seen in right upper lobe which corresponds to
neoplasm seen on prior PET scan. The visualized skeletal structures
are unremarkable.
IMPRESSION: Ill-defined right upper lobe mass is again noted consistent with
history of neoplasm. No pneumonia or other abnormality is noted.

## 2016-07-04 ENCOUNTER — Ambulatory Visit
Admission: EM | Admit: 2016-07-04 | Discharge: 2016-07-04 | Disposition: A | Discharge status: Home / Self Care | Payer: Medicare Other | Attending: Family Medicine | Admitting: Family Medicine

## 2016-07-04 ENCOUNTER — Encounter: Payer: Self-pay | Admitting: *Deleted

## 2016-07-04 DIAGNOSIS — R1032 Left lower quadrant pain: Secondary | ICD-10-CM | POA: Diagnosis not present

## 2016-07-04 LAB — COMPREHENSIVE METABOLIC PANEL
ALT: 14 U/L (ref 14–54)
AST: 26 U/L (ref 15–41)
Albumin: 3.9 g/dL (ref 3.5–5.0)
Alkaline Phosphatase: 64 U/L (ref 38–126)
Anion gap: 8 (ref 5–15)
BUN: 19 mg/dL (ref 6–20)
CO2: 29 mmol/L (ref 22–32)
Calcium: 9.6 mg/dL (ref 8.9–10.3)
Chloride: 99 mmol/L — ABNORMAL LOW (ref 101–111)
Creatinine, Ser: 0.95 mg/dL (ref 0.44–1.00)
GFR calc Af Amer: >60 mL/min (ref >60)
GFR calc non Af Amer: 56 mL/min — ABNORMAL LOW (ref >60)
Glucose, Bld: 70 mg/dL (ref 65–99)
Potassium: 3.9 mmol/L (ref 3.5–5.1)
Sodium: 136 mmol/L (ref 135–145)
Total Bilirubin: 0.8 mg/dL (ref 0.3–1.2)
Total Protein: 8.2 g/dL — ABNORMAL HIGH (ref 6.5–8.1)

## 2016-07-04 LAB — CBC WITH DIFFERENTIAL/PLATELET
Basophils Absolute: 0.1 10*3/uL (ref 0–0.1)
Basophils Relative: 1 %
Eosinophils Absolute: 0.2 10*3/uL (ref 0–0.7)
Eosinophils Relative: 2 %
HCT: 39.8 % (ref 35.0–47.0)
Hemoglobin: 13.3 g/dL (ref 12.0–16.0)
Lymphocytes Relative: 11 %
Lymphs Abs: 1.1 10*3/uL (ref 1.0–3.6)
MCH: 30.5 pg (ref 26.0–34.0)
MCHC: 33.4 g/dL (ref 32.0–36.0)
MCV: 91 fL (ref 80.0–100.0)
Monocytes Absolute: 0.7 10*3/uL (ref 0.2–0.9)
Monocytes Relative: 7 %
Neutro Abs: 8.1 10*3/uL — ABNORMAL HIGH (ref 1.4–6.5)
Neutrophils Relative %: 79 %
Platelets: 223 10*3/uL (ref 150–440)
RBC: 4.37 MIL/uL (ref 3.80–5.20)
RDW: 13.2 % (ref 11.5–14.5)
WBC: 10.3 10*3/uL (ref 3.6–11.0)

## 2016-07-04 LAB — URINALYSIS, COMPLETE (UACMP) WITH MICROSCOPIC
Bacteria, UA: NONE SEEN
Bilirubin Urine: NEGATIVE
Glucose, UA: NEGATIVE mg/dL
Hgb urine dipstick: NEGATIVE
Ketones, ur: NEGATIVE mg/dL
Leukocytes, UA: NEGATIVE
Nitrite: NEGATIVE
Protein, ur: NEGATIVE mg/dL
RBC / HPF: NONE SEEN RBC/hpf (ref 0–5)
Specific Gravity, Urine: 1.015 (ref 1.005–1.030)
WBC, UA: NONE SEEN WBC/hpf (ref 0–5)
pH: 6 (ref 5.0–8.0)

## 2016-07-04 MED ORDER — AMOXICILLIN-POT CLAVULANATE 875-125 MG PO TABS: 1.0000 | ORAL_TABLET | Freq: Two times a day (BID) | ORAL | 0 refills | Status: DC

## 2016-07-04 MED ORDER — ONDANSETRON 8 MG PO TBDP: 8.0000 mg | ORAL_TABLET | Freq: Three times a day (TID) | ORAL | 0 refills | Status: DC | PRN

## 2016-07-04 MED ORDER — ONDANSETRON 8 MG PO TBDP
8.0000 mg | ORAL_TABLET | Freq: Once | ORAL | Status: AC
  Administered 2016-07-04: 8 mg via ORAL

## 2016-07-04 NOTE — ED Provider Notes (Signed)
[facility]    CSN: [id number] Arrival date & time: 07/04/16  0802     History   Chief Complaint Chief Complaint  Patient presents with  . Abdominal Pain  . Nausea    HPI Christy [name] is a 76 y.o. female.   The history is provided by the patient.  Abdominal Pain  Pain location:  LUQ and LLQ (mainly left lower quadrant) Pain quality: aching   Pain radiates to:  Does not radiate Pain severity:  Mild Timing:  Constant Progression:  Worsening Chronicity:  New Context: not alcohol use, not awakening from sleep, not diet changes, not eating, not laxative use, not medication withdrawal, not previous surgeries, not recent illness, not recent sexual activity, not recent travel, not retching, not sick contacts, not suspicious food intake and not trauma   Relieved by:  Nothing Worsened by:  Palpation Ineffective treatments:  OTC medications Associated symptoms: anorexia and nausea   Associated symptoms: no belching, no chest pain, no chills, no constipation, no cough, no diarrhea, no dysuria, no fatigue, no fever, no flatus, no hematemesis, no hematochezia, no hematuria, no melena, no shortness of breath, no sore throat, no vaginal bleeding, no vaginal discharge and no vomiting   Risk factors: being elderly   Risk factors comment:  H/o diverticulosis   Past Medical History:  Diagnosis Date  . Allergy   . Depression   . Fatty liver disease, nonalcoholic   . GERD (gastroesophageal reflux disease)   . Hypertension   . Lung cancer ([place]) 02/2014   RUL Lobectomy    Patient Active Problem List   Diagnosis Date Noted  . SUI (stress urinary incontinence, female) 10/15/2014  . Polypharmacy 09/02/2014  . Primary cancer of right upper lobe of lung ([place]) 07/19/2014  . Acid reflux 07/18/2014  . 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 Chronic kidney disease (CKD), stage III (moderate) 08/30/2013  . Benign essential HTN 08/30/2013    . Fatty liver disease, nonalcoholic 12/45/8099  . Fibrositis 08/30/2013  . Hot flash, menopausal 08/30/2013  . Billowing mitral valve 08/30/2013  . Allergic rhinitis, seasonal 08/30/2013  . Fibromyalgia 08/30/2013    Past Surgical History:  Procedure Laterality Date  . ABDOMINAL HYSTERECTOMY    . LAPAROSCOPIC HYSTERECTOMY  1977  . LARYNGOSCOPY N/A 07/22/2014   Procedure: JET LARYNGOSCOPY with right vocal cord biopsy;  Surgeon: Margaretha Sheffield, MD;  Location: [facility] OR[facility];  Service: ENT;  Laterality: N/A;  . LUNG CANCER SURGERY      OB History    No data available       Home Medications    Prior to Admission medications   Medication Sig Start Date End Date Taking? Authorizing Provider  benazepril (LOTENSIN) 5 MG tablet Take 1 tablet by mouth daily. 03/04/14  Yes [provider]  calcium citrate-vitamin D 500-400 MG-UNIT chewable tablet Chew 1 tablet by mouth daily.   Yes [provider]  Cholecalciferol (D 2000) 2000 UNITS TABS Take 1 tablet by mouth daily.   Yes [provider]  estradiol (ESTRACE) 0.5 MG tablet  03/05/15  Yes [provider]  Multiple Vitamins-Minerals (OCUVITE ADULT 50+ PO) Take 1 tablet by mouth daily.   Yes [provider]  amoxicillin-clavulanate (AUGMENTIN) 875-125 MG tablet Take 1 tablet by mouth 2 (two) times daily. 07/04/16   Norval Gable, MD  Azelastine HCl 0.15 % SOLN Place 1-2 sprays into the nose 2 (two) times daily. 01/14/16 02/13/16  Betancourt, Aura Fey, NP  cetirizine (ZYRTEC) 10 MG tablet Take by mouth.    [provider]  EPIPEN 2-PAK 0.3 MG/0.3ML SOAJ injection  05/17/15   [provider]  ibuprofen (ADVIL,MOTRIN) 600 MG tablet Take 1 tablet (600 mg total) by mouth every 8 (eight) hours as needed. 02/02/15   Paulette Blanch, MD  ondansetron (ZOFRAN ODT) 8 MG disintegrating tablet Take 1 tablet (8 mg total) by mouth every 8 (eight) hours as needed. 07/04/16   Norval Gable, MD  sodium chloride  (OCEAN) 0.65 % SOLN nasal spray Place 2 sprays into both nostrils every 2 (two) hours while awake. 01/14/16 02/13/16  Betancourt, Aura Fey, NP  Vitamins-Lipotropics (LIPO-FLAVONOID PLUS) TABS Take 1 tablet by mouth daily.    [provider]    Family History Family History  Problem Relation Age of Onset  . Kidney cancer Cousin   . Cancer Maternal Grandfather   . Cancer Maternal Grandmother     Social History Social History  Substance Use Topics  . Smoking status: Former Smoker    Packs/day: 0.50    Years: 25.00    Types: Cigarettes    Quit date: 07/18/2006  . Smokeless tobacco: Never Used  . Alcohol use 1.2 oz/week    1 Glasses of wine, 1 Cans of beer per week     Comment: occassionally about 4 times a year     Allergies   Alendronate; Sulfa antibiotics; Ciprofloxacin; and Diphenhydramine   Review of Systems Review of Systems  Constitutional: Negative for chills, fatigue and fever.  HENT: Negative for sore throat.   Respiratory: Negative for cough and shortness of breath.   Cardiovascular: Negative for chest pain.  Gastrointestinal: Positive for abdominal pain, anorexia and nausea. Negative for constipation, diarrhea, flatus, hematemesis, hematochezia, melena and vomiting.  Genitourinary: Negative for dysuria, hematuria, vaginal bleeding and vaginal discharge.     Physical Exam Triage Vital Signs ED Triage Vitals  Enc Vitals Group     BP 07/04/16 0821 (!) 103/59     Pulse Rate 07/04/16 0821 71     Resp 07/04/16 0821 16     Temp 07/04/16 0821 97.8 F (36.6 C)     Temp Source 07/04/16 0821 Oral     SpO2 07/04/16 0821 100 %     Weight 07/04/16 0823 157 lb (71.2 kg)     Height 07/04/16 0823 5\' 6"  (1.676 m)     Head Circumference --      Peak Flow --      Pain Score 07/04/16 0824 4     Pain Loc --      Pain Edu? --      Excl. in [place]? --    No data found.   Updated Vital Signs BP (!) 103/59 (BP Location: Left Arm)   Pulse 71   Temp 97.8 F (36.6 C)  (Oral)   Resp 16   Ht 5\' 6"  (1.676 m)   Wt 157 lb (71.2 kg)   SpO2 100%   BMI 25.34 kg/m   Visual Acuity Right Eye Distance:   Left Eye Distance:   Bilateral Distance:    Right Eye Near:   Left Eye Near:    Bilateral Near:     Physical Exam  Constitutional: She appears well-developed and well-nourished. No distress.  Abdominal: Soft. Bowel sounds are normal. She exhibits no distension and no mass. There is tenderness (mild left mid and lower quadrant; no rebound or guarding). There is no rebound and no guarding.  Skin: She  is not diaphoretic.  Nursing note and vitals reviewed.    UC Treatments / Results  Labs (all labs ordered are listed, but only abnormal results are displayed) Labs Reviewed  COMPREHENSIVE METABOLIC PANEL - Abnormal; Notable for the following:       Result Value   Chloride 99 (*)    Total Protein 8.2 (*)    GFR calc non Af Amer 56 (*)    All other components within normal limits  CBC WITH DIFFERENTIAL/PLATELET - Abnormal; Notable for the following:    Neutro Abs 8.1 (*)    All other components within normal limits  URINALYSIS, COMPLETE (UACMP) WITH MICROSCOPIC - Abnormal; Notable for the following:    Squamous Epithelial / LPF 0-5 (*)    All other components within normal limits    EKG  EKG Interpretation None       Radiology No results found.  Procedures Procedures (including critical care time)  Medications Ordered in UC Medications  ondansetron (ZOFRAN-ODT) disintegrating tablet 8 mg (8 mg Oral Given 07/04/16 0849)     Initial Impression / Assessment and Plan / UC Course  I have reviewed the triage vital signs and the nursing notes.  Pertinent labs & imaging results that were available during my care of the patient were reviewed by me and considered in my medical decision making (see chart for details).       Final Clinical Impressions(s) / UC Diagnoses   Final diagnoses:  Left lower quadrant pain  (possible early  diverticulitis)   New Prescriptions Discharge Medication List as of 07/04/2016  9:44 AM    START taking these medications   Details  amoxicillin-clavulanate (AUGMENTIN) 875-125 MG tablet Take 1 tablet by mouth 2 (two) times daily., Starting Sun 07/04/2016, Normal    ondansetron (ZOFRAN ODT) 8 MG disintegrating tablet Take 1 tablet (8 mg total) by mouth every 8 (eight) hours as needed., Starting Sun 07/04/2016, Normal       1. Lab results and diagnosis reviewed with patient 2. rx as per orders above; reviewed possible side effects, interactions, risks and benefits  3. Recommend supportive treatment with clear liquids, then advance diet slowly as tolerated  4. Follow-up prn if symptoms worsen or don't improve   Norval Gable, MD 07/04/16 1038

## 2016-07-04 NOTE — ED Triage Notes (Signed)
Left side abd pain and morning nausea x 2 days. Denies other symptoms.

## 2016-11-15 ENCOUNTER — Ambulatory Visit
Admission: RE | Admit: 2016-11-15 | Discharge: 2016-11-15 | Disposition: A | Discharge status: Home / Self Care | Payer: Medicare Other | Source: Ambulatory Visit | Attending: Oncology | Admitting: Oncology

## 2016-11-15 DIAGNOSIS — R918 Other nonspecific abnormal finding of lung field: Secondary | ICD-10-CM | POA: Diagnosis not present

## 2016-11-15 DIAGNOSIS — Z9882 Breast implant status: Secondary | ICD-10-CM | POA: Insufficient documentation

## 2016-11-15 DIAGNOSIS — I700 Atherosclerosis of aorta: Secondary | ICD-10-CM | POA: Diagnosis not present

## 2016-11-15 DIAGNOSIS — Z08 Encounter for follow-up examination after completed treatment for malignant neoplasm: Secondary | ICD-10-CM | POA: Insufficient documentation

## 2016-11-15 DIAGNOSIS — Z902 Acquired absence of lung [part of]: Secondary | ICD-10-CM | POA: Insufficient documentation

## 2016-11-15 DIAGNOSIS — I2510 Atherosclerotic heart disease of native coronary artery without angina pectoris: Secondary | ICD-10-CM | POA: Diagnosis not present

## 2016-11-15 DIAGNOSIS — Z85118 Personal history of other malignant neoplasm of bronchus and lung: Secondary | ICD-10-CM | POA: Insufficient documentation

## 2016-11-15 DIAGNOSIS — C3411 Malignant neoplasm of upper lobe, right bronchus or lung: Secondary | ICD-10-CM

## 2016-11-15 LAB — POCT I-STAT CREATININE: Creatinine, Ser: 0.9 mg/dL (ref 0.44–1.00)

## 2016-11-15 MED ORDER — IOPAMIDOL (ISOVUE-300) INJECTION 61%
75.0000 mL | Freq: Once | INTRAVENOUS | Status: AC | PRN
  Administered 2016-11-15: 75 mL via INTRAVENOUS

## 2016-11-16 NOTE — Progress Notes (Signed)
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  INTERVAL HISTORY: Patient returns to clinic today for further evaluation and discussion of her imaging results. She continues to feel well and is asymptomatic. She has no neurologic complaints. She has a good appetite and denies weight loss. She has no chest pain, shortness of breath, cough, or hemoptysis. She denies any nausea, vomiting, constipation, or diarrhea. She has no urinary complaints. Patient offers no specific complaints today.  REVIEW OF SYSTEMS:   Review of Systems  Constitutional: Negative.  Negative for fever, malaise/fatigue and weight loss.  Respiratory: Negative.  Negative for cough, hemoptysis and shortness of breath.   Cardiovascular: Negative.  Negative for chest pain and leg swelling.  Gastrointestinal: Negative.  Negative for abdominal pain.  Genitourinary: Negative.   Musculoskeletal: Negative.   Skin: Negative.  Negative for rash.  Neurological: Negative.  Negative for weakness.  Psychiatric/Behavioral: Negative.  The patient is not nervous/anxious.     As per HPI. Otherwise, a complete review of systems is negative.  PAST MEDICAL HISTORY: Past Medical History:  Diagnosis Date  . Allergy   . Depression   . Fatty liver disease, nonalcoholic   . GERD (gastroesophageal reflux disease)   . Hypertension   . Lung cancer ([place]) 02/2014   RUL Lobectomy    PAST SURGICAL HISTORY: Past Surgical History:  Procedure Laterality Date  . ABDOMINAL HYSTERECTOMY    . LAPAROSCOPIC HYSTERECTOMY  1977  . LARYNGOSCOPY N/A 07/22/2014   Procedure: JET LARYNGOSCOPY with right vocal cord biopsy;  Surgeon: Margaretha Sheffield, MD;  Location: [facility] ORS;  Service: ENT;  Laterality: N/A;  .  LUNG CANCER SURGERY      FAMILY HISTORY Family History  Problem Relation Age of Onset  . Kidney cancer Cousin   . Cancer Maternal Grandfather   . Cancer Maternal Grandmother        ADVANCED DIRECTIVES:    HEALTH MAINTENANCE: Social History  Substance Use Topics  . Smoking status: Former Smoker    Packs/day: 0.50    Years: 25.00    Types: Cigarettes    Quit date: 07/18/2006  . Smokeless tobacco: Never Used  . Alcohol use 1.2 oz/week    1 Glasses of wine, 1 Cans of beer per week     Comment: occassionally about 4 times a year     Allergies  Allergen Reactions  . Alendronate Other (See Comments)    Esophageal burning  . Sulfa Antibiotics Other (See Comments)  . Ciprofloxacin Other (See Comments)    Other Reaction: GI UPSET  . Diphenhydramine Other (See Comments)    Current Outpatient Prescriptions  Medication Sig Dispense Refill  . benazepril (LOTENSIN) 5 MG tablet Take 1 tablet by mouth daily.    . calcium citrate-vitamin D 500-400 MG-UNIT chewable tablet Chew 1 tablet by mouth daily.    . cetirizine (ZYRTEC) 10 MG tablet Take by mouth.    . Cholecalciferol (D 2000) 2000 UNITS TABS Take 1 tablet by mouth daily.    Marland Kitchen EPIPEN 2-PAK 0.3 MG/0.3ML SOAJ injection     . estradiol (ESTRACE) 0.5 MG tablet     . ibuprofen (ADVIL,MOTRIN) 600 MG tablet Take 1 tablet (600 mg total) by mouth every 8 (eight) hours as needed. 15 tablet 0  . Multiple Vitamins-Minerals (  OCUVITE ADULT 50+ PO) Take 1 tablet by mouth daily.    Marland Kitchen amoxicillin-clavulanate (AUGMENTIN) 875-125 MG tablet Take 1 tablet by mouth 2 (two) times daily. (Patient not taking: Reported on 11/18/2016) 14 tablet 0  . Azelastine HCl 0.15 % SOLN Place 1-2 sprays into the nose 2 (two) times daily. 30 mL 0  . ondansetron (ZOFRAN ODT) 8 MG disintegrating tablet Take 1 tablet (8 mg total) by mouth every 8 (eight) hours as needed. (Patient not taking: Reported on 11/18/2016) 6 tablet 0  . sodium chloride (OCEAN) 0.65 % SOLN nasal  spray Place 2 sprays into both nostrils every 2 (two) hours while awake.  0  . Vitamins-Lipotropics (LIPO-FLAVONOID PLUS) TABS Take 1 tablet by mouth daily.     No current facility-administered medications for this visit.     OBJECTIVE: Vitals:   11/18/16 1405 11/18/16 1406  BP:  131/72  Pulse:  68  Resp: 18   Temp: (!) 96.3 F (35.7 C)      Body mass index is 24.66 kg/m.    ECOG FS:0 - Asymptomatic  General: Well-developed, well-nourished, no acute distress. Eyes: Pink conjunctiva, anicteric sclera. Lungs: Clear to auscultation bilaterally. Heart: Regular rate andstended. No organomegaly noted, normoactive bowel sounds. Musculoskeletal: No edema, cyanosis, or clubbing. Neuro: Alert, answering all questions appropriately. Cranial nerves grossly intact. Skin: No rashes or petechiae noted. Psych: Normal affect.   LAB RESULTS:  Lab Results  Component Value Date   NA 136 07/04/2016   K 3.9 07/04/2016   CL 99 (L) 07/04/2016   CO2 29 07/04/2016   GLUCOSE 70 07/04/2016   BUN 19 07/04/2016   CREATININE 0.90 11/15/2016   CALCIUM 9.6 07/04/2016   PROT 8.2 (H) 07/04/2016   ALBUMIN 3.9 07/04/2016   AST 26 07/04/2016   ALT 14 07/04/2016   ALKPHOS 64 07/04/2016   BILITOT 0.8 07/04/2016   GFRNONAA 56 (L) 07/04/2016   GFRAA >60 07/04/2016    Lab Results  Component Value Date   WBC 10.3 07/04/2016   NEUTROABS 8.1 (H) 07/04/2016   HGB 13.3 07/04/2016   HCT 39.8 07/04/2016   MCV 91.0 07/04/2016   PLT 223 07/04/2016     STUDIES: Ct Chest W Contrast  Result Date: 11/15/2016 CLINICAL DATA:  Right upper lobe lung cancer.  Follow-up. EXAM: CT CHEST WITH CONTRAST TECHNIQUE: Multidetector CT imaging of the chest was performed during intravenous contrast administration. CONTRAST:  54mL ISOVUE-300 IOPAMIDOL (ISOVUE-300) INJECTION 61% COMPARISON:  06/02/2016 FINDINGS: Cardiovascular: Advanced aortic and branch vessel atherosclerosis. Tortuous thoracic aorta. Mild cardiomegaly with  left atrial enlargement. Multivessel coronary artery atherosclerosis. No central pulmonary embolism, on this non-dedicated study. Mediastinum/Nodes: No mediastinal or hilar adenopathy. Lungs/Pleura: No pleural fluid.  Status post right upper lobectomy. Bilateral pulmonary nodules are similar. The majority are on the order of 3 mm or less. Right lower lobe pulmonary nodules of up to 6 mm including on images 117 and 123/ series 3, are not significantly changed. Upper Abdomen: Normal imaged portions of the spleen, stomach, pancreas, gallbladder, adrenal glands, kidneys. Abdominal aortic atherosclerosis. Vague hyper enhancement in the lateral segment left liver lobe on image 152/ series 2 may represent a perfusion anomaly. Felt to be present back on 11/25/2014. Musculoskeletal: Bilateral breast implants. Right sided rib defects are likely from prior thoracotomy. IMPRESSION: 1. Status post right upper lobectomy, without recurrent or metastatic disease. 2. Bilateral pulmonary nodules are not significantly changed. 3. Coronary artery atherosclerosis. Aortic Atherosclerosis (ICD10-I70.0). Electronically Signed   By: Abigail Miyamoto  M.D.   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  PLAN:     1. 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 Pathology and surgical report reviewed independently.  CT scan results from November 15, 2016 reviewed independently and reported as above with no evidence of progressive disease and stable pulmonary nodules. Will continue to monitor these nodules closely. They are too small to biopsy and under the sensitivity for a PET scan.  Will restage patient every 6 months for 2 years after her surgery and then yearly after that for 5 years. Return to clinic in 6 months for repeat imaging and further evaluation.  2. Hypertension: Patient's blood pressure is within normal limits today.  Monitor.  Approximately 20 minutes was spent in discussion of which greater than 50% was consultation.  Patient expressed understanding and was in agreement with this plan. She also understands that She can call clinic at any time with any questions, concerns, or complaints.    Lloyd Huger, MD   11/21/2016 8:24 AM

## 2016-11-18 ENCOUNTER — Inpatient Hospital Stay: Payer: Medicare Other | Attending: Oncology | Admitting: Oncology

## 2016-11-18 VITALS — BP 131/72 | HR 68 | Temp 96.3°F | Resp 18 | Wt 152.8 lb

## 2016-11-18 DIAGNOSIS — I10 Essential (primary) hypertension: Secondary | ICD-10-CM | POA: Diagnosis not present

## 2016-11-18 DIAGNOSIS — K760 Fatty (change of) liver, not elsewhere classified: Secondary | ICD-10-CM | POA: Diagnosis not present

## 2016-11-18 DIAGNOSIS — I700 Atherosclerosis of aorta: Secondary | ICD-10-CM

## 2016-11-18 DIAGNOSIS — C3411 Malignant neoplasm of upper lobe, right bronchus or lung: Secondary | ICD-10-CM | POA: Insufficient documentation

## 2016-11-18 DIAGNOSIS — F329 Major depressive disorder, single episode, unspecified: Secondary | ICD-10-CM | POA: Insufficient documentation

## 2016-11-18 DIAGNOSIS — Z79899 Other long term (current) drug therapy: Secondary | ICD-10-CM

## 2016-11-18 DIAGNOSIS — Z8051 Family history of malignant neoplasm of kidney: Secondary | ICD-10-CM | POA: Diagnosis not present

## 2016-11-18 DIAGNOSIS — Z87891 Personal history of nicotine dependence: Secondary | ICD-10-CM | POA: Insufficient documentation

## 2016-11-18 DIAGNOSIS — Z809 Family history of malignant neoplasm, unspecified: Secondary | ICD-10-CM | POA: Insufficient documentation

## 2016-11-18 DIAGNOSIS — I517 Cardiomegaly: Secondary | ICD-10-CM | POA: Diagnosis not present

## 2016-11-18 DIAGNOSIS — K219 Gastro-esophageal reflux disease without esophagitis: Secondary | ICD-10-CM | POA: Insufficient documentation

## 2016-11-18 NOTE — Progress Notes (Signed)
Patient denies any concerns today.  

## 2016-11-24 ENCOUNTER — Encounter: Payer: Self-pay | Admitting: *Deleted

## 2016-11-24 ENCOUNTER — Ambulatory Visit
Admission: EM | Admit: 2016-11-24 | Discharge: 2016-11-24 | Disposition: A | Discharge status: Home / Self Care | Payer: Medicare Other | Attending: Family Medicine | Admitting: Family Medicine

## 2016-11-24 ENCOUNTER — Ambulatory Visit (INDEPENDENT_AMBULATORY_CARE_PROVIDER_SITE_OTHER): Payer: Medicare Other

## 2016-11-24 DIAGNOSIS — B9789 Other viral agents as the cause of diseases classified elsewhere: Secondary | ICD-10-CM

## 2016-11-24 DIAGNOSIS — R32 Unspecified urinary incontinence: Secondary | ICD-10-CM | POA: Diagnosis not present

## 2016-11-24 DIAGNOSIS — R05 Cough: Secondary | ICD-10-CM

## 2016-11-24 DIAGNOSIS — R300 Dysuria: Secondary | ICD-10-CM | POA: Diagnosis not present

## 2016-11-24 DIAGNOSIS — J029 Acute pharyngitis, unspecified: Secondary | ICD-10-CM | POA: Diagnosis not present

## 2016-11-24 DIAGNOSIS — J069 Acute upper respiratory infection, unspecified: Secondary | ICD-10-CM

## 2016-11-24 LAB — CBC WITH DIFFERENTIAL/PLATELET
Basophils Absolute: 0.1 10*3/uL (ref 0–0.1)
Basophils Relative: 1 %
Eosinophils Absolute: 0.1 10*3/uL (ref 0–0.7)
Eosinophils Relative: 1 %
HCT: 38.4 % (ref 35.0–47.0)
Hemoglobin: 12.9 g/dL (ref 12.0–16.0)
Lymphocytes Relative: 7 %
Lymphs Abs: 0.9 10*3/uL — ABNORMAL LOW (ref 1.0–3.6)
MCH: 30.7 pg (ref 26.0–34.0)
MCHC: 33.7 g/dL (ref 32.0–36.0)
MCV: 91.2 fL (ref 80.0–100.0)
Monocytes Absolute: 1.3 10*3/uL — ABNORMAL HIGH (ref 0.2–0.9)
Monocytes Relative: 10 %
Neutro Abs: 10.6 10*3/uL — ABNORMAL HIGH (ref 1.4–6.5)
Neutrophils Relative %: 81 %
Platelets: 180 10*3/uL (ref 150–440)
RBC: 4.21 MIL/uL (ref 3.80–5.20)
RDW: 13.1 % (ref 11.5–14.5)
WBC: 13 10*3/uL — ABNORMAL HIGH (ref 3.6–11.0)

## 2016-11-24 LAB — URINALYSIS, COMPLETE (UACMP) WITH MICROSCOPIC
Bilirubin Urine: NEGATIVE
Glucose, UA: NEGATIVE mg/dL
Hgb urine dipstick: NEGATIVE
Ketones, ur: NEGATIVE mg/dL
Leukocytes, UA: NEGATIVE
Nitrite: NEGATIVE
Protein, ur: NEGATIVE mg/dL
Specific Gravity, Urine: 1.02 (ref 1.005–1.030)
pH: 6 (ref 5.0–8.0)

## 2016-11-24 LAB — COMPREHENSIVE METABOLIC PANEL
ALT: 14 U/L (ref 14–54)
AST: 23 U/L (ref 15–41)
Albumin: 3.6 g/dL (ref 3.5–5.0)
Alkaline Phosphatase: 71 U/L (ref 38–126)
Anion gap: 7 (ref 5–15)
BUN: 19 mg/dL (ref 6–20)
CO2: 26 mmol/L (ref 22–32)
Calcium: 8.6 mg/dL — ABNORMAL LOW (ref 8.9–10.3)
Chloride: 103 mmol/L (ref 101–111)
Creatinine, Ser: 0.83 mg/dL (ref 0.44–1.00)
GFR calc Af Amer: >60 mL/min (ref >60)
GFR calc non Af Amer: >60 mL/min (ref >60)
Glucose, Bld: 103 mg/dL — ABNORMAL HIGH (ref 65–99)
Potassium: 3.5 mmol/L (ref 3.5–5.1)
Sodium: 136 mmol/L (ref 135–145)
Total Bilirubin: 0.6 mg/dL (ref 0.3–1.2)
Total Protein: 7.7 g/dL (ref 6.5–8.1)

## 2016-11-24 LAB — RAPID STREP SCREEN (MED CTR MEBANE ONLY): Streptococcus, Group A Screen (Direct): NEGATIVE

## 2016-11-24 MED ORDER — AMOXICILLIN-POT CLAVULANATE 875-125 MG PO TABS: 1.0000 | ORAL_TABLET | Freq: Two times a day (BID) | ORAL | 0 refills | Status: DC

## 2016-11-24 NOTE — Discharge Instructions (Signed)
Increase fluids, over the counter tylenol as needed

## 2016-11-24 NOTE — ED Provider Notes (Signed)
[facility]    CSN: [id number] Arrival date & time: 11/24/16  0808     History   Chief Complaint Chief Complaint  Patient presents with  . Sore Throat  . Weakness  . Altered Mental Status    HPI Christy [name] is a 76 y.o. female.   76 yo female with a c/o "feeling lousy". States she's been having chills, sore throat, cough, weak, slight discomfort urinating,  and confusion. States that 2 days ago she was very confused, was incontinent of urine which is unusual for her and was putting clothes where they didn't belong in her home.    The history is provided by the patient.  Sore Throat   Weakness  Associated symptoms include altered mental status.  Altered Mental Status  Associated symptoms: weakness     Past Medical History:  Diagnosis Date  . Allergy   . Depression   . Fatty liver disease, nonalcoholic   . GERD (gastroesophageal reflux disease)   . Hypertension   . Lung cancer ([place]) 02/2014   RUL Lobectomy    Patient Active Problem List   Diagnosis Date Noted  . SUI (stress urinary incontinence, female) 10/15/2014  . Polypharmacy 09/02/2014  . Primary cancer of right upper lobe of lung ([place]) 07/19/2014  . Acid reflux 07/18/2014  . 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 Chronic kidney disease (CKD), stage III (moderate) ([place]) 08/30/2013  . Benign essential HTN 08/30/2013  . Fatty liver disease, nonalcoholic 33/29/5188  . Fibrositis 08/30/2013  . Hot flash, menopausal 08/30/2013  . Billowing mitral valve 08/30/2013  . Allergic rhinitis, seasonal 08/30/2013  . Fibromyalgia 08/30/2013    Past Surgical History:  Procedure Laterality Date  . ABDOMINAL HYSTERECTOMY    . LAPAROSCOPIC HYSTERECTOMY  1977  . LARYNGOSCOPY N/A 07/22/2014   Procedure: JET LARYNGOSCOPY with right vocal cord biopsy;  Surgeon: Margaretha Sheffield, MD;  Location: [facility] ORS;  Service: ENT;  Laterality: N/A;  . LUNG CANCER SURGERY       OB History    No data available       Home Medications    Prior to Admission medications   Medication Sig Start Date End Date Taking? Authorizing Provider  benazepril (LOTENSIN) 5 MG tablet Take 1 tablet by mouth daily. 03/04/14  Yes [provider]  calcium citrate-vitamin D 500-400 MG-UNIT chewable tablet Chew 1 tablet by mouth daily.   Yes [provider]  Cholecalciferol (D 2000) 2000 UNITS TABS Take 1 tablet by mouth daily.   Yes [provider]  estradiol (ESTRACE) 0.5 MG tablet  03/05/15  Yes [provider]  ibuprofen (ADVIL,MOTRIN) 600 MG tablet Take 1 tablet (600 mg total) by mouth every 8 (eight) hours as needed. 02/02/15  Yes Paulette Blanch, MD  Multiple Vitamins-Minerals (OCUVITE ADULT 50+ PO) Take 1 tablet by mouth daily.   Yes [provider]  amoxicillin-clavulanate (AUGMENTIN) 875-125 MG tablet Take 1 tablet by mouth 2 (two) times daily. 11/24/16   Norval Gable, MD  Azelastine HCl 0.15 % SOLN Place 1-2 sprays into the nose 2 (two) times daily. 01/14/16 02/13/16  Betancourt, Aura Fey, NP  cetirizine (ZYRTEC) 10 MG tablet Take by mouth.    [provider]  EPIPEN 2-PAK 0.3 MG/0.3ML SOAJ injection  05/17/15   [provider]  ondansetron (ZOFRAN ODT) 8 MG disintegrating tablet Take 1 tablet (8 mg total) by mouth every 8 (eight) hours as needed. Patient not taking: Reported on 11/18/2016  07/04/16   Norval Gable, MD  sodium chloride (OCEAN) 0.65 % SOLN nasal spray Place 2 sprays into both nostrils every 2 (two) hours while awake. 01/14/16 02/13/16  Betancourt, Aura Fey, NP  Vitamins-Lipotropics (LIPO-FLAVONOID PLUS) TABS Take 1 tablet by mouth daily.    [provider]    Family History Family History  Problem Relation Age of Onset  . Kidney cancer Cousin   . Cancer Maternal Grandfather   . Cancer Maternal Grandmother     Social History Social History  Substance Use Topics  . Smoking status: Former  Smoker    Packs/day: 0.50    Years: 25.00    Types: Cigarettes    Quit date: 07/18/2006  . Smokeless tobacco: Never Used  . Alcohol use 1.2 oz/week    1 Glasses of wine, 1 Cans of beer per week     Comment: occassionally about 4 times a year     Allergies   Alendronate; Sulfa antibiotics; Ciprofloxacin; and Diphenhydramine   Review of Systems Review of Systems  Neurological: Positive for weakness.     Physical Exam Triage Vital Signs ED Triage Vitals  Enc Vitals Group     BP 11/24/16 0822 131/62     Pulse Rate 11/24/16 0822 79     Resp 11/24/16 0822 16     Temp 11/24/16 0822 98.4 F (36.9 C)     Temp Source 11/24/16 0822 Oral     SpO2 11/24/16 0822 97 %     Weight --      Height 11/24/16 0824 5\' 6"  (1.676 m)     Head Circumference --      Peak Flow --      Pain Score 11/24/16 0824 0     Pain Loc --      Pain Edu? --      Excl. in [place]? --    No data found.   Updated Vital Signs BP 131/62 (BP Location: Left Arm)   Pulse 79   Temp 98.4 F (36.9 C) (Oral)   Resp 16   Ht 5\' 6"  (1.676 m)   SpO2 97%   Visual Acuity Right Eye Distance:   Left Eye Distance:   Bilateral Distance:    Right Eye Near:   Left Eye Near:    Bilateral Near:     Physical Exam  Constitutional: She appears well-developed and well-nourished. No distress.  HENT:  Head: Normocephalic and atraumatic.  Right Ear: Tympanic membrane, external ear and ear canal normal.  Left Ear: Tympanic membrane, external ear and ear canal normal.  Nose: No mucosal edema, rhinorrhea, nose lacerations, sinus tenderness, nasal deformity, septal deviation or nasal septal hematoma. No epistaxis.  No foreign bodies. Right sinus exhibits no maxillary sinus tenderness and no frontal sinus tenderness. Left sinus exhibits no maxillary sinus tenderness and no frontal sinus tenderness.  Mouth/Throat: Uvula is midline, oropharynx is clear and moist and mucous membranes are normal. No oropharyngeal exudate.  Eyes: Pupils  are equal, round, and reactive to light. Conjunctivae and EOM are normal. Right eye exhibits no discharge. Left eye exhibits no discharge. No scleral icterus.  Neck: Normal range of motion. Neck supple. No thyromegaly present.  Cardiovascular: Normal rate, regular rhythm and normal heart sounds.   Pulmonary/Chest: Effort normal. No respiratory distress. She has no wheezes. She has no rales.  Rhonchi at bases  Abdominal: Soft. Bowel sounds are normal. She exhibits no distension and no mass. There is no tenderness. There is no rebound and no  guarding. No hernia.  Lymphadenopathy:    She has no cervical adenopathy.  Skin: She is not diaphoretic.  Nursing note and vitals reviewed.    UC Treatments / Results  Labs (all labs ordered are listed, but only abnormal results are displayed) Labs Reviewed  CBC WITH DIFFERENTIAL/PLATELET - Abnormal; Notable for the following:       Result Value   WBC 13.0 (*)    Neutro Abs 10.6 (*)    Lymphs Abs 0.9 (*)    Monocytes Absolute 1.3 (*)    All other components within normal limits  COMPREHENSIVE METABOLIC PANEL - Abnormal; Notable for the following:    Glucose, Bld 103 (*)    Calcium 8.6 (*)    All other components within normal limits  URINALYSIS, COMPLETE (UACMP) WITH MICROSCOPIC - Abnormal; Notable for the following:    Squamous Epithelial / LPF 0-5 (*)    Bacteria, UA FEW (*)    All other components within normal limits  RAPID STREP SCREEN (NOT AT [facility])  CULTURE, GROUP A STREP [facility])  URINE CULTURE    EKG  EKG Interpretation None       Radiology Dg Chest 2 View  Result Date: 11/24/2016 CLINICAL DATA:  Cough and sore throat. Previous right upper lobectomy for carcinoma EXAM: CHEST  2 VIEW COMPARISON:  Chest radiograph April 07, 2016 and chest CT November 15, 2016 FINDINGS: Postoperative changes noted on the right with volume loss and scarring right base region. No edema or consolidation. Heart size and pulmonary vascularity are  normal. No adenopathy. Evidence of old rib trauma on the right, likely of postoperative etiology. IMPRESSION: Postoperative change on the right. No edema or consolidation. Stable cardiac silhouette. No adenopathy evident. Electronically Signed   By: Lowella Grip III M.D.   On: 11/24/2016 09:05    Procedures Procedures (including critical care time)  Medications Ordered in UC Medications - No data to display   Initial Impression / Assessment and Plan / UC Course  I have reviewed the triage vital signs and the nursing notes.  Pertinent labs & imaging results that were available during my care of the patient were reviewed by me and considered in my medical decision making (see chart for details).       Final Clinical Impressions(s) / UC Diagnoses   Final diagnoses:  Viral URI with cough  Urinary incontinence, unspecified type  Dysuria    New Prescriptions Discharge Medication List as of 11/24/2016  9:33 AM      1. Labs/x-ray results and diagnosis reviewed with patient 2. rx as per orders above; reviewed possible side effects, interactions, risks and benefits  3. Recommend supportive treatment with rest, fluids, otc analgesics prn 4. Follow-up prn if symptoms worsen or don't improve  Controlled Substance Prescriptions [place] Controlled Substance Registry consulted? Not Applicable   Norval Gable, MD 11/24/16 [phone]

## 2016-11-24 NOTE — ED Triage Notes (Signed)
Patient reports symptoms of cough, weakness, and confusion 3 days ago. Patient is very confused.

## 2016-11-25 ENCOUNTER — Ambulatory Visit: Payer: Medicare Other | Admitting: Oncology

## 2016-11-25 LAB — URINE CULTURE: Special Requests: NORMAL

## 2016-11-27 LAB — CULTURE, GROUP A STREP (THRC)

## 2016-12-05 ENCOUNTER — Ambulatory Visit (INDEPENDENT_AMBULATORY_CARE_PROVIDER_SITE_OTHER): Payer: Medicare Other

## 2016-12-05 ENCOUNTER — Encounter: Payer: Self-pay | Admitting: Emergency Medicine

## 2016-12-05 ENCOUNTER — Ambulatory Visit
Admission: EM | Admit: 2016-12-05 | Discharge: 2016-12-05 | Disposition: A | Discharge status: Home / Self Care | Payer: Medicare Other | Attending: Family Medicine | Admitting: Family Medicine

## 2016-12-05 DIAGNOSIS — I129 Hypertensive chronic kidney disease with stage 1 through stage 4 chronic kidney disease, or unspecified chronic kidney disease: Secondary | ICD-10-CM | POA: Diagnosis not present

## 2016-12-05 DIAGNOSIS — Z791 Long term (current) use of non-steroidal anti-inflammatories (NSAID): Secondary | ICD-10-CM | POA: Diagnosis not present

## 2016-12-05 DIAGNOSIS — Z90710 Acquired absence of both cervix and uterus: Secondary | ICD-10-CM | POA: Insufficient documentation

## 2016-12-05 DIAGNOSIS — R5383 Other fatigue: Secondary | ICD-10-CM

## 2016-12-05 DIAGNOSIS — Z87891 Personal history of nicotine dependence: Secondary | ICD-10-CM | POA: Insufficient documentation

## 2016-12-05 DIAGNOSIS — R3915 Urgency of urination: Secondary | ICD-10-CM | POA: Diagnosis not present

## 2016-12-05 DIAGNOSIS — F329 Major depressive disorder, single episode, unspecified: Secondary | ICD-10-CM | POA: Diagnosis not present

## 2016-12-05 DIAGNOSIS — K219 Gastro-esophageal reflux disease without esophagitis: Secondary | ICD-10-CM | POA: Insufficient documentation

## 2016-12-05 DIAGNOSIS — D72829 Elevated white blood cell count, unspecified: Secondary | ICD-10-CM | POA: Diagnosis not present

## 2016-12-05 DIAGNOSIS — Z8051 Family history of malignant neoplasm of kidney: Secondary | ICD-10-CM | POA: Diagnosis not present

## 2016-12-05 DIAGNOSIS — R05 Cough: Secondary | ICD-10-CM | POA: Insufficient documentation

## 2016-12-05 DIAGNOSIS — K760 Fatty (change of) liver, not elsewhere classified: Secondary | ICD-10-CM | POA: Insufficient documentation

## 2016-12-05 DIAGNOSIS — Z79899 Other long term (current) drug therapy: Secondary | ICD-10-CM | POA: Diagnosis not present

## 2016-12-05 DIAGNOSIS — Z85118 Personal history of other malignant neoplasm of bronchus and lung: Secondary | ICD-10-CM | POA: Diagnosis not present

## 2016-12-05 DIAGNOSIS — Z888 Allergy status to other drugs, medicaments and biological substances status: Secondary | ICD-10-CM | POA: Insufficient documentation

## 2016-12-05 DIAGNOSIS — N393 Stress incontinence (female) (male): Secondary | ICD-10-CM | POA: Insufficient documentation

## 2016-12-05 DIAGNOSIS — M797 Fibromyalgia: Secondary | ICD-10-CM | POA: Insufficient documentation

## 2016-12-05 DIAGNOSIS — R0981 Nasal congestion: Secondary | ICD-10-CM | POA: Insufficient documentation

## 2016-12-05 DIAGNOSIS — Z9889 Other specified postprocedural states: Secondary | ICD-10-CM | POA: Diagnosis not present

## 2016-12-05 DIAGNOSIS — Z882 Allergy status to sulfonamides status: Secondary | ICD-10-CM | POA: Diagnosis not present

## 2016-12-05 DIAGNOSIS — K589 Irritable bowel syndrome without diarrhea: Secondary | ICD-10-CM | POA: Diagnosis not present

## 2016-12-05 LAB — BASIC METABOLIC PANEL
Anion gap: 9 (ref 5–15)
BUN: 18 mg/dL (ref 6–20)
CO2: 28 mmol/L (ref 22–32)
Calcium: 9 mg/dL (ref 8.9–10.3)
Chloride: 100 mmol/L — ABNORMAL LOW (ref 101–111)
Creatinine, Ser: 0.87 mg/dL (ref 0.44–1.00)
GFR calc Af Amer: >60 mL/min (ref >60)
GFR calc non Af Amer: >60 mL/min (ref >60)
Glucose, Bld: 100 mg/dL — ABNORMAL HIGH (ref 65–99)
Potassium: 3.8 mmol/L (ref 3.5–5.1)
Sodium: 137 mmol/L (ref 135–145)

## 2016-12-05 LAB — CBC WITH DIFFERENTIAL/PLATELET
Basophils Absolute: 0.1 10*3/uL (ref 0–0.1)
Basophils Relative: 1 %
Eosinophils Absolute: 0.2 10*3/uL (ref 0–0.7)
Eosinophils Relative: 1 %
HCT: 40.7 % (ref 35.0–47.0)
Hemoglobin: 13.6 g/dL (ref 12.0–16.0)
Lymphocytes Relative: 9 %
Lymphs Abs: 1.3 10*3/uL (ref 1.0–3.6)
MCH: 30.6 pg (ref 26.0–34.0)
MCHC: 33.4 g/dL (ref 32.0–36.0)
MCV: 91.5 fL (ref 80.0–100.0)
Monocytes Absolute: 0.9 10*3/uL (ref 0.2–0.9)
Monocytes Relative: 6 %
Neutro Abs: 11.4 10*3/uL — ABNORMAL HIGH (ref 1.4–6.5)
Neutrophils Relative %: 83 %
Platelets: 237 10*3/uL (ref 150–440)
RBC: 4.45 MIL/uL (ref 3.80–5.20)
RDW: 12.9 % (ref 11.5–14.5)
WBC: 13.9 10*3/uL — ABNORMAL HIGH (ref 3.6–11.0)

## 2016-12-05 LAB — URINALYSIS, COMPLETE (UACMP) WITH MICROSCOPIC
Bacteria, UA: NONE SEEN
Bilirubin Urine: NEGATIVE
Glucose, UA: NEGATIVE mg/dL
Hgb urine dipstick: NEGATIVE
Ketones, ur: NEGATIVE mg/dL
Leukocytes, UA: NEGATIVE
Nitrite: NEGATIVE
Protein, ur: NEGATIVE mg/dL
Specific Gravity, Urine: 1.015 (ref 1.005–1.030)
pH: 7 (ref 5.0–8.0)

## 2016-12-05 NOTE — Discharge Instructions (Signed)
Rest. Drink plenty of fluids.   Follow up with your primary care physician this week. Follow up is important.  Return to Urgent care for new or worsening concerns.

## 2016-12-05 NOTE — ED Provider Notes (Signed)
[facility] ____________________________________________  Time seen: Approximately 8:35 AM  I have reviewed the triage vital signs and the nursing notes.   HISTORY  Chief Complaint URI  HPI Christy [name] is a 76 y.o. female presenting for evaluation of generalized fatigue that has been present for the last month.  Patient states she has had a few weeks of some runny nose and nasal congestion, but states that is consistent with her chronic allergies and does receive allergy shots.  Patient denies sinus pressure.  States she does have some occasional coughing, but states the cough is not bad.  States she does also have some urinary urgency but denies burning with urination or vaginal complaints.  States the urinary urgency has been present for "sometime".  Patient states that she just feels somewhat give out more than normal.  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  Patient reports that she continues to eat and drink well and continues with normal bowel habits.  Denies any confusion episodes, dizzy, headache, paresthesias, unilateral weakness, syncope or near syncope, chest pain, shortness of breath or abdominal complaints.  Denies any recent insect bite or tick attachment.  Denies any recent fevers.  Denies known trigger for current complaints.  States sleeping well.  Denies increase of stress.  Denies chest pain, palpitations, shortness of breath, abdominal pain, extremity swelling or rash. Denies recent sickness.  Patient was most recently seen in urgent care approximately 2 weeks ago for some similar complaints, states after taking the antibiotic then she continued to have the overall fatigue with possible minimal improvement but no resolution.  Patient states that she has not yet followed up with her doctor for these complaints.   Past Medical History:  Diagnosis Date  . Allergy   . Depression   . Fatty liver  disease, nonalcoholic   . GERD (gastroesophageal reflux disease)   . Hypertension   . Lung cancer ([place]) 02/2014   RUL Lobectomy    Patient Active Problem List   Diagnosis Date Noted  . SUI (stress urinary incontinence, female) 10/15/2014  . Polypharmacy 09/02/2014  . Primary cancer of right upper lobe of lung ([place]) 07/19/2014  . Acid reflux 07/18/2014  . 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 Chronic kidney disease (CKD), stage III (moderate) ([place]) 08/30/2013  . Benign essential HTN 08/30/2013  . Fatty liver disease, nonalcoholic 69/45/0388  . Fibrositis 08/30/2013  . Hot flash, menopausal 08/30/2013  . Billowing mitral valve 08/30/2013  . Allergic rhinitis, seasonal 08/30/2013  . Fibromyalgia 08/30/2013    Past Surgical History:  Procedure Laterality Date  . ABDOMINAL HYSTERECTOMY    . LAPAROSCOPIC HYSTERECTOMY  1977  . LARYNGOSCOPY N/A 07/22/2014   Procedure: JET LARYNGOSCOPY with right vocal cord biopsy;  Surgeon: Margaretha Sheffield, MD;  Location: [facility] ORS;  Service: ENT;  Laterality: N/A;  . LUNG CANCER SURGERY       No current facility-administered medications for this encounter.   Current Outpatient Prescriptions:  .  amoxicillin-clavulanate (AUGMENTIN) 875-125 MG tablet, Take 1 tablet by mouth 2 (two) times daily., Disp: 14 tablet, Rfl: 0 .  benazepril (LOTENSIN) 5 MG tablet, Take 1 tablet by mouth daily., Disp: , Rfl:  .  calcium citrate-vitamin D 500-400 MG-UNIT chewable tablet, Chew 1 tablet by mouth daily., Disp: , Rfl:  .  cetirizine (ZYRTEC) 10 MG tablet, Take by mouth., Disp: , Rfl:  .  Cholecalciferol (D 2000)  2000 UNITS TABS, Take 1 tablet by mouth daily., Disp: , Rfl:  .  EPIPEN 2-PAK 0.3 MG/0.3ML SOAJ injection, , Disp: , Rfl:  .  estradiol (ESTRACE) 0.5 MG tablet, , Disp: , Rfl:  .  ibuprofen (ADVIL,MOTRIN) 600 MG tablet, Take 1 tablet (600 mg total) by mouth every 8 (eight) hours as needed., Disp: 15 tablet, Rfl: 0 .   Multiple Vitamins-Minerals (OCUVITE ADULT 50+ PO), Take 1 tablet by mouth daily., Disp: , Rfl:  .  ondansetron (ZOFRAN ODT) 8 MG disintegrating tablet, Take 1 tablet (8 mg total) by mouth every 8 (eight) hours as needed., Disp: 6 tablet, Rfl: 0 .  Vitamins-Lipotropics (LIPO-FLAVONOID PLUS) TABS, Take 1 tablet by mouth daily., Disp: , Rfl:  .  Azelastine HCl 0.15 % SOLN, Place 1-2 sprays into the nose 2 (two) times daily., Disp: 30 mL, Rfl: 0 .  sodium chloride (OCEAN) 0.65 % SOLN nasal spray, Place 2 sprays into both nostrils every 2 (two) hours while awake., Disp: , Rfl: 0  Allergies Alendronate; Sulfa antibiotics; Ciprofloxacin; and Diphenhydramine  Family History  Problem Relation Age of Onset  . Kidney cancer Cousin   . Cancer Maternal Grandfather   . Cancer Maternal Grandmother   "heart issues"  Social History Social History  Substance Use Topics  . Smoking status: Former Smoker    Packs/day: 0.50    Years: 25.00    Types: Cigarettes    Quit date: 07/18/2006  . Smokeless tobacco: Never Used  . Alcohol use 1.2 oz/week    1 Glasses of wine, 1 Cans of beer per week     Comment: occassionally about 4 times a year    Review of Systems Constitutional: No fever/chills Eyes: No visual changes. ENT: No sore throat. Some nasal congestion. Cardiovascular: Denies chest pain. Respiratory: Denies shortness of breath. Gastrointestinal: No abdominal pain.  No nausea, no vomiting.  No diarrhea.  No constipation. Genitourinary: Positive urgency. No burning, frequency, or pain. Musculoskeletal: Negative for back pain. Skin: Negative for rash. Neurological: Negative for headaches, focal weakness or numbness.   ____________________________________________   PHYSICAL EXAM:  VITAL SIGNS: ED Triage Vitals  Enc Vitals Group     BP 12/05/16 0813 (!) 127/57     Pulse Rate 12/05/16 0813 79     Resp 12/05/16 0813 16     Temp 12/05/16 0813 98.2 F (36.8 C)     Temp Source 12/05/16 0813  Oral     SpO2 12/05/16 0813 98 %     Weight 12/05/16 0815 152 lb (68.9 kg)     Height 12/05/16 0815 5\' 6"  (1.676 m)     Head Circumference --      Peak Flow --      Pain Score --      Pain Loc --      Pain Edu? --      Excl. in [place]? --     Constitutional: Alert and oriented. Well appearing and in no acute distress. Eyes: Conjunctivae are normal.  ENT      Head: Normocephalic and atraumatic.No sinus tenderness.No swelling or erythema.       Ears: Nontender, no erythema, normal TMs bilaterally.       Nose: Minimal nasal congestion noted.       Mouth/Throat: Mucous membranes are moist.Oropharynx non-erythematous.No swelling or exudate.  Neck: No stridor. Supple without meningismus.  Hematological/Lymphatic/Immunilogical: No cervical lymphadenopathy. Cardiovascular: Normal rate, regular rhythm. Grossly normal heart sounds.  Good peripheral circulation. Respiratory: Normal respiratory effort  without tachypnea nor retractions. Breath sounds are clear and equal bilaterally. No wheezes, rales, rhonchi. Gastrointestinal: Soft and nontender. No distention. Normal Bowel sounds. No CVA tenderness. Musculoskeletal:  Steady gait. No midline cervical, thoracic or lumbar tenderness to palpation.       Right lower leg:  No tenderness or edema.      Left lower leg:  No tenderness or edema.  Neurologic:  Normal speech and language. No gross focal neurologic deficits are appreciated. Speech is normal. No gait instability.  Skin:  Skin is warm, dry and intact. No rash noted. Psychiatric: Mood and affect are normal. Speech and behavior are normal. Patient exhibits appropriate insight and judgment   ___________________________________________   LABS (all labs ordered are listed, but only abnormal results are displayed)  Labs Reviewed  CBC WITH DIFFERENTIAL/PLATELET - Abnormal; Notable for the following:       Result Value   WBC 13.9 (*)    Neutro Abs 11.4 (*)    All other components within normal  limits  BASIC METABOLIC PANEL - Abnormal; Notable for the following:    Chloride 100 (*)    Glucose, Bld 100 (*)    All other components within normal limits  URINALYSIS, COMPLETE (UACMP) WITH MICROSCOPIC - Abnormal; Notable for the following:    Squamous Epithelial / LPF 0-5 (*)    All other components within normal limits   ____________________________________________  EKG  ED ECG REPORT I, Marylene Land, the attending provider, personally viewed and interpreted this ECG.   Date: 12/05/2016  EKG Time: 0901  Rate: 80  Rhythm:sinus rhythm with premature supraventricular complexes  Axis: normal  Intervals:none  ST&T Change: none noted  ____________________________________________  RADIOLOGY  Dg Chest 2 View  Result Date: 12/05/2016 CLINICAL DATA:  PT with cough x two weeks. Hx of hypertension, lung cancer on the right side with partial lung removal. EXAM: CHEST  2 VIEW COMPARISON:  Chest x-ray dated 11/24/2016. FINDINGS: Heart size and mediastinal contours are normal. Postoperative changes again noted on the right with associated volume loss and scarring. Lungs otherwise clear. No pleural effusion or pneumothorax seen. Old rib fractures again noted on the right. No acute or suspicious osseous finding. IMPRESSION: No active cardiopulmonary disease. No evidence of pneumonia or pulmonary edema. Electronically Signed   By: Franki Cabot M.D.   On: 12/05/2016 09:25   ____________________________________________   PROCEDURES Procedures    INITIAL IMPRESSION / ASSESSMENT AND PLAN / ED COURSE   Pertinent labs & imaging results that were available during my care of the patient were reviewed by me and considered in my medical decision making (see chart for details).  Very well-appearing patient.  No acute distress.  Patient denies sinus pain or increase of nasal congestion from her baseline. Suspect nasal drainage and postnasal drip related to her chronic allergies. Lungs clear  throughout. Labs reviewed as well as urinalysis and chest x-ray and EKG. Overall unremarkable, however noted to have leukocytosis present.  Patient labs reviewed and frequent elevated white blood cell count was noted in past labs, and discussed with patient concerning possible chronic nature of this however unsure and discussed no clear source of bacterial infection on exam at this time. Discussed in detail with patient need to have very close follow-up with primary care and to have further evaluation of other possible causes of her fatigue. Encourage rest, fluids, supportive care. Discussed monitoring herself closely. Follow-up this week, patient agrees to this.  Discussed follow up with Primary care physician this  week, and discussed importance of this. Discussed follow up and return parameters including no resolution or any worsening concerns. Patient verbalized understanding and agreed to plan.   ____________________________________________   FINAL CLINICAL IMPRESSION(S) / ED DIAGNOSES  Final diagnoses:  Fatigue, unspecified type  Leukocytosis, unspecified type  Nasal congestion     Discharge Medication List as of 12/05/2016  9:38 AM      Note: This dictation was prepared with Dragon dictation along with smaller phrase technology. Any transcriptional errors that result from this process are unintentional.         Marylene Land, NP 12/05/16 1014

## 2016-12-05 NOTE — ED Triage Notes (Signed)
Cough, congested, no energy for over 2 weeks. Seen 2 weeks ago

## 2016-12-16 DIAGNOSIS — M7062 Trochanteric bursitis, left hip: Secondary | ICD-10-CM | POA: Insufficient documentation

## 2016-12-16 DIAGNOSIS — R29898 Other symptoms and signs involving the musculoskeletal system: Secondary | ICD-10-CM | POA: Insufficient documentation

## 2016-12-16 DIAGNOSIS — R531 Weakness: Secondary | ICD-10-CM | POA: Insufficient documentation

## 2016-12-16 DIAGNOSIS — R5383 Other fatigue: Secondary | ICD-10-CM | POA: Insufficient documentation

## 2016-12-16 DIAGNOSIS — M79604 Pain in right leg: Secondary | ICD-10-CM | POA: Insufficient documentation

## 2016-12-16 DIAGNOSIS — M7061 Trochanteric bursitis, right hip: Secondary | ICD-10-CM | POA: Insufficient documentation

## 2016-12-16 DIAGNOSIS — M79605 Pain in left leg: Secondary | ICD-10-CM | POA: Insufficient documentation

## 2017-01-07 ENCOUNTER — Ambulatory Visit
Admission: EM | Admit: 2017-01-07 | Discharge: 2017-01-07 | Disposition: A | Discharge status: Home / Self Care | Payer: Medicare Other | Attending: Family Medicine | Admitting: Family Medicine

## 2017-01-07 ENCOUNTER — Other Ambulatory Visit: Payer: Self-pay

## 2017-01-07 ENCOUNTER — Encounter: Payer: Self-pay | Admitting: Emergency Medicine

## 2017-01-07 DIAGNOSIS — R197 Diarrhea, unspecified: Secondary | ICD-10-CM

## 2017-01-07 DIAGNOSIS — R110 Nausea: Secondary | ICD-10-CM

## 2017-01-07 DIAGNOSIS — E860 Dehydration: Secondary | ICD-10-CM

## 2017-01-07 DIAGNOSIS — R112 Nausea with vomiting, unspecified: Secondary | ICD-10-CM | POA: Diagnosis not present

## 2017-01-07 MED ORDER — ONDANSETRON 8 MG PO TBDP: 8.0000 mg | ORAL_TABLET | Freq: Two times a day (BID) | ORAL | 0 refills | Status: DC

## 2017-01-07 NOTE — ED Provider Notes (Addendum)
[facility]    CSN: [id number] Arrival date & time: 01/07/17  1520     History   Chief Complaint Chief Complaint  Patient presents with  . Nausea    HPI Christy [name] is a 76 y.o. female.   HPI  76 year-old female presents accompanied by her husband complaining that this morning she awoke with nausea vomiting and diarrhea.  States that the diarrhea has been very watery and frequent.  She has had no blood or mucus in the stools.  She was vomiting and having diarrhea at the same time.  There was no blood in the vomitus nor coffee ground appearing materials.  He said they ate at the [employer] hot dogs last night; her husband had the identical same meal but did not become ill.  States she has been trying to drink fluids.  She has not attempted to eat.  Presently she has no fever but she states that her husband took her temperature this afternoon was 105 degrees.  The patient does not appear ill or toxic.  Is in a chest pain or abdominal pain.  Did take Azo last night for a presumed urinary tract infection.         Past Medical History:  Diagnosis Date  . Allergy   . Depression   . Fatty liver disease, nonalcoholic   . GERD (gastroesophageal reflux disease)   . Hypertension   . Lung cancer ([place]) 02/2014   RUL Lobectomy    Patient Active Problem List   Diagnosis Date Noted  . SUI (stress urinary incontinence, female) 10/15/2014  . Polypharmacy 09/02/2014  . Primary cancer of right upper lobe of lung ([place]) 07/19/2014  . Acid reflux 07/18/2014  . 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 Chronic kidney disease (CKD), stage III (moderate) ([place]) 08/30/2013  . Benign essential HTN 08/30/2013  . Fatty liver disease, nonalcoholic 23/76/2831  . Fibrositis 08/30/2013  . Hot flash, menopausal 08/30/2013  . Billowing mitral valve 08/30/2013  . Allergic rhinitis, seasonal 08/30/2013  . Fibromyalgia 08/30/2013    Past Surgical  History:  Procedure Laterality Date  . ABDOMINAL HYSTERECTOMY    . LAPAROSCOPIC HYSTERECTOMY  1977  . LARYNGOSCOPY N/A 07/22/2014   Procedure: JET LARYNGOSCOPY with right vocal cord biopsy;  Surgeon: Margaretha Sheffield, MD;  Location: [facility] ORS;  Service: ENT;  Laterality: N/A;  . LUNG CANCER SURGERY      OB History    No data available       Home Medications    Prior to Admission medications   Medication Sig Start Date End Date Taking? Authorizing Provider  Azelastine HCl 0.15 % SOLN Place 1-2 sprays into the nose 2 (two) times daily. 01/14/16 01/07/17 Yes Betancourt, Aura Fey, NP  benazepril (LOTENSIN) 5 MG tablet Take 1 tablet by mouth daily. 03/04/14  Yes [provider]  calcium citrate-vitamin D 500-400 MG-UNIT chewable tablet Chew 1 tablet by mouth daily.   Yes [provider]  EPIPEN 2-PAK 0.3 MG/0.3ML SOAJ injection  05/17/15  Yes [provider]  estradiol (ESTRACE) 0.5 MG tablet  03/05/15  Yes [provider]  Vitamins-Lipotropics (LIPO-FLAVONOID PLUS) TABS Take 1 tablet by mouth daily.   Yes [provider]  Cholecalciferol (D 2000) 2000 UNITS TABS Take 1 tablet by mouth daily.    [provider]  ibuprofen (ADVIL,MOTRIN) 600 MG tablet Take 1 tablet (600 mg total) by mouth every 8 (eight) hours as needed. 02/02/15   Beather Arbour,  Gretchen Short, MD  Multiple Vitamins-Minerals (OCUVITE ADULT 50+ PO) Take 1 tablet by mouth daily.    [provider]  ondansetron (ZOFRAN ODT) 8 MG disintegrating tablet Take 1 tablet (8 mg total) by mouth 2 (two) times daily. 01/07/17   Lorin Picket, PA-C    Family History Family History  Problem Relation Age of Onset  . Kidney cancer Cousin   . Cancer Maternal Grandfather   . Cancer Maternal Grandmother     Social History Social History   Tobacco Use  . Smoking status: Former Smoker    Packs/day: 0.50    Years: 25.00    Pack years: 12.50    Types: Cigarettes    Last attempt to quit: 07/18/2006     Years since quitting: 10.4  . Smokeless tobacco: Never Used  Substance Use Topics  . Alcohol use: Yes    Alcohol/week: 1.2 oz    Types: 1 Glasses of wine, 1 Cans of beer per week    Comment: occassionally about 4 times a year  . Drug use: No     Allergies   Alendronate; Sulfa antibiotics; Ciprofloxacin; and Diphenhydramine   Review of Systems Review of Systems  Constitutional: Positive for activity change, appetite change, chills, fatigue and fever.  Gastrointestinal: Positive for diarrhea, nausea and vomiting. Negative for abdominal distention, abdominal pain and rectal pain.  All other systems reviewed and are negative.    Physical Exam Triage Vital Signs ED Triage Vitals  Enc Vitals Group     BP 01/07/17 1621 (!) 105/58     Pulse Rate 01/07/17 1621 86     Resp 01/07/17 1621 16     Temp 01/07/17 1621 98.5 F (36.9 C)     Temp Source 01/07/17 1621 Oral     SpO2 01/07/17 1621 100 %     Weight 01/07/17 1621 152 lb (68.9 kg)     Height 01/07/17 1621 5\' 6"  (1.676 m)     Head Circumference --      Peak Flow --      Pain Score 01/07/17 1622 0     Pain Loc --      Pain Edu? --      Excl. in [place]? --    No data found.  Updated Vital Signs BP (!) 105/58 (BP Location: Left Arm)   Pulse 86   Temp 98.5 F (36.9 C) (Oral)   Resp 16   Ht 5\' 6"  (1.676 m)   Wt 152 lb (68.9 kg)   SpO2 99%   BMI 24.53 kg/m   Visual Acuity Right Eye Distance:   Left Eye Distance:   Bilateral Distance:    Right Eye Near:   Left Eye Near:    Bilateral Near:     Physical Exam  Constitutional: She is oriented to person, place, and time. She appears well-developed and well-nourished. No distress.   does not appear ill nor toxic  HENT:  Head: Normocephalic.  Eyes: Pupils are equal, round, and reactive to light.  Neck: Normal range of motion.  Pulmonary/Chest: Effort normal and breath sounds normal.  Abdominal: Soft. Bowel sounds are normal. She exhibits no distension and no mass.  There is no tenderness. There is no rebound and no guarding.  Musculoskeletal: Normal range of motion.  Neurological: She is alert and oriented to person, place, and time.  Skin: Skin is warm and dry. She is not diaphoretic.  He has sluggish skin turgor  Psychiatric: She has a normal mood and  affect. Her behavior is normal. Judgment and thought content normal.  Nursing note and vitals reviewed.    UC Treatments / Results  Labs (all labs ordered are listed, but only abnormal results are displayed) Labs Reviewed - No data to display  EKG  EKG Interpretation None       Radiology No results found.  Procedures Procedures (including critical care time)  Medications Ordered in UC Medications - No data to display   Initial Impression / Assessment and Plan / UC Course  I have reviewed the triage vital signs and the nursing notes.  Pertinent labs & imaging results that were available during my care of the patient were reviewed by me and considered in my medical decision making (see chart for details).     Plan: 1. Test/x-ray results and diagnosis reviewed with patient 2. rx as per orders; risks, benefits, potential side effects reviewed with patient 3. Recommend supportive treatment with oral rehydration.  Provide with Zofran for nausea as necessary.  Use Balmex on rectum after every bowel movement.  If she is not improving or worsens has high fever or abdominal pain develop recommend  immediately going to the emergency room.  At present time I believe the patient can rehydrate orally on her own. 4. F/u prn if symptoms worsen or don't improve   Final Clinical Impressions(s) / UC Diagnoses   Final diagnoses:  Nausea  Diarrhea of presumed infectious origin  Dehydration    ED Discharge Orders        Ordered    ondansetron (ZOFRAN ODT) 8 MG disintegrating tablet  2 times daily     01/07/17 1716       Controlled Substance Prescriptions [place] Controlled Substance Registry  consulted? Not Applicable   Lorin Picket, PA-C 01/07/17 1757    Lorin Picket, PA-C 01/07/17 1758

## 2017-01-07 NOTE — ED Triage Notes (Signed)
Patient states she woke this morning with nausea and vomiting and diarrhea.  Patient states she had a high fever this afternoon.

## 2017-02-16 DIAGNOSIS — M5417 Radiculopathy, lumbosacral region: Secondary | ICD-10-CM | POA: Insufficient documentation

## 2017-03-01 DIAGNOSIS — G629 Polyneuropathy, unspecified: Secondary | ICD-10-CM | POA: Insufficient documentation

## 2017-03-07 ENCOUNTER — Other Ambulatory Visit: Payer: Self-pay | Admitting: Rheumatology

## 2017-03-07 DIAGNOSIS — R94131 Abnormal electromyogram [EMG]: Secondary | ICD-10-CM

## 2017-03-10 ENCOUNTER — Ambulatory Visit
Admission: RE | Admit: 2017-03-10 | Discharge: 2017-03-10 | Disposition: A | Discharge status: Home / Self Care | Payer: Medicare Other | Source: Ambulatory Visit | Attending: Rheumatology | Admitting: Rheumatology

## 2017-03-10 DIAGNOSIS — M4807 Spinal stenosis, lumbosacral region: Secondary | ICD-10-CM | POA: Insufficient documentation

## 2017-03-10 DIAGNOSIS — R94131 Abnormal electromyogram [EMG]: Secondary | ICD-10-CM | POA: Diagnosis not present

## 2017-03-17 DIAGNOSIS — F3341 Major depressive disorder, recurrent, in partial remission: Secondary | ICD-10-CM | POA: Insufficient documentation

## 2017-04-15 ENCOUNTER — Ambulatory Visit (INDEPENDENT_AMBULATORY_CARE_PROVIDER_SITE_OTHER): Payer: Medicare Other | Admitting: Urology

## 2017-04-15 ENCOUNTER — Other Ambulatory Visit
Admission: RE | Admit: 2017-04-15 | Discharge: 2017-04-15 | Disposition: A | Discharge status: Home / Self Care | Payer: Medicare Other | Source: Ambulatory Visit | Attending: Urology | Admitting: Urology

## 2017-04-15 ENCOUNTER — Other Ambulatory Visit: Payer: Self-pay

## 2017-04-15 ENCOUNTER — Encounter: Payer: Self-pay | Admitting: Urology

## 2017-04-15 VITALS — BP 121/62 | HR 67 | Ht 66.0 in | Wt 145.0 lb

## 2017-04-15 DIAGNOSIS — R3129 Other microscopic hematuria: Secondary | ICD-10-CM | POA: Diagnosis not present

## 2017-04-15 DIAGNOSIS — N8111 Cystocele, midline: Secondary | ICD-10-CM

## 2017-04-15 DIAGNOSIS — N390 Urinary tract infection, site not specified: Secondary | ICD-10-CM | POA: Diagnosis present

## 2017-04-15 DIAGNOSIS — N952 Postmenopausal atrophic vaginitis: Secondary | ICD-10-CM | POA: Diagnosis not present

## 2017-04-15 DIAGNOSIS — R350 Frequency of micturition: Secondary | ICD-10-CM

## 2017-04-15 DIAGNOSIS — N3946 Mixed incontinence: Secondary | ICD-10-CM | POA: Diagnosis not present

## 2017-04-15 LAB — URINALYSIS, COMPLETE (UACMP) WITH MICROSCOPIC
Bacteria, UA: NONE SEEN
Bilirubin Urine: NEGATIVE
Glucose, UA: NEGATIVE mg/dL
Hgb urine dipstick: NEGATIVE
Ketones, ur: NEGATIVE mg/dL
Leukocytes, UA: NEGATIVE
Nitrite: NEGATIVE
Protein, ur: NEGATIVE mg/dL
Specific Gravity, Urine: 1.015 (ref 1.005–1.030)
pH: 5.5 (ref 5.0–8.0)

## 2017-04-15 MED ORDER — ESTROGENS, CONJUGATED 0.625 MG/GM VA CREA: 1.0000 | TOPICAL_CREAM | Freq: Every day | VAGINAL | 12 refills | Status: DC

## 2017-04-15 NOTE — Progress Notes (Signed)
04/15/2017 1:31 PM   Kincaid 1940-07-09 [id number]  Referring provider: Ezequiel Kayser, MD [place] [facility] [place], [place] [address]  Chief Complaint  Patient presents with  . New Patient (Initial Visit)    HPI: Patient is a 77 -year-old Caucasian female who is referred to [place] by Dr. Raechel Ache  for urinary incontinence.  Patient states that she has had urinary incontinence for years.  She is having leakage when she laughs, coughs, sneezes etc.  She is also experiencing incontinence when she feels the urge to urinate.  She states she wears 2 poise pads daily.  She has not sought treatment for this condition in the past.  She is not having associated urinary urgency, hesitancy, straining to urinate and weak urinary stream.   Patient denies any gross hematuria, dysuria or suprapubic/flank pain.  Patient denies any fevers, chills, nausea or vomiting.     She did have a recent episode of two UTI's, one in December and one in February.  Both were for E.coli that were pan sensitive.  She attributes these infections to wearing her incontinence pads too long.  She has not had a prior history of UTI's.    She does not have history of nephrolithiasis or GU surgery.    She is not sexually active.  She is post menopausal.  She denies constipation and/or diarrhea.    She is not having pain with bladder filling.     She is drinking 8 ounces of water with breakfast, 4 ounces of water in the afternoon and 4 ounces in the evening.  She has a glass of sweet tea on Wednesday.  She has one cup of coffee in the morning on cold days and one can of Pepsi in the afternoon.  She has a glass of wine most evenings.   She is a former smoker with a history of 1/2 pack daily for 25 years.  She quit 10 years ago.   CATH UA was positive for 0-5 RBC's and 0-5 WBC's.  Her PVR was minimal.     PMH: Past Medical History:  Diagnosis Date  . Allergy   . Anxiety   . Depression   .  Fatty liver disease, nonalcoholic   . GERD (gastroesophageal reflux disease)   . Heart disease   . Hypertension   . Lung cancer ([place]) 02/2014   RUL Lobectomy    Surgical History: Past Surgical History:  Procedure Laterality Date  . ABDOMINAL HYSTERECTOMY    . LAPAROSCOPIC HYSTERECTOMY  1977  . LARYNGOSCOPY N/A 07/22/2014   Procedure: JET LARYNGOSCOPY with right vocal cord biopsy;  Surgeon: Margaretha Sheffield, MD;  Location: [facility] ORS;  Service: ENT;  Laterality: N/A;  . LUNG CANCER SURGERY      Home Medications:  Allergies as of 04/15/2017      Reactions   Alendronate Other (See Comments)   Esophageal burning   Sulfa Antibiotics Other (See Comments)   Ciprofloxacin Other (See Comments)   Other Reaction: GI UPSET   Diphenhydramine Other (See Comments)      Medication List        Accurate as of 04/15/17 11:59 PM. Always use your most recent med list.          Azelastine HCl 0.15 % Soln Place 1-2 sprays into the nose 2 (two) times daily.   benazepril 5 MG tablet Commonly known as:  LOTENSIN Take 1 tablet by mouth daily.   calcium citrate-vitamin D 500-400 MG-UNIT  chewable tablet Chew 1 tablet by mouth daily.   conjugated estrogens vaginal cream Commonly known as:  PREMARIN Place 1 Applicatorful vaginally daily. Apply 0.5mg  (pea-sized amount)  just inside the vaginal introitus with a finger-tip on  Monday, Wednesday and Friday nights.   D 2000 2000 units Tabs Generic drug:  Cholecalciferol Take 1 tablet by mouth daily.   EPIPEN 2-PAK 0.3 mg/0.3 mL Soaj injection Generic drug:  EPINEPHrine   estradiol 0.5 MG tablet Commonly known as:  ESTRACE   gabapentin 100 MG capsule Commonly known as:  NEURONTIN Take by mouth.   ibuprofen 600 MG tablet Commonly known as:  ADVIL,MOTRIN Take 1 tablet (600 mg total) by mouth every 8 (eight) hours as needed.   LIPO-FLAVONOID PLUS Tabs Take 1 tablet by mouth daily.   OCUVITE ADULT 50+ PO Take 1 tablet by mouth daily.     ondansetron 8 MG disintegrating tablet Commonly known as:  ZOFRAN ODT Take 1 tablet (8 mg total) by mouth 2 (two) times daily.       Allergies:  Allergies  Allergen Reactions  . Alendronate Other (See Comments)    Esophageal burning  . Sulfa Antibiotics Other (See Comments)  . Ciprofloxacin Other (See Comments)    Other Reaction: GI UPSET  . Diphenhydramine Other (See Comments)    Family History: Family History  Problem Relation Age of Onset  . Kidney cancer Cousin   . Cancer Maternal Grandfather   . Cancer Maternal Grandmother   . Kidney disease Neg Hx   . Prostate cancer Neg Hx     Social History:  reports that she quit smoking about 10 years ago. Her smoking use included cigarettes. She has a 12.50 pack-year smoking history. She has never used smokeless tobacco. She reports that she drinks about 1.2 oz of alcohol per week. She reports that she does not use drugs.  ROS: UROLOGY Frequent Urination?: No Hard to postpone urination?: Yes Burning/pain with urination?: No Get up at night to urinate?: No Leakage of urine?: Yes Urine stream starts and stops?: No Trouble starting stream?: Yes Do you have to strain to urinate?: Yes Blood in urine?: No Urinary tract infection?: No Sexually transmitted disease?: No Injury to kidneys or bladder?: No Painful intercourse?: No Weak stream?: Yes Currently pregnant?: No Vaginal bleeding?: No Last menstrual period?: n  Gastrointestinal Nausea?: No Vomiting?: No Indigestion/heartburn?: Yes Diarrhea?: No Constipation?: No  Constitutional Fever: No Night sweats?: No Weight loss?: No Fatigue?: Yes  Skin Skin rash/lesions?: No Itching?: Yes  Eyes Blurred vision?: No Double vision?: No  Ears/Nose/Throat Sore throat?: No Sinus problems?: Yes  Hematologic/Lymphatic Swollen glands?: Yes Easy bruising?: Yes  Cardiovascular Leg swelling?: No Chest pain?: No  Respiratory Cough?: Yes Shortness of breath?:  No  Endocrine Excessive thirst?: No  Musculoskeletal Back pain?: Yes Joint pain?: Yes  Neurological Headaches?: No Dizziness?: No  Psychologic Depression?: No Anxiety?: No  Physical Exam: BP 121/62   Pulse 67   Ht 5\' 6"  (1.676 m)   Wt 145 lb (65.8 kg)   BMI 23.40 kg/m   Constitutional: Well nourished. Alert and oriented, No acute distress. HEENT: [place] AT, moist mucus membranes. Trachea midline, no masses. Cardiovascular: No clubbing, cyanosis, or edema. Respiratory: Normal respiratory effort, no increased work of breathing. GI: Abdomen is soft, non tender, non distended, no abdominal masses. Liver and spleen not palpable.  No hernias appreciated.  Stool sample for occult testing is not indicated.   GU: No CVA tenderness.  No bladder fullness or  masses.  Atrophic external genitalia, normal pubic hair distribution, no lesions.  Normal urethral meatus, no lesions, no prolapse, no discharge.   No urethral masses, tenderness and/or tenderness. No bladder fullness, tenderness or masses. Pale vagina mucosa, good estrogen effect, no discharge, no lesions, poor pelvic support, Grade II cystocele is noted.  No is rectocele noted.  Cervix, uterus and adnexa are surgically absent.  Anus and perineum are without rashes or lesions.   Skin: No rashes, bruises or suspicious lesions. Lymph: No cervical or inguinal adenopathy. Neurologic: Grossly intact, no focal deficits, moving all 4 extremities. Psychiatric: Normal mood and affect.  Laboratory Data: Lab Results  Component Value Date   WBC 13.9 (H) 12/05/2016   HGB 13.6 12/05/2016   HCT 40.7 12/05/2016   MCV 91.5 12/05/2016   PLT 237 12/05/2016    Lab Results  Component Value Date   CREATININE 0.87 12/05/2016    No results found for: PSA  No results found for: TESTOSTERONE  No results found for: HGBA1C  No results found for: TSH  No results found for: CHOL, HDL, CHOLHDL, VLDL, LDLCALC  Lab Results  Component Value Date    AST 23 11/24/2016   Lab Results  Component Value Date   ALT 14 11/24/2016   No components found for: ALKALINEPHOPHATASE No components found for: BILIRUBINTOTAL  No results found for: ESTRADIOL  Urinalysis 0-5 RBC's.  0-5 WBC's.  See Epic.   I have reviewed the labs.  Assessment & Plan:    1. Mixed Incontinence  - offered behavioral therapies, bladder training, bladder control strategies and/or pelvic floor muscle training - patient deferred  - fluid management -adequate water intake  - most bothersome is the stress component  - RTC in 3 months for PVR and OAB questionnaire  2. Vaginal atrophy Start vaginal estrogen cream to apply a pea-sized amount with her fingertip just inside the vaginal introitus 3 nights weekly Return in 3 months for exam  3. Microscopic hematuria Patient has had several UAs with 0-5 RBCs per high-power field Our UA results were not available until after the patient left the clinic and it also had 0-5 RBCs per high-power field I explained to the patient that there are a number of causes that can be associated with blood in the urine, such as stones,  UTI's, damage to the urinary tract and/or cancer. At this time, I felt that the patient warranted further urologic evaluation.   The AUA guidelines state that a CT urogram is the preferred imaging study to evaluate hematuria. I explained to the patient that a contrast material will be injected into a vein and that in rare instances, an allergic reaction can result and may even life threatening   The patient denies any allergies to contrast, iodine and/or seafood and is not taking metformin. Her reproductive status is hysterectomy Following the imaging study,  I've recommended a cystoscopy. I described how this is performed, typically in an office setting with a flexible cystoscope. We described the risks, benefits, and possible side effects, the most common of which is a minor amount of blood in the urine and/or  burning which usually resolves in 24 to 48 hours.    - The patient had the opportunity to ask questions which were answered. Based upon this discussion, the patient is willing to proceed. Therefore, I've ordered: a CT Urogram and cystoscopy.  - The patient will return following all of the above for discussion of the results.    4. Cystocele Associated  with stress urinary incontinence, will refer to gynecology for evaluation for pessary placement   Return for CT Urogram report and cystoscopy.  These notes generated with voice recognition software. I apologize for typographical errors.  Zara Council, [facility] [address], [place], [place] [address] [phone]

## 2017-04-18 ENCOUNTER — Telehealth: Payer: Self-pay | Admitting: Obstetrics & Gynecology

## 2017-04-18 NOTE — Telephone Encounter (Signed)
[facility]  Referring for pessary fitting. Called and left message for patient to call back to be schedule

## 2017-04-19 NOTE — Telephone Encounter (Signed)
Unable to leave voicemail due to voicemail box is full.

## 2017-04-22 ENCOUNTER — Encounter: Payer: Self-pay | Admitting: Obstetrics and Gynecology

## 2017-04-22 ENCOUNTER — Ambulatory Visit (INDEPENDENT_AMBULATORY_CARE_PROVIDER_SITE_OTHER): Payer: Medicare Other | Admitting: Obstetrics and Gynecology

## 2017-04-28 ENCOUNTER — Encounter: Payer: Self-pay | Admitting: Obstetrics & Gynecology

## 2017-04-28 ENCOUNTER — Ambulatory Visit: Payer: Medicare Other | Admitting: Obstetrics & Gynecology

## 2017-04-28 VITALS — BP 140/90 | HR 67 | Ht 66.0 in | Wt 161.0 lb

## 2017-04-28 DIAGNOSIS — N8111 Cystocele, midline: Secondary | ICD-10-CM | POA: Diagnosis not present

## 2017-04-28 DIAGNOSIS — N393 Stress incontinence (female) (male): Secondary | ICD-10-CM

## 2017-04-28 NOTE — Patient Instructions (Signed)
Pelvic Organ Prolapse Pelvic organ prolapse is the stretching, bulging, or dropping of pelvic organs into an abnormal position. It happens when the muscles and tissues that surround and support pelvic structures are stretched or weak. Pelvic organ prolapse can involve:  Vagina (vaginal prolapse).  Uterus (uterine prolapse).  Bladder (cystocele).  Rectum (rectocele).  Intestines (enterocele).  When organs other than the vagina are involved, they often bulge into the vagina or protrude from the vagina, depending on how severe the prolapse is. What are the causes? Causes of this condition include:  Pregnancy, labor, and childbirth.  Long-lasting (chronic) cough.  Chronic constipation.  Obesity.  Past pelvic surgery.  Aging. During and after menopause, a decreased production of the hormone estrogen can weaken pelvic ligaments and muscles.  Consistently lifting more than 50 lb (23 kg).  Buildup of fluid in the abdomen due to certain diseases and other conditions.  What are the signs or symptoms? Symptoms of this condition include:  Loss of bladder control when you cough, sneeze, strain, and exercise (stress incontinence). This may be worse immediately following childbirth, and it may gradually improve over time.  Feeling pressure in your pelvis or vagina. This pressure may increase when you cough or when you are having a bowel movement.  A bulge that protrudes from the opening of your vagina or against your vaginal wall. If your uterus protrudes through the opening of your vagina and rubs against your clothing, you may also experience soreness, ulcers, infection, pain, and bleeding.  Increased effort to have a bowel movement or urinate.  Pain in your low back.  Pain, discomfort, or disinterest in sexual intercourse.  Repeated bladder infections (urinary tract infections).  Difficulty inserting or inability to insert a tampon or applicator.  In some people, this  condition does not cause any symptoms. How is this diagnosed? Your health care provider may perform an internal and external vaginal and rectal exam. During the exam, you may be asked to cough and strain while you are lying down, sitting, and standing up. Your health care provider will determine if other tests are required, such as bladder function tests. How is this treated? In most cases, this condition needs to be treated only if it produces symptoms. No treatment is guaranteed to correct the prolapse or relieve the symptoms completely. Treatment may include:  Lifestyle changes, such as: ? Avoiding drinking beverages that contain caffeine. ? Increasing your intake of high-fiber foods. This can help to decrease constipation and straining during bowel movements. ? Emptying your bladder at scheduled times (bladder training therapy). This can help to reduce or avoid urinary incontinence. ? Losing weight if you are overweight or obese.  Estrogen. Estrogen may help mild prolapse by increasing the strength and tone of pelvic floor muscles.  Kegel exercises. These may help mild cases of prolapse by strengthening and tightening the muscles of the pelvic floor.  Pessary insertion. A pessary is a soft, flexible device that is placed into your vagina by your health care provider to help support the vaginal walls and keep pelvic organs in place.  Surgery. This is often the only form of treatment for severe prolapse. Different types of surgeries are available.  Follow these instructions at home:  Wear a sanitary pad or absorbent product if you have urinary incontinence.  Avoid heavy lifting and straining with exercise and work. Do not hold your breath when you perform mild to moderate lifting and exercise activities. Limit your activities as directed by your health care  provider.  Take medicines only as directed by your health care provider.  Perform Kegel exercises as directed by your health care  provider.  If you have a pessary, take care of it as directed by your health care provider. Contact a health care provider if:  Your symptoms interfere with your daily activities or sex life.  You need medicine to help with the discomfort.  You notice bleeding from the vagina that is not related to your period.  You have a fever.  You have pain or bleeding when you urinate.  You have bleeding when you have a bowel movement.  You lose urine when you have sex.  You have chronic constipation.  You have a pessary that falls out.  You have vaginal discharge that has a bad smell.  You have low abdominal pain or cramping that is unusual for you. This information is not intended to replace advice given to you by your health care provider. Make sure you discuss any questions you have with your health care provider. Document Released: 08/22/2013 Document Revised: 07/03/2015 Document Reviewed: 04/09/2013 Elsevier Interactive Patient Education  [employer].

## 2017-04-28 NOTE — Progress Notes (Signed)
Cystocele//Incontinence Patient complains of a urinary incontinence. Problem started a few months ago. Symptoms include: urinary incontinence: severe and discomfort: mild. Symptoms have gradually worsened.  Incontinence is daily, all day, also at night (wakes up w soaked pad), and has min urge, freq.  Leaks w cough,sneeze, laughter.  Min pain.  No recent UTI. Has tried Kegels in past w min improvement.  Prior hyst 30 years ago.  No bleeding or menopause sx's. Prior NSVD 60 years ago.  PMHx: She  has a past medical history of Allergy, Anxiety, Depression, Fatty liver disease, nonalcoholic, GERD (gastroesophageal reflux disease), Heart disease, Hypertension, and Lung cancer ([place]) (02/2014). Also,  has a past surgical history that includes Laryngoscopy (N/A, 07/22/2014); Laparoscopic hysterectomy (1977); Lung cancer surgery; and Abdominal hysterectomy., family history includes Cancer in her maternal grandfather and maternal grandmother; Kidney cancer in her cousin.,  reports that she quit smoking about 10 years ago. Her smoking use included cigarettes. She has a 12.50 pack-year smoking history. She has never used smokeless tobacco. She reports that she drinks about 1.2 oz of alcohol per week. She reports that she does not use drugs.  She has a current medication list which includes the following prescription(s): azelastine hcl, benazepril, calcium citrate-vitamin d, cholecalciferol, conjugated estrogens, epipen 2-pak, estradiol, gabapentin, ibuprofen, multiple vitamins-minerals, ondansetron, and lipo-flavonoid plus. Also, is allergic to alendronate; sulfa antibiotics; ciprofloxacin; and diphenhydramine.  Review of Systems  Constitutional: Negative for chills, fever and malaise/fatigue.  HENT: Negative for congestion, sinus pain and sore throat.   Eyes: Negative for blurred vision and pain.  Respiratory: Negative for cough and wheezing.   Cardiovascular: Negative for chest pain and leg swelling.    Gastrointestinal: Negative for abdominal pain, constipation, diarrhea, heartburn, nausea and vomiting.  Genitourinary: Negative for dysuria, frequency, hematuria and urgency.  Musculoskeletal: Negative for back pain, joint pain, myalgias and neck pain.  Skin: Negative for itching and rash.  Neurological: Negative for dizziness, tremors and weakness.  Endo/Heme/Allergies: Does not bruise/bleed easily.  Psychiatric/Behavioral: Negative for depression. The patient is not nervous/anxious and does not have insomnia.     Objective: BP 140/90   Pulse 67   Wt 161 lb (73 kg)   BMI 25.99 kg/m  Physical Exam  Constitutional: She is oriented to person, place, and time. She appears well-developed and well-nourished. No distress.  Genitourinary: Rectum normal and vagina normal. Pelvic exam was performed with patient supine. There is no rash or lesion on the right labia. There is no rash or lesion on the left labia. Vagina exhibits no lesion. No bleeding in the vagina. Right adnexum does not display mass and does not display tenderness. Left adnexum does not display mass and does not display tenderness.  Genitourinary Comments: Absent Uterus Absent cervix Vaginal cuff well healed q tip test <30 degrees Leakage w cough exemplified Min atrophy Mild Gr 1 cystocele  HENT:  Head: Normocephalic and atraumatic. Head is without laceration.  Right Ear: Hearing normal.  Left Ear: Hearing normal.  Nose: No epistaxis.  No foreign bodies.  Mouth/Throat: Uvula is midline, oropharynx is clear and moist and mucous membranes are normal.  Eyes: Pupils are equal, round, and reactive to light.  Neck: Normal range of motion. Neck supple. No thyromegaly present.  Cardiovascular: Normal rate and regular rhythm. Exam reveals no gallop and no friction rub.  No murmur heard. Pulmonary/Chest: Effort normal and breath sounds normal. No respiratory distress. She has no wheezes. Right breast exhibits no mass, no skin change  and no tenderness.  Left breast exhibits no mass, no skin change and no tenderness.  Abdominal: Soft. Bowel sounds are normal. She exhibits no distension. There is no tenderness. There is no rebound.  Musculoskeletal: Normal range of motion.  Neurological: She is alert and oriented to person, place, and time. No cranial nerve deficit.  Skin: Skin is warm and dry.  Psychiatric: She has a normal mood and affect. Judgment normal.  Vitals reviewed.   ASSESSMENT/PLAN:   Problem List Items Addressed This Visit      Genitourinary   Cystocele, midline - Primary     Other   Stress incontinence of urine    Options of surgery and pessary discussed  Pessary Fitting Patient presents for a pessary fitting. She desires a pessary as her means of controlling her symptoms of prolapse and/or urinary incontinence. She understands the care needed for a pessary and desires to proceed. Alternative treatment options have been discussed at length and the patient voices an understanding of each option.   PROCEDURE: The patient was placed in dorsal lithotomy position. Examination confirmed prolapse. A 3 ring pessary sizer was fitted without difficulty. The patient subsequently ambulated, voided and performed valsalva maneuvers without dislodging the pessary and without discomfort.  This is the removed.  A #3 pessary incontinence ring is ordered. Care instructions were provided. Patient was discharged to home in stable condition. Will call when her pessary arrives.  Barnett Applebaum, MD, [facility], [facility] 04/28/2017  3:28 PM

## 2017-05-05 ENCOUNTER — Telehealth: Payer: Self-pay | Admitting: Urology

## 2017-05-05 NOTE — Telephone Encounter (Signed)
I left word with her husband asking him to have her return my phone call.  Reviewing her chart, she has had several urine's with microscopic blood present.  I would like to move her appointment up from June to sometimes in the next two weeks to discuss this finding further.

## 2017-05-06 NOTE — Telephone Encounter (Signed)
Spoke w/ pt appt moved to 05/13/17.

## 2017-05-12 NOTE — Progress Notes (Signed)
05/13/2017 10:17 AM   Christy [name] Dec 04, 1940 [id number]  Referring provider: Ezequiel Kayser, MD [facility] [place], [place] [address]  Chief Complaint  Patient presents with  . Hematuria    HPI: Patient is a 77 -year-old Caucasian female with microscopic hematuria, urinary incontinence and a cystocele who presents today for a discussion concerning the blood in her urine.    Background history Patient was referred to [place] by Dr. Raechel Ache  for urinary incontinence.  Patient states that she has had urinary incontinence for years.  She is having leakage when she laughs, coughs, sneezes etc.  She is also experiencing incontinence when she feels the urge to urinate.  She states she wears 2 poise pads daily.  She has not sought treatment for this condition in the past.  She is not having associated urinary urgency, hesitancy, straining to urinate and weak urinary stream.   Patient denies any gross hematuria, dysuria or suprapubic/flank pain.  Patient denies any fevers, chills, nausea or vomiting.  She did have a recent episode of two UTI's, one in December and one in February.  Both were for E.coli that were pan sensitive.  She attributes these infections to wearing her incontinence pads too long.  She has not had a prior history of UTI's.  She does not have history of nephrolithiasis or GU surgery.   She is not sexually active.  She is post menopausal.  She denies constipation and/or diarrhea.  She is not having pain with bladder filling.   She is drinking 8 ounces of water with breakfast, 4 ounces of water in the afternoon and 4 ounces in the evening.  She has a glass of sweet tea on Wednesday.  She has one cup of coffee in the morning on cold days and one can of Pepsi in the afternoon.  She has a glass of wine most evenings.  She is a former smoker with a history of 1/2 pack daily for 25 years.  She quit 10 years ago.   CATH UA was positive for 0-5 RBC's and 0-5 WBC's.   Her PVR was minimal.     Reviewing her records, she has had several episodes of 0-3/0-5 RBC's on microscopic exams of urine since 2016.  She states she has orange urine in the mornings.  Patient denies any gross hematuria, dysuria or suprapubic/flank pain.  Patient denies any fevers, chills, nausea or vomiting.   She has not had a formal hematuria work up.    She is using the vaginal cream three nights weekly.  She has been fitted for a pessary and she is waiting for her new pessary.     PMH: Past Medical History:  Diagnosis Date  . Allergy   . Anxiety   . Depression   . Fatty liver disease, nonalcoholic   . GERD (gastroesophageal reflux disease)   . Heart disease   . Hypertension   . Lung cancer ([place]) 02/2014   RUL Lobectomy    Surgical History: Past Surgical History:  Procedure Laterality Date  . ABDOMINAL HYSTERECTOMY    . LAPAROSCOPIC HYSTERECTOMY  1977  . LARYNGOSCOPY N/A 07/22/2014   Procedure: JET LARYNGOSCOPY with right vocal cord biopsy;  Surgeon: Margaretha Sheffield, MD;  Location: [facility] ORS;  Service: ENT;  Laterality: N/A;  . LUNG CANCER SURGERY      Home Medications:  Allergies as of 05/13/2017      Reactions   Alendronate Other (See Comments)   Esophageal  burning   Sulfa Antibiotics Other (See Comments)   Ciprofloxacin Other (See Comments)   Other Reaction: GI UPSET   Diphenhydramine Other (See Comments)      Medication List        Accurate as of 05/13/17 10:17 AM. Always use your most recent med list.          Azelastine HCl 0.15 % Soln Place 1-2 sprays into the nose 2 (two) times daily.   benazepril 5 MG tablet Commonly known as:  LOTENSIN Take 1 tablet by mouth daily.   calcium citrate-vitamin D 500-400 MG-UNIT chewable tablet Chew 1 tablet by mouth daily.   conjugated estrogens vaginal cream Commonly known as:  PREMARIN Place 1 Applicatorful vaginally daily. Apply 0.5mg  (pea-sized amount)  just inside the vaginal introitus with a finger-tip on   Monday, Wednesday and Friday nights.   D 2000 2000 units Tabs Generic drug:  Cholecalciferol Take 1 tablet by mouth daily.   EPIPEN 2-PAK 0.3 mg/0.3 mL Soaj injection Generic drug:  EPINEPHrine   estradiol 1 MG tablet Commonly known as:  ESTRACE   gabapentin 100 MG capsule Commonly known as:  NEURONTIN Take by mouth.   ibuprofen 600 MG tablet Commonly known as:  ADVIL,MOTRIN Take 1 tablet (600 mg total) by mouth every 8 (eight) hours as needed.   LIPO-FLAVONOID PLUS Tabs Take 1 tablet by mouth daily.   OCUVITE ADULT 50+ PO Take 1 tablet by mouth daily.   ondansetron 8 MG disintegrating tablet Commonly known as:  ZOFRAN ODT Take 1 tablet (8 mg total) by mouth 2 (two) times daily.       Allergies:  Allergies  Allergen Reactions  . Alendronate Other (See Comments)    Esophageal burning  . Sulfa Antibiotics Other (See Comments)  . Ciprofloxacin Other (See Comments)    Other Reaction: GI UPSET  . Diphenhydramine Other (See Comments)    Family History: Family History  Problem Relation Age of Onset  . Kidney cancer Cousin   . Cancer Maternal Grandfather   . Cancer Maternal Grandmother   . Kidney disease Neg Hx   . Prostate cancer Neg Hx     Social History:  reports that she quit smoking about 10 years ago. Her smoking use included cigarettes. She has a 12.50 pack-year smoking history. She has never used smokeless tobacco. She reports that she drinks about 1.2 oz of alcohol per week. She reports that she does not use drugs.  ROS: UROLOGY Frequent Urination?: Yes Hard to postpone urination?: Yes Burning/pain with urination?: No Get up at night to urinate?: No Leakage of urine?: Yes Urine stream starts and stops?: No Trouble starting stream?: Yes Do you have to strain to urinate?: Yes Blood in urine?: No Urinary tract infection?: No Sexually transmitted disease?: No Injury to kidneys or bladder?: No Painful intercourse?: No Weak stream?: No Currently  pregnant?: No Vaginal bleeding?: No Last menstrual period?: n  Gastrointestinal Nausea?: No Vomiting?: No Indigestion/heartburn?: Yes Diarrhea?: No Constipation?: No  Constitutional Fever: No Night sweats?: No Weight loss?: No Fatigue?: No  Skin Skin rash/lesions?: No Itching?: No  Eyes Blurred vision?: No Double vision?: No  Ears/Nose/Throat Sore throat?: No Sinus problems?: Yes  Hematologic/Lymphatic Swollen glands?: No Easy bruising?: Yes  Cardiovascular Leg swelling?: No Chest pain?: No  Respiratory Cough?: Yes Shortness of breath?: No  Endocrine Excessive thirst?: No  Musculoskeletal Back pain?: Yes Joint pain?: No  Neurological Headaches?: No Dizziness?: No  Psychologic Depression?: No Anxiety?: No  Physical Exam: BP  121/66   Pulse 78   Ht 5\' 6"  (1.676 m)   Wt 159 lb (72.1 kg)   BMI 25.66 kg/m   Constitutional: Well nourished. Alert and oriented, No acute distress. HEENT: [place] AT, moist mucus membranes. Trachea midline, no masses. Cardiovascular: No clubbing, cyanosis, or edema. Respiratory: Normal respiratory effort, no increased work of breathing. Skin: No rashes, bruises or suspicious lesions. Lymph: No cervical or inguinal adenopathy. Neurologic: Grossly intact, no focal deficits, moving all 4 extremities. Psychiatric: Normal mood and affect.  Laboratory Data: Lab Results  Component Value Date   WBC 13.9 (H) 12/05/2016   HGB 13.6 12/05/2016   HCT 40.7 12/05/2016   MCV 91.5 12/05/2016   PLT 237 12/05/2016    Lab Results  Component Value Date   CREATININE 0.84 05/13/2017    No results found for: PSA  No results found for: TESTOSTERONE  No results found for: HGBA1C  No results found for: TSH  No results found for: CHOL, HDL, CHOLHDL, VLDL, LDLCALC  Lab Results  Component Value Date   AST 23 11/24/2016   Lab Results  Component Value Date   ALT 14 11/24/2016   No components found for: ALKALINEPHOPHATASE No  components found for: BILIRUBINTOTAL  No results found for: ESTRADIOL  Urinalysis Negative.  See Epic.   I have reviewed the labs.  Assessment & Plan:    1. Mixed Incontinence  - offered behavioral therapies, bladder training, bladder control strategies and/or pelvic floor muscle training - patient deferred  - fluid management -adequate water intake  - most bothersome is the stress component  - RTC in 3 months for PVR and OAB questionnaire  2. Vaginal atrophy Start vaginal estrogen cream to apply a pea-sized amount with her fingertip just inside the vaginal introitus 3 nights weekly Return in 3 months for exam  3. Microscopic hematuria Patient has had several UAs with 0-5 RBCs per high-power field Our UA results were not available until after the patient left the clinic and it also had 0-5 RBCs per high-power field I explained to the patient that there are a number of causes that can be associated with blood in the urine, such as stones,  UTI's, damage to the urinary tract and/or cancer. At this time, I felt that the patient warranted further urologic evaluation.   The AUA guidelines state that a CT urogram is the preferred imaging study to evaluate hematuria. I explained to the patient that a contrast material will be injected into a vein and that in rare instances, an allergic reaction can result and may even life threatening   The patient denies any allergies to contrast, iodine and/or seafood and is not taking metformin. Her reproductive status is hysterectomy Following the imaging study,  I've recommended a cystoscopy. I described how this is performed, typically in an office setting with a flexible cystoscope. We described the risks, benefits, and possible side effects, the most common of which is a minor amount of blood in the urine and/or burning which usually resolves in 24 to 48 hours.   The patient had the opportunity to ask questions which were answered. Based upon this  discussion, the patient is willing to proceed. Therefore, I've ordered: a CT Urogram and cystoscopy. The patient will return following all of the above for discussion of the results.    4. Cystocele Associated with stress urinary incontinence Has seen gynecology and has been fitted with a pessary   Return for keep appointments for CT and cysto.  These notes generated with voice recognition software. I apologize for typographical errors.  Zara Council, [facility] [address], [place], [place] [address] [phone]

## 2017-05-13 ENCOUNTER — Ambulatory Visit (INDEPENDENT_AMBULATORY_CARE_PROVIDER_SITE_OTHER): Payer: Medicare Other | Admitting: Urology

## 2017-05-13 ENCOUNTER — Other Ambulatory Visit: Payer: Self-pay

## 2017-05-13 ENCOUNTER — Encounter: Payer: Self-pay | Admitting: Urology

## 2017-05-13 ENCOUNTER — Other Ambulatory Visit
Admission: RE | Admit: 2017-05-13 | Discharge: 2017-05-13 | Disposition: A | Discharge status: Home / Self Care | Payer: Medicare Other | Source: Ambulatory Visit | Attending: Urology | Admitting: Urology

## 2017-05-13 VITALS — BP 121/66 | HR 78 | Ht 66.0 in | Wt 159.0 lb

## 2017-05-13 DIAGNOSIS — N3946 Mixed incontinence: Secondary | ICD-10-CM | POA: Diagnosis not present

## 2017-05-13 DIAGNOSIS — N952 Postmenopausal atrophic vaginitis: Secondary | ICD-10-CM | POA: Diagnosis not present

## 2017-05-13 DIAGNOSIS — R3129 Other microscopic hematuria: Secondary | ICD-10-CM

## 2017-05-13 DIAGNOSIS — N8111 Cystocele, midline: Secondary | ICD-10-CM

## 2017-05-13 LAB — URINALYSIS, COMPLETE (UACMP) WITH MICROSCOPIC
Bacteria, UA: NONE SEEN
Bilirubin Urine: NEGATIVE
Glucose, UA: NEGATIVE mg/dL
Hgb urine dipstick: NEGATIVE
Ketones, ur: NEGATIVE mg/dL
Leukocytes, UA: NEGATIVE
Nitrite: NEGATIVE
Protein, ur: NEGATIVE mg/dL
RBC / HPF: NONE SEEN RBC/hpf (ref 0–5)
Specific Gravity, Urine: 1.01 (ref 1.005–1.030)
pH: 6 (ref 5.0–8.0)

## 2017-05-13 LAB — CREATININE, SERUM
Creatinine, Ser: 0.84 mg/dL (ref 0.44–1.00)
GFR calc Af Amer: >60 mL/min (ref >60)
GFR calc non Af Amer: >60 mL/min (ref >60)

## 2017-05-13 LAB — BUN: BUN: 20 mg/dL (ref 6–20)

## 2017-05-13 NOTE — Patient Instructions (Signed)
Hematuria, Adult Hematuria is blood in your urine. It can be caused by a bladder infection, kidney infection, prostate infection, kidney stone, or cancer of your urinary tract. Infections can usually be treated with medicine, and a kidney stone usually will pass through your urine. If neither of these is the cause of your hematuria, further workup to find out the reason may be needed. It is very important that you tell your health care provider about any blood you see in your urine, even if the blood stops without treatment or happens without causing pain. Blood in your urine that happens and then stops and then happens again can be a symptom of a very serious condition. Also, pain is not a symptom in the initial stages of many urinary cancers. Follow these instructions at home:  Drink lots of fluid, 3-4 quarts a day. If you have been diagnosed with an infection, cranberry juice is especially recommended, in addition to large amounts of water.  Avoid caffeine, tea, and carbonated beverages because they tend to irritate the bladder.  Avoid alcohol because it may irritate the prostate.  Take all medicines as directed by your health care provider.  If you were prescribed an antibiotic medicine, finish it all even if you start to feel better.  If you have been diagnosed with a kidney stone, follow your health care provider's instructions regarding straining your urine to catch the stone.  Empty your bladder often. Avoid holding urine for long periods of time.  After a bowel movement, women should cleanse front to back. Use each tissue only once.  Empty your bladder before and after sexual intercourse if you are a female. Contact a health care provider if:  You develop back pain.  You have a fever.  You have a feeling of sickness in your stomach (nausea) or vomiting.  Your symptoms are not better in 3 days. Return sooner if you are getting worse. Get help right away if:  You develop  severe vomiting and are unable to keep the medicine down.  You develop severe back or abdominal pain despite taking your medicines.  You begin passing a large amount of blood or clots in your urine.  You feel extremely weak or faint, or you pass out. This information is not intended to replace advice given to you by your health care provider. Make sure you discuss any questions you have with your health care provider. Document Released: 01/25/2005 Document Revised: 07/03/2015 Document Reviewed: 09/25/2012 Elsevier Interactive Patient Education  2017 [place] Scan A CT scan (computed tomography scan) is an imaging scan. It uses X-rays and a computer to make detailed pictures of different areas inside the body. A CT scan can give more information than a regular X-ray exam. A CT scan provides data about internal organs, soft tissue structures, blood vessels, and bones. In this procedure, the pictures will be taken in a large machine that has an opening (CT scanner). Tell a health care provider about:  Any allergies you have.  All medicines you are taking, including vitamins, herbs, eye drops, creams, and over-the-counter medicines.  Any blood disorders you have.  Any surgeries you have had.  Any medical conditions you have.  Whether you are pregnant or may be pregnant. What are the risks? Generally, this is a safe procedure. However, problems may occur, including:  An allergic reaction to dyes.  Development of cancer from excessive exposure to radiation from multiple CT scans. This is rare.  What happens before  the procedure? Staying hydrated Follow instructions from your health care provider about hydration, which may include:  Up to 2 hours before the procedure - you may continue to drink clear liquids, such as water, clear fruit juice, black coffee, and plain tea.  Eating and drinking restrictions Follow instructions from your health care provider about eating and  drinking, which may include:  24 hours before the procedure - stop drinking caffeinated beverages, such as energy drinks, tea, soda, coffee, and hot chocolate.  8 hours before the procedure - stop eating heavy meals or foods such as meat, fried foods, or fatty foods.  6 hours before the procedure - stop eating light meals or foods, such as toast or cereal.  6 hours before the procedure - stop drinking milk or drinks that contain milk.  2 hours before the procedure - stop drinking clear liquids.  General instructions  Remove any jewelry.  Ask your health care provider about changing or stopping your regular medicines. This is especially important if you are taking diabetes medicines or blood thinners. What happens during the procedure?  You will lie on a table with your arms above your head.  An IV tube may be inserted into one of your veins.  The contrast dye may be injected into the IV tube. You may feel warm or have a metallic taste in your mouth.  The table you will be lying on will move into the CT scanner.  You will be able to see, hear, and talk to the person running the machine while you are in it. Follow that person's instructions.  The CT scanner will move around you to take pictures. Do not move while it is scanning. Staying still helps the scanner to get a good image.  When the best possible pictures have been taken, the machine will be turned off. The table will be moved out of the machine.  The IV tube will be removed. The procedure may vary among health care providers and hospitals. What happens after the procedure?  It is up to you to get the results of your procedure. Ask your health care provider, or the department that is doing the procedure, when your results will be ready. Summary  A CT scan is an imaging scan.  A CT scan uses X-rays and a computer to make detailed pictures of different areas of your body.  Follow instructions from your health care  provider about eating and drinking before the procedure.  You will be able to see, hear, and talk to the person running the machine while you are in it. Follow that person's instructions. This information is not intended to replace advice given to you by your health care provider. Make sure you discuss any questions you have with your health care provider. Document Released: 03/04/2004 Document Revised: 02/28/2016 Document Reviewed: 02/28/2016 Elsevier Interactive Patient Education  2018 [employer].  Cystoscopy Cystoscopy is a procedure that is used to help diagnose and sometimes treat conditions that affect that lower urinary tract. The lower urinary tract includes the bladder and the tube that drains urine from the bladder out of the body (urethra). Cystoscopy is performed with a thin, tube-shaped instrument with a light and camera at the end (cystoscope). The cystoscope may be hard (rigid) or flexible, depending on the goal of the procedure.The cystoscope is inserted through the urethra, into the bladder. Cystoscopy may be recommended if you have:  Urinary tractinfections that keep coming back (recurring).  Blood in the urine (hematuria).  Loss of bladder control (urinary incontinence) or an overactive bladder.  Unusual cells found in a urine sample.  A blockage in the urethra.  Painful urination.  An abnormality in the bladder found during an intravenous pyelogram (IVP) or CT scan.  Cystoscopy may also be done to remove a sample of tissue to be examined under a microscope (biopsy). Tell a health care provider about:  Any allergies you have.  All medicines you are taking, including vitamins, herbs, eye drops, creams, and over-the-counter medicines.  Any problems you or family members have had with anesthetic medicines.  Any blood disorders you have.  Any surgeries you have had.  Any medical conditions you have.  Whether you are pregnant or may be pregnant. What are the  risks? Generally, this is a safe procedure. However, problems may occur, including:  Infection.  Bleeding.  Allergic reactions to medicines.  Damage to other structures or organs.  What happens before the procedure?  Ask your health care provider about: ? Changing or stopping your regular medicines. This is especially important if you are taking diabetes medicines or blood thinners. ? Taking medicines such as aspirin and ibuprofen. These medicines can thin your blood. Do not take these medicines before your procedure if your health care provider instructs you not to.  Follow instructions from your health care provider about eating or drinking restrictions.  You may be given antibiotic medicine to help prevent infection.  You may have an exam or testing, such as X-rays of the bladder, urethra, or kidneys.  You may have urine tests to check for signs of infection.  Plan to have someone take you home after the procedure. What happens during the procedure?  To reduce your risk of infection,your health care team will wash or sanitize their hands.  You will be given one or more of the following: ? A medicine to help you relax (sedative). ? A medicine to numb the area (local anesthetic).  The area around the opening of your urethra will be cleaned.  The cystoscope will be passed through your urethra into your bladder.  Germ-free (sterile)fluid will flow through the cystoscope to fill your bladder. The fluid will stretch your bladder so that your surgeon can clearly examine your bladder walls.  The cystoscope will be removed and your bladder will be emptied. The procedure may vary among health care providers and hospitals. What happens after the procedure?  You may have some soreness or pain in your abdomen and urethra. Medicines will be available to help you.  You may have some blood in your urine.  Do not drive for 24 hours if you received a sedative. This information is  not intended to replace advice given to you by your health care provider. Make sure you discuss any questions you have with your health care provider. Document Released: 01/23/2000 Document Revised: 06/05/2015 Document Reviewed: 12/12/2014 Elsevier Interactive Patient Education  [employer].

## 2017-05-14 LAB — URINE CULTURE: Culture: NO GROWTH

## 2017-05-15 NOTE — Progress Notes (Signed)
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  INTERVAL HISTORY: Patient returns to clinic today for routine 65-month evaluation and discussion of her imaging results.  She continues to feel well and remains asymptomatic.  She denies any recent fevers or illnesses. She has no neurologic complaints. She has a good appetite and denies weight loss. She has no chest pain, shortness of breath, cough, or hemoptysis. She denies any nausea, vomiting, constipation, or diarrhea. She has no urinary complaints. Patient offers no specific complaints today.  REVIEW OF SYSTEMS:   Review of Systems  Constitutional: Negative.  Negative for fever, malaise/fatigue and weight loss.  Respiratory: Negative.  Negative for cough, hemoptysis and shortness of breath.   Cardiovascular: Negative.  Negative for chest pain and leg swelling.  Gastrointestinal: Negative.  Negative for abdominal pain.  Genitourinary: Negative.  Negative for dysuria.  Musculoskeletal: Negative.  Negative for back pain.  Skin: Negative.  Negative for rash.  Neurological: Negative.  Negative for sensory change, focal weakness and weakness.  Psychiatric/Behavioral: Negative.  The patient is not nervous/anxious.     As per HPI. Otherwise, a complete review of systems is negative.  PAST MEDICAL HISTORY: Past Medical History:  Diagnosis Date  . Allergy   . Anxiety   . Depression   . Fatty liver disease, nonalcoholic   . GERD (gastroesophageal reflux disease)   . Heart disease   . Hypertension   . Lung cancer ([place]) 02/2014   RUL Lobectomy    PAST SURGICAL HISTORY: Past Surgical History:  Procedure Laterality Date  . ABDOMINAL HYSTERECTOMY    . LAPAROSCOPIC HYSTERECTOMY  1977  .  LARYNGOSCOPY N/A 07/22/2014   Procedure: JET LARYNGOSCOPY with right vocal cord biopsy;  Surgeon: Margaretha Sheffield, MD;  Location: [facility] ORS;  Service: ENT;  Laterality: N/A;  . LUNG CANCER SURGERY      FAMILY HISTORY Family History  Problem Relation Age of Onset  . Kidney cancer Cousin   . Cancer Maternal Grandfather   . Cancer Maternal Grandmother   . Kidney disease Neg Hx   . Prostate cancer Neg Hx        ADVANCED DIRECTIVES:    HEALTH MAINTENANCE: Social History   Tobacco Use  . Smoking status: Former Smoker    Packs/day: 0.50    Years: 25.00    Pack years: 12.50    Types: Cigarettes    Last attempt to quit: 07/18/2006    Years since quitting: 10.8  . Smokeless tobacco: Never Used  Substance Use Topics  . Alcohol use: Yes    Alcohol/week: 1.2 oz    Types: 1 Glasses of wine, 1 Cans of beer per week    Comment: occassionally about 4 times a year  . Drug use: No     Allergies  Allergen Reactions  . Alendronate Other (See Comments)    Esophageal burning  . Sulfa Antibiotics Other (See Comments)  . Ciprofloxacin Other (See Comments)    Other Reaction: GI UPSET  . Diphenhydramine Other (See Comments)    Current Outpatient Medications  Medication Sig Dispense Refill  . benazepril (LOTENSIN) 5 MG tablet Take 1 tablet by mouth daily.    . calcium citrate-vitamin D 500-400 MG-UNIT chewable tablet Chew 1 tablet by mouth daily.    . Cholecalciferol (  D 2000) 2000 UNITS TABS Take 1 tablet by mouth daily.    Marland Kitchen conjugated estrogens (PREMARIN) vaginal cream Place 1 Applicatorful vaginally daily. Apply 0.5mg  (pea-sized amount)  just inside the vaginal introitus with a finger-tip on  Monday, Wednesday and Friday nights. 30 g 12  . estradiol (ESTRACE) 1 MG tablet     . gabapentin (NEURONTIN) 100 MG capsule Take by mouth 3 (three) times daily.     . Multiple Vitamins-Minerals (OCUVITE ADULT 50+ PO) Take 1 tablet by mouth daily.    . Azelastine HCl 0.15 % SOLN Place 1-2 sprays into  the nose 2 (two) times daily. (Patient not taking: Reported on 05/18/2017) 30 mL 0  . EPIPEN 2-PAK 0.3 MG/0.3ML SOAJ injection     . ibuprofen (ADVIL,MOTRIN) 600 MG tablet Take 1 tablet (600 mg total) by mouth every 8 (eight) hours as needed. (Patient not taking: Reported on 05/18/2017) 15 tablet 0  . ondansetron (ZOFRAN ODT) 8 MG disintegrating tablet Take 1 tablet (8 mg total) by mouth 2 (two) times daily. (Patient not taking: Reported on 05/18/2017) 6 tablet 0   No current facility-administered medications for this visit.     OBJECTIVE: Vitals:   05/18/17 1410  BP: 109/67  Pulse: 95  Resp: 18  Temp: (!) 97.3 F (36.3 C)     Body mass index is 25.52 kg/m.    ECOG FS:0 - Asymptomatic  General: Well-developed, well-nourished, no acute distress. Eyes: Pink conjunctiva, anicteric sclera. Lungs: Clear to auscultation bilaterally. Heart: Regular rate and rhythm. No rubs, murmurs, or gallops. Abdomen: Soft, nontender, nondistended. No organomegaly noted, normoactive bowel sounds. Musculoskeletal: No edema, cyanosis, or clubbing. Neuro: Alert, answering all questions appropriately. Cranial nerves grossly intact. Skin: No rashes or petechiae noted. Psych: Normal affect.  LAB RESULTS:  Lab Results  Component Value Date   NA 137 12/05/2016   K 3.8 12/05/2016   CL 100 (L) 12/05/2016   CO2 28 12/05/2016   GLUCOSE 100 (H) 12/05/2016   BUN 20 05/13/2017   CREATININE 0.84 05/13/2017   CALCIUM 9.0 12/05/2016   PROT 7.7 11/24/2016   ALBUMIN 3.6 11/24/2016   AST 23 11/24/2016   ALT 14 11/24/2016   ALKPHOS 71 11/24/2016   BILITOT 0.6 11/24/2016   GFRNONAA >60 05/13/2017   GFRAA >60 05/13/2017    Lab Results  Component Value Date   WBC 13.9 (H) 12/05/2016   NEUTROABS 11.4 (H) 12/05/2016   HGB 13.6 12/05/2016   HCT 40.7 12/05/2016   MCV 91.5 12/05/2016   PLT 237 12/05/2016     STUDIES: Ct Chest W Contrast  Result Date: 05/16/2017 CLINICAL DATA:  Follow-up lung carcinoma.  Persistent microscopic hematuria. EXAM: CT CHEST WITH CONTRAST CT ABDOMEN AND PELVIS WITH AND WITHOUT CONTRAST TECHNIQUE: Multidetector CT imaging of the chest was performed during intravenous contrast administration. Multidetector CT imaging of the abdomen and pelvis was performed following the standard protocol before and during bolus administration of intravenous contrast. CONTRAST:  157mL ISOVUE-300 IOPAMIDOL (ISOVUE-300) INJECTION 61% COMPARISON:  Chest CT on 11/15/2016, and AP CT on 03/31/2012 FINDINGS: CT CHEST FINDINGS Cardiovascular: No acute findings. Aortic and coronary artery atherosclerosis. Mediastinum/Lymph Nodes: Stable postop changes from previous right upper lobectomy. New ill-defined nodular opacity is seen in the anterolateral right lower lobe on image 104/3, measuring 2.3 x 1.0 cm. Multiple other scattered sub-cm pulmonary nodules in both lungs remain stable, consistent with benign etiology. No evidence of pleural effusion. Lungs/Pleura: No pulmonary infiltrate or mass identified. No effusion present. Musculoskeletal:  No suspicious bone lesions identified. CT ABDOMEN AND PELVIS FINDINGS Hepatobiliary: No masses identified. Gallbladder is unremarkable. Pancreas:  No mass or inflammatory changes. Spleen:  Within normal limits in size and appearance. Adrenals/Urinary tract: No adrenal masses identified. No evidence of urolithiasis or hydronephrosis. Tiny sub-cm right lower pole renal cyst is stable. No complex cystic or solid renal masses identified. No masses seen involving the ureters or bladder. Stomach/Bowel: No evidence of obstruction, inflammatory process, or abnormal fluid collections. Vascular/Lymphatic: No pathologically enlarged lymph nodes identified. No abdominal aortic aneurysm. Aortic atherosclerosis. Reproductive: Prior hysterectomy noted. Stable small simple appearing cyst in the left adnexa measuring approximately 1.8 cm, consistent with benign etiology. Other:  None.  Musculoskeletal:  No suspicious bone lesions identified. IMPRESSION: New ill-defined right lower lobe nodular opacity, with differential diagnosis including infectious or inflammatory etiologies and bronchogenic carcinoma. Consider further evaluation with PET-CT scan, or close follow-up by chest CT in 3 months. No evidence of urinary tract neoplasm, urolithiasis, or hydronephrosis. Electronically Signed   By: Earle Gell M.D.   On: 05/16/2017 13:52   Ct Hematuria Workup  Result Date: 05/16/2017 CLINICAL DATA:  Follow-up lung carcinoma. Persistent microscopic hematuria. EXAM: CT CHEST WITH CONTRAST CT ABDOMEN AND PELVIS WITH AND WITHOUT CONTRAST TECHNIQUE: Multidetector CT imaging of the chest was performed during intravenous contrast administration. Multidetector CT imaging of the abdomen and pelvis was performed following the standard protocol before and during bolus administration of intravenous contrast. CONTRAST:  141mL ISOVUE-300 IOPAMIDOL (ISOVUE-300) INJECTION 61% COMPARISON:  Chest CT on 11/15/2016, and AP CT on 03/31/2012 FINDINGS: CT CHEST FINDINGS Cardiovascular: No acute findings. Aortic and coronary artery atherosclerosis. Mediastinum/Lymph Nodes: Stable postop changes from previous right upper lobectomy. New ill-defined nodular opacity is seen in the anterolateral right lower lobe on image 104/3, measuring 2.3 x 1.0 cm. Multiple other scattered sub-cm pulmonary nodules in both lungs remain stable, consistent with benign etiology. No evidence of pleural effusion. Lungs/Pleura: No pulmonary infiltrate or mass identified. No effusion present. Musculoskeletal:  No suspicious bone lesions identified. CT ABDOMEN AND PELVIS FINDINGS Hepatobiliary: No masses identified. Gallbladder is unremarkable. Pancreas:  No mass or inflammatory changes. Spleen:  Within normal limits in size and appearance. Adrenals/Urinary tract: No adrenal masses identified. No evidence of urolithiasis or hydronephrosis. Tiny sub-cm  right lower pole renal cyst is stable. No complex cystic or solid renal masses identified. No masses seen involving the ureters or bladder. Stomach/Bowel: No evidence of obstruction, inflammatory process, or abnormal fluid collections. Vascular/Lymphatic: No pathologically enlarged lymph nodes identified. No abdominal aortic aneurysm. Aortic atherosclerosis. Reproductive: Prior hysterectomy noted. Stable small simple appearing cyst in the left adnexa measuring approximately 1.8 cm, consistent with benign etiology. Other:  None. Musculoskeletal:  No suspicious bone lesions identified. IMPRESSION: New ill-defined right lower lobe nodular opacity, with differential diagnosis including infectious or inflammatory etiologies and bronchogenic carcinoma. Consider further evaluation with PET-CT scan, or close follow-up by chest CT in 3 months. No evidence of urinary tract neoplasm, urolithiasis, or hydronephrosis. Electronically Signed   By: Earle Gell M.D.   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  PLAN:     1. 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 Pathology and surgical report reviewed independently.  CT scan results from May 16, 2017 reviewed independently and reported as above with new ill-defined right lower lobe opacity.  Case was discussed at case conference, and it  was agreed to pursue short-term CT follow-up in 3 months.  Return to clinic several days after to discuss the results.  If CT is unchanged or normal, can consider imaging once per year for a total of 5 years at that point.   2. Hypertension: Patient's blood pressure is within normal limits today. Monitor.  Approximately 20 minutes spent in discussion of which greater than 50% was consultation.  Patient expressed understanding and was in agreement with this plan. She also understands that She can call clinic at any time  with any questions, concerns, or complaints.    Lloyd Huger, MD   05/21/2017 8:07 AM

## 2017-05-16 ENCOUNTER — Ambulatory Visit
Admission: RE | Admit: 2017-05-16 | Discharge: 2017-05-16 | Disposition: A | Discharge status: Home / Self Care | Payer: Medicare Other | Source: Ambulatory Visit | Attending: Oncology | Admitting: Oncology

## 2017-05-16 DIAGNOSIS — C3411 Malignant neoplasm of upper lobe, right bronchus or lung: Secondary | ICD-10-CM | POA: Insufficient documentation

## 2017-05-16 DIAGNOSIS — R3129 Other microscopic hematuria: Secondary | ICD-10-CM

## 2017-05-16 MED ORDER — IOPAMIDOL (ISOVUE-300) INJECTION 61%
125.0000 mL | Freq: Once | INTRAVENOUS | Status: AC | PRN
  Administered 2017-05-16: 125 mL via INTRAVENOUS

## 2017-05-18 ENCOUNTER — Encounter: Payer: Self-pay | Admitting: Oncology

## 2017-05-18 ENCOUNTER — Inpatient Hospital Stay: Payer: Medicare Other | Attending: Oncology | Admitting: Oncology

## 2017-05-18 ENCOUNTER — Other Ambulatory Visit: Payer: Self-pay

## 2017-05-18 VITALS — BP 109/67 | HR 95 | Temp 97.3°F | Resp 18 | Wt 158.1 lb

## 2017-05-18 DIAGNOSIS — I10 Essential (primary) hypertension: Secondary | ICD-10-CM | POA: Insufficient documentation

## 2017-05-18 DIAGNOSIS — C3411 Malignant neoplasm of upper lobe, right bronchus or lung: Secondary | ICD-10-CM | POA: Insufficient documentation

## 2017-05-18 NOTE — Progress Notes (Signed)
Here for follow up.per pt overall doing well.

## 2017-05-26 ENCOUNTER — Inpatient Hospital Stay: Payer: Medicare Other

## 2017-05-30 ENCOUNTER — Telehealth: Payer: Self-pay | Admitting: Family Medicine

## 2017-05-30 NOTE — Telephone Encounter (Signed)
Patient notified

## 2017-05-30 NOTE — Telephone Encounter (Signed)
-----   Message from Nori Riis, PA-C sent at 05/27/2017  4:23 PM EDT ----- Mrs. Christy [name] came by the [facility] office on 05/20/2017 wanting to know the results of her CT scan.  I did not know at that time if she was aware of the of the new finding in her right lung at that time and felt she needed to follow up with oncology regarding that finding.  She has follow up with oncology and they have discussed this and they are going to repeat the CT scan of her chest in three months. Her appointment for her cystoscopy isn't until the 10th of May.  We can tell her that the preliminary results are good, but that she needs to complete the cystoscopy for the final results.

## 2017-06-16 ENCOUNTER — Other Ambulatory Visit: Payer: Self-pay

## 2017-06-16 DIAGNOSIS — R3129 Other microscopic hematuria: Secondary | ICD-10-CM

## 2017-06-17 ENCOUNTER — Other Ambulatory Visit
Admission: RE | Admit: 2017-06-17 | Discharge: 2017-06-17 | Disposition: A | Discharge status: Home / Self Care | Payer: Medicare Other | Source: Ambulatory Visit | Attending: Urology | Admitting: Urology

## 2017-06-17 ENCOUNTER — Ambulatory Visit (INDEPENDENT_AMBULATORY_CARE_PROVIDER_SITE_OTHER): Payer: Medicare Other | Admitting: Urology

## 2017-06-17 ENCOUNTER — Encounter: Payer: Self-pay | Admitting: Urology

## 2017-06-17 VITALS — BP 126/63 | HR 66 | Ht 66.0 in | Wt 158.0 lb

## 2017-06-17 DIAGNOSIS — R3129 Other microscopic hematuria: Secondary | ICD-10-CM

## 2017-06-17 LAB — URINALYSIS, COMPLETE (UACMP) WITH MICROSCOPIC
Bilirubin Urine: NEGATIVE
Glucose, UA: NEGATIVE mg/dL
Hgb urine dipstick: NEGATIVE
Ketones, ur: NEGATIVE mg/dL
Leukocytes, UA: NEGATIVE
Nitrite: NEGATIVE
Protein, ur: NEGATIVE mg/dL
Specific Gravity, Urine: 1.01 (ref 1.005–1.030)
pH: 7 (ref 5.0–8.0)

## 2017-06-17 MED ORDER — CIPROFLOXACIN HCL 500 MG PO TABS
500.0000 mg | ORAL_TABLET | Freq: Once | ORAL | Status: AC
  Administered 2017-06-17: 500 mg via ORAL

## 2017-06-17 MED ORDER — LIDOCAINE HCL URETHRAL/MUCOSAL 2 % EX GEL
1.0000 "application " | Freq: Once | CUTANEOUS | Status: AC
  Administered 2017-06-17: 1 via URETHRAL

## 2017-06-17 NOTE — Progress Notes (Signed)
   06/17/17  CC: No chief complaint on file.   HPI: 77 year old female with microscopic hematuria who presents today for cystoscopy for completion of her work-up.  She underwent CT urogram on 05/16/2017 which showed a new ill-defined right lower lobe opacity and is being followed now by Dr. Grayland Ormond for this with plan CT scan in 08/2017 for short-term follow-up.  No evidence of any GU pathology on CT urogram.  She denies any signs or symptoms of UTI including no dysuria or gross hematuria.  Vitals:   06/17/17 1415  Weight: 158 lb (71.7 kg)  Height: 5\' 6"  (1.676 m)   NED. A&Ox3.   No respiratory distress   Abd soft, NT, ND Normal external genitalia with patent urethral meatus  Cystoscopy Procedure Note  Patient identification was confirmed, informed consent was obtained, and patient was prepped using Betadine solution.  Lidocaine jelly was administered per urethral meatus.    Preoperative abx where received prior to procedure.    Procedure: - Flexible cystoscope introduced, without any difficulty.   - Thorough search of the bladder revealed:    normal urethral meatus    normal urothelium    no stones    no ulcers     no tumors    no urethral polyps    no trabeculation  - Ureteral orifices were normal in position and appearance.  Post-Procedure: - Patient tolerated the procedure well  Assessment/ Plan:  1. Microscopic hematuria Cystoscopy today unremarkable CT urogram personally reviewed, no GU pathology identified Follow-up as needed - ciprofloxacin (CIPRO) tablet 500 mg - lidocaine (XYLOCAINE) 2 % jelly 1 application   F/u prn  Hollice Espy, MD

## 2017-07-15 ENCOUNTER — Ambulatory Visit: Payer: Medicare Other | Admitting: Urology

## 2017-07-27 ENCOUNTER — Telehealth: Payer: Self-pay

## 2017-07-27 NOTE — Telephone Encounter (Signed)
Pt inquiring if pessary has arrived. Cb#[phone]. Ok to leave vm.

## 2017-07-28 NOTE — Telephone Encounter (Signed)
Spoke w/pt. Notified [facility] requested 2 pessaries & they were on back order. One arrived a few weeks ago. The other just arrived. Pt transferred to [facility] for apt scheduling.

## 2017-07-29 ENCOUNTER — Encounter: Payer: Self-pay | Admitting: Obstetrics & Gynecology

## 2017-07-29 ENCOUNTER — Ambulatory Visit (INDEPENDENT_AMBULATORY_CARE_PROVIDER_SITE_OTHER): Payer: Medicare Other | Admitting: Obstetrics & Gynecology

## 2017-07-29 VITALS — BP 130/70 | Ht 65.5 in | Wt 157.0 lb

## 2017-07-29 DIAGNOSIS — N393 Stress incontinence (female) (male): Secondary | ICD-10-CM

## 2017-07-29 DIAGNOSIS — N8111 Cystocele, midline: Secondary | ICD-10-CM

## 2017-07-29 NOTE — Progress Notes (Signed)
  HPI:      [name] is a 77 y.o.  who presents today for her pessary placement after successful fitting last visit, and pessaries ordered have arrived.  This is related to her pelvic floor weakening and sx's of pressure and incontinence.   PMHx: She  has a past medical history of Allergy, Anxiety, Depression, Fatty liver disease, nonalcoholic, GERD (gastroesophageal reflux disease), Heart disease, Hypertension, and Lung cancer ([place]) (02/2014). Also,  has a past surgical history that includes Laryngoscopy (N/A, 07/22/2014); Laparoscopic hysterectomy (1977); Lung cancer surgery; and Abdominal hysterectomy., family history includes Cancer in her maternal grandfather and maternal grandmother; Kidney cancer in her cousin.,  reports that she quit smoking about 11 years ago. Her smoking use included cigarettes. She has a 12.50 pack-year smoking history. She has never used smokeless tobacco. She reports that she drinks about 1.2 oz of alcohol per week. She reports that she does not use drugs.  She has a current medication list which includes the following prescription(s): azelastine hcl, benazepril, calcium citrate-vitamin d, cholecalciferol, conjugated estrogens, epipen 2-pak, estradiol, gabapentin, ibuprofen, and multiple vitamins-minerals. Also, is allergic to alendronate; sulfa antibiotics; ciprofloxacin; and diphenhydramine.  Review of Systems  All other systems reviewed and are negative.  Objective: There were no vitals taken for this visit. Physical Exam  Constitutional: She is oriented to person, place, and time. She appears well-developed and well-nourished. No distress.  Genitourinary: Vagina normal. Pelvic exam was performed with patient supine. There is no rash, tenderness or lesion on the right labia. There is no rash, tenderness or lesion on the left labia. No erythema or bleeding in the vagina.  Genitourinary Comments: Absent Uterus and Absent cervix Vaginal cuff well  healed Min atrophy Mild Gr 1 cystocele   HENT:  Head: Normocephalic and atraumatic.  Nose: Nose normal.  Mouth/Throat: Oropharynx is clear and moist.  Abdominal: Soft. She exhibits no distension. There is no tenderness.  Musculoskeletal: Normal range of motion.  Neurological: She is alert and oriented to person, place, and time. No cranial nerve deficit.  Skin: Skin is warm and dry.  Psychiatric: She has a normal mood and affect.    Pessary Care Pessary placed today.  Appropriate size and fit.  A/P: Pessary was placed today.  Incontinence Ring #4. Instructions given for care. Concerning symptoms to observe for are counseled to patient. Follow up scheduled for 1 months.  A total of 15 minutes were spent face-to-face with the patient during this encounter and over half of that time dealt with counseling and coordination of care.  Barnett Applebaum, MD, [facility], [facility] 07/29/2017  11:08 AM

## 2017-08-17 ENCOUNTER — Ambulatory Visit
Admission: RE | Admit: 2017-08-17 | Discharge: 2017-08-17 | Disposition: A | Discharge status: Home / Self Care | Payer: Medicare Other | Source: Ambulatory Visit | Attending: Oncology | Admitting: Oncology

## 2017-08-17 DIAGNOSIS — Z902 Acquired absence of lung [part of]: Secondary | ICD-10-CM | POA: Diagnosis not present

## 2017-08-17 DIAGNOSIS — I700 Atherosclerosis of aorta: Secondary | ICD-10-CM | POA: Insufficient documentation

## 2017-08-17 DIAGNOSIS — R918 Other nonspecific abnormal finding of lung field: Secondary | ICD-10-CM | POA: Diagnosis not present

## 2017-08-17 DIAGNOSIS — C3411 Malignant neoplasm of upper lobe, right bronchus or lung: Secondary | ICD-10-CM

## 2017-08-17 LAB — POCT I-STAT CREATININE: Creatinine, Ser: 0.9 mg/dL (ref 0.44–1.00)

## 2017-08-17 MED ORDER — IOPAMIDOL (ISOVUE-300) INJECTION 61%
75.0000 mL | Freq: Once | INTRAVENOUS | Status: AC | PRN
  Administered 2017-08-17: 75 mL via INTRAVENOUS

## 2017-08-21 NOTE — Progress Notes (Signed)
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  INTERVAL HISTORY: Patient returns to clinic today for routine six-month evaluation and discussion of her imaging results.  She continues to feel well and remains asymptomatic.  She has no neurologic complaints.  She denies any recent fevers or illnesses.  She has a good appetite and denies weight loss. She has no chest pain, shortness of breath, cough, or hemoptysis. She denies any nausea, vomiting, constipation, or diarrhea. She has no urinary complaints.  Patient feels at her baseline offers no specific complaints today.  REVIEW OF SYSTEMS:   Review of Systems  Constitutional: Negative.  Negative for fever, malaise/fatigue and weight loss.  Respiratory: Negative.  Negative for cough, hemoptysis and shortness of breath.   Cardiovascular: Negative.  Negative for chest pain and leg swelling.  Gastrointestinal: Negative.  Negative for abdominal pain.  Genitourinary: Negative.  Negative for dysuria.  Musculoskeletal: Negative.  Negative for back pain.  Skin: Negative.  Negative for rash.  Neurological: Negative.  Negative for sensory change, focal weakness and weakness.  Psychiatric/Behavioral: Negative.  The patient is not nervous/anxious.     As per HPI. Otherwise, a complete review of systems is negative.  PAST MEDICAL HISTORY: Past Medical History:  Diagnosis Date  . Allergy   . Anxiety   . Depression   . Fatty liver disease, nonalcoholic   . GERD (gastroesophageal reflux disease)   . Heart disease   . Hypertension   . Lung cancer ([place]) 02/2014   RUL Lobectomy  . Urinary incontinence     PAST SURGICAL HISTORY: Past Surgical History:  Procedure Laterality Date  . ABDOMINAL  HYSTERECTOMY    . LAPAROSCOPIC HYSTERECTOMY  1977  . LARYNGOSCOPY N/A 07/22/2014   Procedure: JET LARYNGOSCOPY with right vocal cord biopsy;  Surgeon: Margaretha Sheffield, MD;  Location: [facility] ORS;  Service: ENT;  Laterality: N/A;  . LUNG CANCER SURGERY      FAMILY HISTORY Family History  Problem Relation Age of Onset  . Kidney cancer Cousin   . Cancer Maternal Grandfather   . Cancer Maternal Grandmother   . Kidney disease Neg Hx   . Prostate cancer Neg Hx        ADVANCED DIRECTIVES:    HEALTH MAINTENANCE: Social History   Tobacco Use  . Smoking status: Former Smoker    Packs/day: 0.50    Years: 25.00    Pack years: 12.50    Types: Cigarettes    Last attempt to quit: 07/18/2006    Years since quitting: 11.1  . Smokeless tobacco: Never Used  Substance Use Topics  . Alcohol use: Yes    Alcohol/week: 1.2 oz    Types: 1 Glasses of wine, 1 Cans of beer per week    Comment: occassionally about 4 times a year  . Drug use: No     Allergies  Allergen Reactions  . Alendronate Other (See Comments)    Esophageal burning  . Sulfa Antibiotics Other (See Comments)  . Ciprofloxacin Other (See Comments)    Other Reaction: GI UPSET  . Diphenhydramine Other (See Comments)    Current Outpatient Medications  Medication Sig Dispense Refill  . Azelastine HCl 0.15 % SOLN Place 1-2 sprays into the nose 2 (two) times daily. 30 mL 0  . benazepril (  LOTENSIN) 5 MG tablet Take 1 tablet by mouth daily.    . calcium citrate-vitamin D 500-400 MG-UNIT chewable tablet Chew 1 tablet by mouth daily.    . Cholecalciferol (D 2000) 2000 UNITS TABS Take 1 tablet by mouth daily.    Marland Kitchen conjugated estrogens (PREMARIN) vaginal cream Place 1 Applicatorful vaginally daily. Apply 0.5mg  (pea-sized amount)  just inside the vaginal introitus with a finger-tip on  Monday, Wednesday and Friday nights. 30 g 12  . EPIPEN 2-PAK 0.3 MG/0.3ML SOAJ injection     . estradiol (ESTRACE) 1 MG tablet     . ibuprofen (ADVIL,MOTRIN)  600 MG tablet Take 1 tablet (600 mg total) by mouth every 8 (eight) hours as needed. 15 tablet 0  . Multiple Vitamins-Minerals (OCUVITE ADULT 50+ PO) Take 1 tablet by mouth daily.     No current facility-administered medications for this visit.     OBJECTIVE: Vitals:   08/24/17 1446  BP: (!) 100/59  Pulse: 76  Resp: 18  Temp: (!) 96.9 F (36.1 C)     Body mass index is 25.99 kg/m.    ECOG FS:0 - Asymptomatic  General: Well-developed, well-nourished, no acute distress. Eyes: Pink conjunctiva, anicteric sclera. HEENT: Normocephalic, moist mucous membranes. Lungs: Clear to auscultation bilaterally. Heart: Regular rate and rhythm. No rubs, murmurs, or gallops. Abdomen: Soft, nontender, nondistended. No organomegaly noted, normoactive bowel sounds. Musculoskeletal: No edema, cyanosis, or clubbing. Neuro: Alert, answering all questions appropriately. Cranial nerves grossly intact. Skin: No rashes or petechiae noted. Psych: Normal affect.   LAB RESULTS:  Lab Results  Component Value Date   NA 137 12/05/2016   K 3.8 12/05/2016   CL 100 (L) 12/05/2016   CO2 28 12/05/2016   GLUCOSE 100 (H) 12/05/2016   BUN 20 05/13/2017   CREATININE 0.90 08/17/2017   CALCIUM 9.0 12/05/2016   PROT 7.7 11/24/2016   ALBUMIN 3.6 11/24/2016   AST 23 11/24/2016   ALT 14 11/24/2016   ALKPHOS 71 11/24/2016   BILITOT 0.6 11/24/2016   GFRNONAA >60 05/13/2017   GFRAA >60 05/13/2017    Lab Results  Component Value Date   WBC 13.9 (H) 12/05/2016   NEUTROABS 11.4 (H) 12/05/2016   HGB 13.6 12/05/2016   HCT 40.7 12/05/2016   MCV 91.5 12/05/2016   PLT 237 12/05/2016     STUDIES: Ct Chest W Contrast  Result Date: 08/17/2017 CLINICAL DATA:  Follow-up right lung cancer status post right upper lobectomy EXAM: CT CHEST WITH CONTRAST TECHNIQUE: Multidetector CT imaging of the chest was performed during intravenous contrast administration. CONTRAST:  106mL ISOVUE-300 IOPAMIDOL (ISOVUE-300) INJECTION 61%  COMPARISON:  05/16/2017 FINDINGS: Cardiovascular: Heart is normal in size.  No pericardial effusion. No evidence of thoracic aortic aneurysm. Very mild atherosclerotic calcifications of the aortic arch. Coronary atherosclerosis the LAD and right coronary artery. Mediastinum/Nodes: No suspicious mediastinal lymphadenopathy. Visualized thyroid is unremarkable. Lungs/Pleura: Status post right upper lobectomy. Subpleural patchy opacity in the lateral right lower lobe now measures 1.9 x 0.9 cm, previously 2.5 x 1.2 cm when measured in a similar fashion, suggesting interval flattening/contraction of the prior lesion. The current appearance/configuration favors resolving post infectious/inflammatory scarring. Stable 6 mm triangular subpleural nodule anteriorly in the right lower lobe along the major fissure (series 4/image 102), likely reflecting a benign subpleural lymph node. Left apical pleural-parenchymal scarring. Faint ground-glass micro nodularity in the lungs bilaterally, predominantly subpleural, measuring up to 3-4 mm, benign. No pleural effusion or pneumothorax. Upper Abdomen: Visualized upper abdomen is grossly unremarkable. Musculoskeletal:  Bilateral breast augmentation. Visualized osseous structures are within normal limits. IMPRESSION: Interval mild contraction of the lateral right lower lobe opacity, favoring post infectious/inflammatory scarring on the current CT. Status post right upper lobectomy. No findings suspicious for recurrent or metastatic disease. Additional ancillary findings as above. Aortic Atherosclerosis (ICD10-I70.0). Electronically Signed   By: Julian Hy M.D.   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  PLAN:     1. 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 Pathology and surgical report reviewed independently.  CT scan results from August 17, 2017 reviewed  independently and report as above with no suspicious findings for recurrent or metastatic disease.  No intervention is needed at this time.  Return to clinic in 6 months with repeat imaging and further evaluation.  If patient's imaging remains stable, she likely can be switched to yearly imaging at that point.   2. Hypertension: Patient's blood pressure is slightly decreased today.  Continue monitoring and evaluation per PCP.  I spent a total of 20 minutes face-to-face with the patient of which greater than 50% of the visit was spent in counseling and coordination of care as detailed above.   Patient expressed understanding and was in agreement with this plan. She also understands that She can call clinic at any time with any questions, concerns, or complaints.    Lloyd Huger, MD   08/29/2017 12:32 PM

## 2017-08-24 ENCOUNTER — Inpatient Hospital Stay: Payer: Medicare Other | Attending: Oncology | Admitting: Oncology

## 2017-08-24 ENCOUNTER — Encounter: Payer: Self-pay | Admitting: Oncology

## 2017-08-24 VITALS — BP 100/59 | HR 76 | Temp 96.9°F | Resp 18 | Ht 65.5 in | Wt 158.6 lb

## 2017-08-24 DIAGNOSIS — Z87891 Personal history of nicotine dependence: Secondary | ICD-10-CM | POA: Diagnosis not present

## 2017-08-24 DIAGNOSIS — I10 Essential (primary) hypertension: Secondary | ICD-10-CM | POA: Diagnosis not present

## 2017-08-24 DIAGNOSIS — C3411 Malignant neoplasm of upper lobe, right bronchus or lung: Secondary | ICD-10-CM

## 2017-08-24 NOTE — Progress Notes (Signed)
Pt has no lung problems from her cancer after she had surgery.

## 2017-08-29 ENCOUNTER — Encounter: Payer: Self-pay | Admitting: Obstetrics & Gynecology

## 2017-08-29 ENCOUNTER — Ambulatory Visit (INDEPENDENT_AMBULATORY_CARE_PROVIDER_SITE_OTHER): Payer: Medicare Other | Admitting: Obstetrics & Gynecology

## 2017-08-29 VITALS — BP 100/60 | Ht 65.5 in | Wt 158.0 lb

## 2017-08-29 DIAGNOSIS — N8111 Cystocele, midline: Secondary | ICD-10-CM | POA: Diagnosis not present

## 2017-08-29 DIAGNOSIS — N393 Stress incontinence (female) (male): Secondary | ICD-10-CM

## 2017-08-29 NOTE — Progress Notes (Signed)
  HPI:      [name] is a 77 y.o.  who presents today for her pessary follow up and examination related to her pelvic floor weakening.  Pt reports tolerating the pessary well with  no vaginal bleeding and  no vaginal discharge.  Symptoms of pelvic floor weakening have greatly improved especially as it pertains to incontinence. She is voiding and defecating without difficulty. She currently has a Incontinence ring #4 pessary newly placed 4 weeks ago.  PMHx: She  has a past medical history of Allergy, Anxiety, Depression, Fatty liver disease, nonalcoholic, GERD (gastroesophageal reflux disease), Heart disease, Hypertension, Lung cancer ([place]) (02/2014), and Urinary incontinence. Also,  has a past surgical history that includes Laryngoscopy (N/A, 07/22/2014); Laparoscopic hysterectomy (1977); Lung cancer surgery; and Abdominal hysterectomy., family history includes Cancer in her maternal grandfather and maternal grandmother; Kidney cancer in her cousin.,  reports that she quit smoking about 11 years ago. Her smoking use included cigarettes. She has a 12.50 pack-year smoking history. She has never used smokeless tobacco. She reports that she drinks about 1.2 oz of alcohol per week. She reports that she does not use drugs.  She has a current medication list which includes the following prescription(s): azelastine hcl, benazepril, calcium citrate-vitamin d, cholecalciferol, conjugated estrogens, epipen 2-pak, estradiol, ibuprofen, and multiple vitamins-minerals. Also, is allergic to alendronate; sulfa antibiotics; ciprofloxacin; and diphenhydramine.  Review of Systems  All other systems reviewed and are negative.  Objective: BP 100/60   Ht 5' 5.5" (1.664 m)   Wt 158 lb (71.7 kg)   BMI 25.89 kg/m  Physical Exam  Constitutional: She is oriented to person, place, and time. She appears well-developed and well-nourished. No distress.  Musculoskeletal: Normal range of motion.  Neurological: She  is alert and oriented to person, place, and time.  Skin: Skin is warm and dry.  Psychiatric: She has a normal mood and affect.  Vitals reviewed.  A/P:1. Stress incontinence of urine 2. Cystocele, midline New pessary treatment plan discussed, will cont w current pessary Instructions given for care. Concerning symptoms to observe for are counseled to patient. Follow up scheduled for every 3 months.  A total of 15 minutes were spent face-to-face with the patient during this encounter and over half of that time dealt with counseling and coordination of care.  Barnett Applebaum, MD, [facility], [facility] 08/29/2017  11:51 AM

## 2017-11-01 ENCOUNTER — Ambulatory Visit (INDEPENDENT_AMBULATORY_CARE_PROVIDER_SITE_OTHER): Payer: Medicare Other | Admitting: Obstetrics & Gynecology

## 2017-11-01 ENCOUNTER — Encounter: Payer: Self-pay | Admitting: Obstetrics & Gynecology

## 2017-11-01 VITALS — BP 110/58 | HR 77 | Ht 64.5 in | Wt 163.0 lb

## 2017-11-01 DIAGNOSIS — N8111 Cystocele, midline: Secondary | ICD-10-CM

## 2017-11-01 DIAGNOSIS — N393 Stress incontinence (female) (male): Secondary | ICD-10-CM | POA: Diagnosis not present

## 2017-11-01 NOTE — Progress Notes (Signed)
  HPI:      [name] is a 77 y.o. who presents today for her pessary follow up and examination related to her pelvic floor weakening.  Pt reports tolerating the pessary well although some sensations at times that it is there, and some urinary retention at times;  with no vaginal bleeding and no vaginal discharge.  Symptoms of pelvic floor weakening have greatly improved. She is voiding and defecating without difficulty. She currently has a #4 Incontinence Ring pessary.  PMHx: She  has a past medical history of Allergy, Anxiety, Depression, Fatty liver disease, nonalcoholic, GERD (gastroesophageal reflux disease), Heart disease, Hypertension, Lung cancer ([place]) (02/2014), and Urinary incontinence. Also,  has a past surgical history that includes Laryngoscopy (N/A, 07/22/2014); Laparoscopic hysterectomy (1977); Lung cancer surgery; and Abdominal hysterectomy., family history includes Cancer in her maternal grandfather and maternal grandmother; Kidney cancer in her cousin.,  reports that she quit smoking about 11 years ago. Her smoking use included cigarettes. She has a 12.50 pack-year smoking history. She has never used smokeless tobacco. She reports that she drinks about 2.0 standard drinks of alcohol per week. She reports that she does not use drugs.  She has a current medication list which includes the following prescription(s): benazepril, calcium citrate-vitamin d, cholecalciferol, conjugated estrogens, estradiol, multiple vitamins-minerals, omeprazole, psyllium, and azelastine hcl. Also, is allergic to alendronate; sulfa antibiotics; ciprofloxacin; and diphenhydramine.  Review of Systems  All other systems reviewed and are negative.   Objective: BP (!) 110/58   Pulse 77   Ht 5' 4.5" (1.638 m)   Wt 163 lb (73.9 kg)   BMI 27.55 kg/m  Physical Exam  Constitutional: She is oriented to person, place, and time. She appears well-developed and well-nourished. No distress.    Genitourinary: Vagina normal. Pelvic exam was performed with patient supine. There is no rash, tenderness or lesion on the right labia. There is no rash, tenderness or lesion on the left labia. No erythema or bleeding in the vagina.  Genitourinary Comments: Cuff intact/ no lesions Gr 2 cystocele Absent uterus and cervix  HENT:  Head: Normocephalic and atraumatic.  Nose: Nose normal.  Mouth/Throat: Oropharynx is clear and moist.  Abdominal: Soft. She exhibits no distension. There is no tenderness.  Musculoskeletal: Normal range of motion.  Neurological: She is alert and oriented to person, place, and time. No cranial nerve deficit.  Skin: Skin is warm and dry.  Psychiatric: She has a normal mood and affect.    Pessary Care Pessary removed and cleaned.  Vagina checked - without erosions - pessary replaced.  A/P: 1. Stress incontinence of urine 2. Cystocele, midline  Pessary was cleaned and replaced with a INCONTINENCE RING #3 today. Instructions given for care. Concerning symptoms to observe for are counseled to patient. Follow up scheduled for 3 months.  A total of 15 minutes were spent face-to-face with the patient during this encounter and over half of that time dealt with counseling and coordination of care.  Barnett Applebaum, MD, [facility], [facility] 11/01/2017  3:29 PM

## 2017-12-14 ENCOUNTER — Encounter: Payer: Self-pay | Admitting: Emergency Medicine

## 2017-12-14 ENCOUNTER — Ambulatory Visit
Admission: EM | Admit: 2017-12-14 | Discharge: 2017-12-14 | Disposition: A | Discharge status: Home / Self Care | Payer: Medicare Other | Attending: Family Medicine | Admitting: Family Medicine

## 2017-12-14 ENCOUNTER — Ambulatory Visit (INDEPENDENT_AMBULATORY_CARE_PROVIDER_SITE_OTHER)
Admit: 2017-12-14 | Discharge: 2017-12-14 | Disposition: A | Discharge status: Home / Self Care | Payer: Medicare Other | Attending: Family Medicine | Admitting: Family Medicine

## 2017-12-14 ENCOUNTER — Other Ambulatory Visit: Payer: Self-pay

## 2017-12-14 DIAGNOSIS — Y92009 Unspecified place in unspecified non-institutional (private) residence as the place of occurrence of the external cause: Secondary | ICD-10-CM | POA: Diagnosis not present

## 2017-12-14 DIAGNOSIS — S0990XA Unspecified injury of head, initial encounter: Secondary | ICD-10-CM

## 2017-12-14 DIAGNOSIS — W1830XA Fall on same level, unspecified, initial encounter: Secondary | ICD-10-CM

## 2017-12-14 DIAGNOSIS — S0003XA Contusion of scalp, initial encounter: Secondary | ICD-10-CM | POA: Diagnosis not present

## 2017-12-14 NOTE — ED Provider Notes (Signed)
[facility]    CSN: [id number] Arrival date & time: 12/14/17  0915     History   Chief Complaint Chief Complaint  Patient presents with  . Fall  . Head Injury    HPI Christy [name] is a 77 y.o. female.   77 yo female with a c/o headache since hitting her head last night when she fell at home. States she hit the back of her head on her hardwood floor. Denies loss of consciousness, vision changes, vomiting, numbness/tingling. However does state that she felt "woozy" and "kind of out of it" after shortly after hitting her head.   The history is provided by the patient.  Fall   Head Injury    Past Medical History:  Diagnosis Date  . Allergy   . Anxiety   . Depression   . Fatty liver disease, nonalcoholic   . GERD (gastroesophageal reflux disease)   . Heart disease   . Hypertension   . Lung cancer ([place]) 02/2014   RUL Lobectomy  . Urinary incontinence     Patient Active Problem List   Diagnosis Date Noted  . Cystocele, midline 04/28/2017  . Stress incontinence of urine 10/15/2014  . Polypharmacy 09/02/2014  . Primary cancer of right upper lobe of lung ([place]) 07/19/2014  . Acid reflux 07/18/2014  . 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 Chronic kidney disease (CKD), stage III (moderate) ([place]) 08/30/2013  . Benign essential HTN 08/30/2013  . Fatty liver disease, nonalcoholic 35/36/1443  . Fibrositis 08/30/2013  . Hot flash, menopausal 08/30/2013  . Billowing mitral valve 08/30/2013  . Allergic rhinitis, seasonal 08/30/2013  . Fibromyalgia 08/30/2013    Past Surgical History:  Procedure Laterality Date  . ABDOMINAL HYSTERECTOMY    . LAPAROSCOPIC HYSTERECTOMY  1977  . LARYNGOSCOPY N/A 07/22/2014   Procedure: JET LARYNGOSCOPY with right vocal cord biopsy;  Surgeon: Margaretha Sheffield, MD;  Location: [facility] ORS;  Service: ENT;  Laterality: N/A;  . LUNG CANCER SURGERY      OB History   None      Home  Medications    Prior to Admission medications   Medication Sig Start Date End Date Taking? Authorizing Provider  benazepril (LOTENSIN) 5 MG tablet Take 1 tablet by mouth daily. 03/04/14  Yes [provider]  calcium citrate-vitamin D 500-400 MG-UNIT chewable tablet Chew 1 tablet by mouth daily.   Yes [provider]  cetirizine (ZYRTEC) 10 MG tablet Take 10 mg by mouth daily.   Yes [provider]  Cholecalciferol (D 2000) 2000 UNITS TABS Take 1 tablet by mouth daily.   Yes [provider]  estradiol (ESTRACE) 1 MG tablet  04/29/17  Yes [provider]  Multiple Vitamins-Minerals (OCUVITE ADULT 50+ PO) Take 1 tablet by mouth daily.   Yes [provider]  psyllium (METAMUCIL) 0.52 g capsule Take by mouth.   Yes [provider]  vitamin B-12 (CYANOCOBALAMIN) 1000 MCG tablet Take 1,000 mcg by mouth daily.   Yes [provider]  Azelastine HCl 0.15 % SOLN Place 1-2 sprays into the nose 2 (two) times daily. 01/14/16 08/24/17  Betancourt, Aura Fey, NP  conjugated estrogens (PREMARIN) vaginal cream Place 1 Applicatorful vaginally daily. Apply 0.5mg  (pea-sized amount)  just inside the vaginal introitus with a finger-tip on  Monday, Wednesday and Friday nights. 04/15/17   Nori Riis, PA-C  omeprazole (PRILOSEC) 40 MG capsule  09/23/17   [provider]    Family History  Family History  Problem Relation Age of Onset  . Kidney cancer Cousin   . Cancer Maternal Grandfather   . Cancer Maternal Grandmother   . Kidney disease Neg Hx   . Prostate cancer Neg Hx     Social History Social History   Tobacco Use  . Smoking status: Former Smoker    Packs/day: 0.50    Years: 25.00    Pack years: 12.50    Types: Cigarettes    Last attempt to quit: 07/18/2006    Years since quitting: 11.4  . Smokeless tobacco: Never Used  Substance Use Topics  . Alcohol use: Yes    Alcohol/week: 2.0 standard drinks    Types: 1 Glasses of  wine, 1 Cans of beer per week    Comment: occassionally about 4 times a year  . Drug use: No     Allergies   Alendronate; Sulfa antibiotics; Ciprofloxacin; and Diphenhydramine   Review of Systems Review of Systems   Physical Exam Triage Vital Signs ED Triage Vitals  Enc Vitals Group     BP 12/14/17 0931 (!) 143/76     Pulse Rate 12/14/17 0931 74     Resp 12/14/17 0931 16     Temp 12/14/17 0931 97.8 F (36.6 C)     Temp Source 12/14/17 0931 Oral     SpO2 12/14/17 0931 100 %     Weight 12/14/17 0927 160 lb (72.6 kg)     Height 12/14/17 0927 5\' 6"  (1.676 m)     Head Circumference --      Peak Flow --      Pain Score 12/14/17 0927 2     Pain Loc --      Pain Edu? --      Excl. in [place]? --    No data found.  Updated Vital Signs BP (!) 143/76 (BP Location: Right Arm)   Pulse 74   Temp 97.8 F (36.6 C) (Oral)   Resp 16   Ht 5\' 6"  (1.676 m)   Wt 72.6 kg   SpO2 100%   BMI 25.82 kg/m   Visual Acuity Right Eye Distance:   Left Eye Distance:   Bilateral Distance:    Right Eye Near:   Left Eye Near:    Bilateral Near:     Physical Exam  Constitutional: She is oriented to person, place, and time. She appears well-developed and well-nourished. No distress.  HENT:  Head: Normocephalic. Head is with contusion (scalp).  Right Ear: Tympanic membrane, external ear and ear canal normal.  Left Ear: Tympanic membrane, external ear and ear canal normal.  Nose: Nose normal.  Mouth/Throat: Oropharynx is clear and moist and mucous membranes are normal.  Eyes: Pupils are equal, round, and reactive to light. Conjunctivae and EOM are normal. Right eye exhibits no discharge. Left eye exhibits no discharge. No scleral icterus.  Neck: Normal range of motion. Neck supple. No JVD present. No tracheal deviation present. No thyromegaly present.  Cardiovascular: Normal rate, regular rhythm, normal heart sounds and intact distal pulses.  No murmur heard. Pulmonary/Chest: Effort normal and  breath sounds normal. No stridor. No respiratory distress. She has no wheezes. She has no rales. She exhibits no tenderness.  Musculoskeletal: She exhibits no edema or tenderness.  Lymphadenopathy:    She has no cervical adenopathy.  Neurological: She is alert and oriented to person, place, and time. She has normal reflexes. She displays normal reflexes. No cranial nerve deficit or sensory deficit. She exhibits normal muscle  tone. Coordination normal.  Skin: Skin is warm and dry. No rash noted. She is not diaphoretic. No erythema. No pallor.  Psychiatric: She has a normal mood and affect. Her behavior is normal. Judgment and thought content normal.  Vitals reviewed.    UC Treatments / Results  Labs (all labs ordered are listed, but only abnormal results are displayed) Labs Reviewed - No data to display  EKG None  Radiology Ct Head Wo Contrast  Result Date: 12/14/2017 CLINICAL DATA:  Posttraumatic headache EXAM: CT HEAD WITHOUT CONTRAST TECHNIQUE: Contiguous axial images were obtained from the base of the skull through the vertex without intravenous contrast. COMPARISON:  None. FINDINGS: Brain: No evidence of acute infarction, hemorrhage, hydrocephalus, extra-axial collection or mass lesion/mass effect. Mild for age cerebral and cerebellar volume loss. Moderate low-density in the cerebral white matter. Vascular: Atherosclerotic calcification.  No hyperdense vessel Skull: Negative for fracture or destructive process. Sinuses/Orbits: Bilateral cataract resection. IMPRESSION: 1. No evidence of intracranial injury. 2. Moderate chronic small vessel ischemia. Electronically Signed   By: Monte Fantasia M.D.   On: 12/14/2017 10:32    Procedures Procedures (including critical care time)  Medications Ordered in UC Medications - No data to display  Initial Impression / Assessment and Plan / UC Course  I have reviewed the triage vital signs and the nursing notes.  Pertinent labs & imaging  results that were available during my care of the patient were reviewed by me and considered in my medical decision making (see chart for details).      Final Clinical Impressions(s) / UC Diagnoses   Final diagnoses:  Injury of head, initial encounter  Contusion of scalp, initial encounter     Discharge Instructions     Rest, tylenol Follow up if symptoms worsen or don't improve    ED Prescriptions    None      1. Head CT scan results (negative for acute injury) and diagnosis reviewed with patient 2. Recommend supportive treatment as above 3. Follow-up prn if symptoms worsen or don't improve  Controlled Substance Prescriptions [place] Controlled Substance Registry consulted? Not Applicable   Norval Gable, MD 12/14/17 1201

## 2017-12-14 NOTE — Discharge Instructions (Signed)
Rest, tylenol Follow up if symptoms worsen or don't improve

## 2017-12-14 NOTE — ED Triage Notes (Signed)
Patient states that she fell last night at her home and hit the back right side of her head on her hardwood floors.  Patient c/o headache that started this morning.  Patient denies N/V.

## 2018-01-13 DIAGNOSIS — R269 Unspecified abnormalities of gait and mobility: Secondary | ICD-10-CM | POA: Insufficient documentation

## 2018-01-23 ENCOUNTER — Ambulatory Visit: Payer: Medicare Other | Admitting: Obstetrics & Gynecology

## 2018-01-25 ENCOUNTER — Other Ambulatory Visit: Payer: Self-pay | Admitting: Chiropractic Medicine

## 2018-01-25 ENCOUNTER — Ambulatory Visit
Admission: RE | Admit: 2018-01-25 | Discharge: 2018-01-25 | Disposition: A | Discharge status: Home / Self Care | Payer: Medicare Other | Source: Ambulatory Visit | Attending: Chiropractic Medicine | Admitting: Chiropractic Medicine

## 2018-01-25 DIAGNOSIS — M25552 Pain in left hip: Secondary | ICD-10-CM

## 2018-01-25 DIAGNOSIS — M25551 Pain in right hip: Secondary | ICD-10-CM

## 2018-01-30 ENCOUNTER — Ambulatory Visit: Payer: Medicare Other | Admitting: Obstetrics & Gynecology

## 2018-01-30 ENCOUNTER — Encounter: Payer: Self-pay | Admitting: Obstetrics & Gynecology

## 2018-01-30 VITALS — BP 120/80 | Ht 65.5 in | Wt 161.0 lb

## 2018-01-30 DIAGNOSIS — N8111 Cystocele, midline: Secondary | ICD-10-CM

## 2018-01-30 DIAGNOSIS — N393 Stress incontinence (female) (male): Secondary | ICD-10-CM

## 2018-01-30 NOTE — Progress Notes (Signed)
  HPI:      [name] is a 77 y.o. who presents today for her pessary follow up and examination related to her pelvic floor weakening.  Pt reports tolerating the pessary well with  no vaginal bleeding and  no vaginal discharge.  Symptoms of pelvic floor weakening have greatly improved. She is voiding and defecating without difficulty. She currently has a Research scientist (physical sciences) #3 pessary.  Desires to change back to larger size, which she has brought w her (she is having more incontinence bc she is drinking more water for her neuropathy tx)  PMHx: She  has a past medical history of Allergy, Anxiety, Depression, Fatty liver disease, nonalcoholic, GERD (gastroesophageal reflux disease), Heart disease, Hypertension, Lung cancer ([place]) (02/2014), and Urinary incontinence. Also,  has a past surgical history that includes Laryngoscopy (N/A, 07/22/2014); Laparoscopic hysterectomy (1977); Lung cancer surgery; and Abdominal hysterectomy., family history includes Cancer in her maternal grandfather and maternal grandmother; Kidney cancer in her cousin.,  reports that she quit smoking about 11 years ago. Her smoking use included cigarettes. She has a 12.50 pack-year smoking history. She has never used smokeless tobacco. She reports current alcohol use of about 2.0 standard drinks of alcohol per week. She reports that she does not use drugs.  She has a current medication list which includes the following prescription(s): azelastine hcl, benazepril, calcium citrate-vitamin d, cetirizine, cholecalciferol, conjugated estrogens, estradiol, multiple vitamins-minerals, omeprazole, psyllium, and vitamin b-12. Also, is allergic to alendronate; sulfa antibiotics; ciprofloxacin; and diphenhydramine.  Review of Systems  All other systems reviewed and are negative.   Objective: BP 120/80   Ht 5' 5.5" (1.664 m)   Wt 161 lb (73 kg)   BMI 26.38 kg/m  Physical Exam Constitutional:      General: She is not in acute  distress.    Appearance: She is well-developed.  Genitourinary:     Pelvic exam was performed with patient supine.     Vagina normal.     No vaginal erythema or bleeding.     Genitourinary Comments: Cuff intact/ no lesions Cystocele gr 2 Incontinence w cough Absent uterus and cervix  HENT:     Head: Normocephalic and atraumatic.     Nose: Nose normal.  Abdominal:     General: There is no distension.     Palpations: Abdomen is soft.     Tenderness: There is no abdominal tenderness.  Musculoskeletal: Normal range of motion.  Neurological:     Mental Status: She is alert and oriented to person, place, and time.     Cranial Nerves: No cranial nerve deficit.  Skin:    General: Skin is warm and dry.   Pessary Care Pessary removed and cleaned.  Vagina checked - without erosions - pessary replaced.  A/P:1. Cystocele, midline 2. Stress incontinence of urine Pessary was cleaned and replaced today w Incontinence ring #4. Instructions given for care. Concerning symptoms to observe for are counseled to patient. Follow up scheduled for 3 months.  A total of 15 minutes were spent face-to-face with the patient during this encounter and over half of that time dealt with counseling and coordination of care.  Barnett Applebaum, MD, [facility], [facility] 01/30/2018  9:23 AM

## 2018-02-06 ENCOUNTER — Ambulatory Visit: Payer: Medicare Other | Attending: Internal Medicine | Admitting: Physical Therapy

## 2018-02-06 ENCOUNTER — Ambulatory Visit: Payer: Medicare Other | Admitting: Physical Therapy

## 2018-02-06 DIAGNOSIS — M6281 Muscle weakness (generalized): Secondary | ICD-10-CM | POA: Insufficient documentation

## 2018-02-06 DIAGNOSIS — R296 Repeated falls: Secondary | ICD-10-CM

## 2018-02-06 DIAGNOSIS — R269 Unspecified abnormalities of gait and mobility: Secondary | ICD-10-CM | POA: Insufficient documentation

## 2018-02-06 NOTE — Patient Instructions (Signed)
Access Code: [email or web address]  URL: [email or web address]  Date: 02/06/2018  Prepared by: Dorcas Carrow   Exercises  Mini Squat with Counter Support - 20 reps - 1 sets - 1x daily - 7x weekly  Standing March with Counter Support - 20 reps - 1 sets - 1x daily - 7x weekly  Standing Hip Extension with Counter Support - 20 reps - 1 sets - 1x daily - 7x weekly  Standing Hip Abduction with Counter Support - 20 reps - 1 sets - 1x daily - 7x weekly  Heel rises with counter support - 20 reps - 1 sets - 1x daily - 7x weekly  Standing Tandem Balance with Counter Support - 3 reps - 1 sets - 20 hold - 1x daily - 7x weekly

## 2018-02-07 ENCOUNTER — Encounter: Payer: Self-pay | Admitting: Physical Therapy

## 2018-02-07 NOTE — Therapy (Addendum)
[facility] [address] [place], [place], [address] Phone: [phone]   Fax:  [phone]  Physical Therapy Evaluation  Patient Details  Name: Christy [name] MRN: [id number] Date of Birth: 04/29/1940 Referring Provider (PT): Dr. Raechel Ache   Encounter Date: 02/06/2018  PT End of Session - 02/21/18 1727    Visit Number  1    Number of Visits  8    Date for PT Re-Evaluation  03/06/18    PT Start Time  8921    PT Stop Time  1524    PT Time Calculation (min)  51 min    Equipment Utilized During Treatment  Gait belt    Activity Tolerance  Patient tolerated treatment well;Patient limited by pain    Behavior During Therapy  [facility] for tasks assessed/performed       Past Medical History:  Diagnosis Date  . Allergy   . Anxiety   . Depression   . Fatty liver disease, nonalcoholic   . GERD (gastroesophageal reflux disease)   . Heart disease   . Hypertension   . Lung cancer ([place]) 02/2014   RUL Lobectomy  . Urinary incontinence     Past Surgical History:  Procedure Laterality Date  . ABDOMINAL HYSTERECTOMY    . LAPAROSCOPIC HYSTERECTOMY  1977  . LARYNGOSCOPY N/A 07/22/2014   Procedure: JET LARYNGOSCOPY with right vocal cord biopsy;  Surgeon: Margaretha Sheffield, MD;  Location: [facility] ORS;  Service: ENT;  Laterality: N/A;  . LUNG CANCER SURGERY      There were no vitals filed for this visit.   Subjective Assessment - 02/21/18 1717    Subjective  Pt. reports chronic h/o B LE neuropathy/ recurrent falls/ balance issues.  Pt. had a bad fall on 12/14/17 resulting in a closed head injury.  Pt. has stopped exercising over past 2 months.      Limitations  Lifting;Standing;Walking;House hold activities    How long can you stand comfortably?  5 minutes    Patient Stated Goals  Increase B LE muscle strength/ improve balance and independence with gait.      Currently in Pain?  Yes    Pain Score  3     Pain Location  Hip    Pain Orientation   Right;Left    Pain Descriptors / Indicators  Aching    Pain Type  Chronic pain         See HEP (handouts issued).       PT Education - 02/21/18 1727    Education Details  See HEP    Person(s) Educated  Patient    Methods  Explanation;Demonstration;Handout    Comprehension  Verbalized understanding;Returned demonstration          PT Long Term Goals - 02/21/18 1740      PT LONG TERM GOAL #1   Title  Pt. independent with HEP to increase B hip strength to improve functional mobility/ independence with gait.      Baseline  B LE muscle strength grossly 5/5 MMT except hip flexion 4/5 MMT (slight increase c/o pain).      Time  4    Period  Weeks    Status  New    Target Date  03/06/18      PT LONG TERM GOAL #2   Title  Pt. will increase FOTO to 45 to improve functional mobility.      Baseline  Initial FOTO: 28    Time  4  Period  Weeks    Status  New    Target Date  03/06/18      PT LONG TERM GOAL #3   Title  Pt. will increase Berg balance test to >50/56 to improve safety/ independence with walking.      Baseline  Berg: 45/56    Time  4    Period  Weeks    Status  New    Target Date  03/06/18      PT LONG TERM GOAL #4   Title  Pt. able to ascend/ descend stairs with recip. pattern and no UE assist safely to improve independence/ mobility.      Baseline  Heavy use of B UE assist on handrails with stair climbing.      Time  4    Period  Weeks    Status  New    Target Date  03/06/18             Plan - 02/21/18 1728    Clinical Impression Statement  Pt. is a pleasant 77 y/o female with recurrent falls and weakness in B hips.  Pt. reports 3/10 B thigh/ hip pain at rest.  Pt. presents with forward head/ rounded shoulder posture.  Pt. presents with good B hip/LE AROM and strength grossly 5/5 MMT except hip flexion 4/5 MMT (slight increase in hip pain).  Berg balance test: 45/56.  FOTO: 28/45.  Pt. requires light UE assist for safety with dynamic standing/ balance  tasks.  Pt. ambulates with slight antalgic gait pattern and able to ascend/descend stairs with recip. pattern and B UE assist.  Pt. will benefit from skilled PT services to develop a strengthening ex. program to promote independence with gait/ balance.      History and Personal Factors relevant to plan of care:  Pt. reports chronic h/o B hip/thigh pain (3/10).  Pt. lives with husband and has 1 step in living room (no issues).  Pt. reports frequent falls and had a bad fall on 12/14/2017 resulting in a closed head injury.  Pt. requires B UE assist with standing ex./ balance tasks.  Pt. very careful to get in/out of car.      Clinical Presentation  Evolving    Clinical Decision Making  Moderate    Rehab Potential  Fair    PT Frequency  2x / week    PT Duration  4 weeks    PT Treatment/Interventions  ADLs/Self Care Home Management;Moist Heat;Gait training;Stair training;Functional mobility training;Neuromuscular re-education;Balance training;Therapeutic exercise;Therapeutic activities;Patient/family education;Manual techniques;Passive range of motion    PT Next Visit Plan  Progress B LE muscle strengthening/ balance ex.     PT Home Exercise Plan  See HEP       Patient will benefit from skilled therapeutic intervention in order to improve the following deficits and impairments:  Abnormal gait, Improper body mechanics, Pain, Postural dysfunction, Decreased activity tolerance, Decreased endurance, Decreased range of motion, Decreased strength, Impaired flexibility, Difficulty walking, Decreased balance  Visit Diagnosis: Gait difficulty  Recurrent falls  Muscle weakness (generalized)     Problem List Patient Active Problem List   Diagnosis Date Noted  . Cystocele, midline 04/28/2017  . Stress incontinence of urine 10/15/2014  . Polypharmacy 09/02/2014  . Primary cancer of right upper lobe of lung ([place]) 07/19/2014  . Acid reflux 07/18/2014  . 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 Osteoporosis,  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  . Chronic kidney disease (CKD), stage III (moderate) ([place]) 08/30/2013  . Benign essential HTN 08/30/2013  .  Fatty liver disease, nonalcoholic 07/62/2633  . Fibrositis 08/30/2013  . Hot flash, menopausal 08/30/2013  . Billowing mitral valve 08/30/2013  . Allergic rhinitis, seasonal 08/30/2013  . Fibromyalgia 08/30/2013   Pura Spice, PT, DPT # [phone] 02/21/2018, 5:45 PM  [facility] [address] [place], [place], [address] Phone: [phone]   Fax:  [phone]  Name: Christy [name] MRN: [id number] Date of Birth: March 26, 1940

## 2018-02-15 ENCOUNTER — Ambulatory Visit: Payer: Medicare Other | Admitting: Physical Therapy

## 2018-02-16 ENCOUNTER — Ambulatory Visit: Payer: Medicare Other | Attending: Internal Medicine | Admitting: Physical Therapy

## 2018-02-16 DIAGNOSIS — R296 Repeated falls: Secondary | ICD-10-CM | POA: Insufficient documentation

## 2018-02-16 DIAGNOSIS — M6281 Muscle weakness (generalized): Secondary | ICD-10-CM | POA: Insufficient documentation

## 2018-02-16 DIAGNOSIS — R269 Unspecified abnormalities of gait and mobility: Secondary | ICD-10-CM | POA: Insufficient documentation

## 2018-02-21 NOTE — Addendum Note (Signed)
Addended by: Pura Spice on: 02/21/2018 05:47 PM   Modules accepted: Orders

## 2018-02-22 ENCOUNTER — Ambulatory Visit: Payer: Medicare Other | Admitting: Physical Therapy

## 2018-02-24 ENCOUNTER — Ambulatory Visit: Admission: RE | Admit: 2018-02-24 | Payer: Medicare Other | Source: Ambulatory Visit

## 2018-02-24 ENCOUNTER — Ambulatory Visit: Payer: Medicare Other | Admitting: Physical Therapy

## 2018-02-24 ENCOUNTER — Ambulatory Visit
Admission: RE | Admit: 2018-02-24 | Discharge: 2018-02-24 | Disposition: A | Discharge status: Home / Self Care | Payer: Medicare Other | Source: Ambulatory Visit | Attending: Oncology | Admitting: Oncology

## 2018-02-24 DIAGNOSIS — I700 Atherosclerosis of aorta: Secondary | ICD-10-CM | POA: Insufficient documentation

## 2018-02-24 DIAGNOSIS — C3411 Malignant neoplasm of upper lobe, right bronchus or lung: Secondary | ICD-10-CM | POA: Insufficient documentation

## 2018-02-24 LAB — POCT I-STAT CREATININE: Creatinine, Ser: 1 mg/dL (ref 0.44–1.00)

## 2018-02-24 MED ORDER — IOPAMIDOL (ISOVUE-300) INJECTION 61%
60.0000 mL | Freq: Once | INTRAVENOUS | Status: AC | PRN
  Administered 2018-02-24: 60 mL via INTRAVENOUS

## 2018-02-26 NOTE — Progress Notes (Deleted)
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  INTERVAL HISTORY: Patient returns to clinic today for routine six-month evaluation and discussion of her imaging results.  She continues to feel well and remains asymptomatic.  She has no neurologic complaints.  She denies any recent fevers or illnesses.  She has a good appetite and denies weight loss. She has no chest pain, shortness of breath, cough, or hemoptysis. She denies any nausea, vomiting, constipation, or diarrhea. She has no urinary complaints.  Patient feels at her baseline offers no specific complaints today.  REVIEW OF SYSTEMS:   Review of Systems  Constitutional: Negative.  Negative for fever, malaise/fatigue and weight loss.  Respiratory: Negative.  Negative for cough, hemoptysis and shortness of breath.   Cardiovascular: Negative.  Negative for chest pain and leg swelling.  Gastrointestinal: Negative.  Negative for abdominal pain.  Genitourinary: Negative.  Negative for dysuria.  Musculoskeletal: Negative.  Negative for back pain.  Skin: Negative.  Negative for rash.  Neurological: Negative.  Negative for sensory change, focal weakness and weakness.  Psychiatric/Behavioral: Negative.  The patient is not nervous/anxious.     As per HPI. Otherwise, a complete review of systems is negative.  PAST MEDICAL HISTORY: Past Medical History:  Diagnosis Date  . Allergy   . Anxiety   . Depression   . Fatty liver disease, nonalcoholic   . GERD (gastroesophageal reflux disease)   . Heart disease   . Hypertension   . Lung cancer ([place]) 02/2014   RUL Lobectomy  . Urinary incontinence     PAST SURGICAL HISTORY: Past Surgical History:  Procedure Laterality Date  . ABDOMINAL  HYSTERECTOMY    . LAPAROSCOPIC HYSTERECTOMY  1977  . LARYNGOSCOPY N/A 07/22/2014   Procedure: JET LARYNGOSCOPY with right vocal cord biopsy;  Surgeon: Margaretha Sheffield, MD;  Location: [facility] ORS;  Service: ENT;  Laterality: N/A;  . LUNG CANCER SURGERY      FAMILY HISTORY Family History  Problem Relation Age of Onset  . Kidney cancer Cousin   . Cancer Maternal Grandfather   . Cancer Maternal Grandmother   . Kidney disease Neg Hx   . Prostate cancer Neg Hx        ADVANCED DIRECTIVES:    HEALTH MAINTENANCE: Social History   Tobacco Use  . Smoking status: Former Smoker    Packs/day: 0.50    Years: 25.00    Pack years: 12.50    Types: Cigarettes    Last attempt to quit: 07/18/2006    Years since quitting: 11.6  . Smokeless tobacco: Never Used  Substance Use Topics  . Alcohol use: Yes    Alcohol/week: 2.0 standard drinks    Types: 1 Glasses of wine, 1 Cans of beer per week    Comment: occassionally about 4 times a year  . Drug use: No     Allergies  Allergen Reactions  . Alendronate Other (See Comments)    Esophageal burning  . Sulfa Antibiotics Other (See Comments)  . Ciprofloxacin Other (See Comments)    Other Reaction: GI UPSET  . Diphenhydramine Other (See Comments)    Current Outpatient Medications  Medication Sig Dispense Refill  . Azelastine HCl 0.15 % SOLN Place 1-2 sprays into the nose 2 (two) times daily. 30 mL 0  .  benazepril (LOTENSIN) 5 MG tablet Take 1 tablet by mouth daily.    . calcium citrate-vitamin D 500-400 MG-UNIT chewable tablet Chew 1 tablet by mouth daily.    . cetirizine (ZYRTEC) 10 MG tablet Take 10 mg by mouth daily.    . Cholecalciferol (D 2000) 2000 UNITS TABS Take 1 tablet by mouth daily.    Marland Kitchen conjugated estrogens (PREMARIN) vaginal cream Place 1 Applicatorful vaginally daily. Apply 0.5mg  (pea-sized amount)  just inside the vaginal introitus with a finger-tip on  Monday, Wednesday and Friday nights. 30 g 12  . estradiol (ESTRACE) 1 MG tablet      . Multiple Vitamins-Minerals (OCUVITE ADULT 50+ PO) Take 1 tablet by mouth daily.    Marland Kitchen omeprazole (PRILOSEC) 40 MG capsule     . psyllium (METAMUCIL) 0.52 g capsule Take by mouth.    . vitamin B-12 (CYANOCOBALAMIN) 1000 MCG tablet Take 1,000 mcg by mouth daily.     No current facility-administered medications for this visit.     OBJECTIVE: There were no vitals filed for this visit.   There is no height or weight on file to calculate BMI.    ECOG FS:0 - Asymptomatic  General: Well-developed, well-nourished, no acute distress. Eyes: Pink conjunctiva, anicteric sclera. HEENT: Normocephalic, moist mucous membranes. Lungs: Clear to auscultation bilaterally. Heart: Regular rate and rhythm. No rubs, murmurs, or gallops. Abdomen: Soft, nontender, nondistended. No organomegaly noted, normoactive bowel sounds. Musculoskeletal: No edema, cyanosis, or clubbing. Neuro: Alert, answering all questions appropriately. Cranial nerves grossly intact. Skin: No rashes or petechiae noted. Psych: Normal affect.   LAB RESULTS:  Lab Results  Component Value Date   NA 137 12/05/2016   K 3.8 12/05/2016   CL 100 (L) 12/05/2016   CO2 28 12/05/2016   GLUCOSE 100 (H) 12/05/2016   BUN 20 05/13/2017   CREATININE 1.00 02/24/2018   CALCIUM 9.0 12/05/2016   PROT 7.7 11/24/2016   ALBUMIN 3.6 11/24/2016   AST 23 11/24/2016   ALT 14 11/24/2016   ALKPHOS 71 11/24/2016   BILITOT 0.6 11/24/2016   GFRNONAA >60 05/13/2017   GFRAA >60 05/13/2017    Lab Results  Component Value Date   WBC 13.9 (H) 12/05/2016   NEUTROABS 11.4 (H) 12/05/2016   HGB 13.6 12/05/2016   HCT 40.7 12/05/2016   MCV 91.5 12/05/2016   PLT 237 12/05/2016     STUDIES: Ct Chest W Contrast  Result Date: 02/24/2018 CLINICAL DATA:  78 year old female with history of right upper lobectomy in 2016 for lung cancer. Currently asymptomatic. EXAM: CT CHEST WITH CONTRAST TECHNIQUE: Multidetector CT imaging of the chest was performed during  intravenous contrast administration. CONTRAST:  76mL ISOVUE-300 IOPAMIDOL (ISOVUE-300) INJECTION 61% COMPARISON:  Chest CT 08/17/2017. FINDINGS: Cardiovascular: Heart size is normal. There is no significant pericardial fluid, thickening or pericardial calcification. There is aortic atherosclerosis, as well as atherosclerosis of the great vessels of the mediastinum and the coronary arteries, including calcified atherosclerotic plaque in the left anterior descending and right coronary arteries. Calcifications of the inferior aspect of the mitral annulus. Mediastinum/Nodes: No pathologically enlarged mediastinal or hilar lymph nodes. Esophagus is unremarkable in appearance. No axillary lymphadenopathy. Lungs/Pleura: Status post right upper lobectomy. Compensatory hyperexpansion of the right middle and lower lobes. Further contraction of area of area of scarring in the periphery of the right lower lobe (axial image 110 of series 3). Multiple small pulmonary nodules scattered throughout the lungs bilaterally measuring 5 mm or less in size, stable in size and number compared  to the prior studies dating back to at least 05/27/2015, considered definitively benign. No other suspicious appearing pulmonary nodules or masses are noted. No acute consolidative airspace disease. No pleural effusions. Upper Abdomen: Aortic atherosclerosis. Musculoskeletal: Bilateral subpectoral breast implants are incidentally noted. Postthoracotomy changes in the right hemithorax redemonstrated. There are no aggressive appearing lytic or blastic lesions noted in the visualized portions of the skeleton. IMPRESSION: 1. Status post right upper lobectomy. No findings to suggest local recurrence of disease or metastatic disease in the thorax. 2. Aortic atherosclerosis, in addition to 2 vessel coronary artery disease. Assessment for potential risk factor modification, dietary therapy or pharmacologic therapy may be warranted, if clinically indicated. 3.  There are calcifications of the mitral annulus. Echocardiographic correlation for evaluation of potential valvular dysfunction may be warranted if clinically indicated. Aortic Atherosclerosis (ICD10-I70.0). Electronically Signed   By: Vinnie Langton M.D.   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  PLAN:     1. 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 Pathology and surgical report reviewed independently.  CT scan results from August 17, 2017 reviewed independently and report as above with no suspicious findings for recurrent or metastatic disease.  No intervention is needed at this time.  Return to clinic in 6 months with repeat imaging and further evaluation.  If patient's imaging remains stable, she likely can be switched to yearly imaging at that point.   2. Hypertension: Patient's blood pressure is slightly decreased today.  Continue monitoring and evaluation per PCP.  I spent a total of 20 minutes face-to-face with the patient of which greater than 50% of the visit was spent in counseling and coordination of care as detailed above.   Patient expressed understanding and was in agreement with this plan. She also understands that She can call clinic at any time with any questions, concerns, or complaints.    Lloyd Huger, MD   02/26/2018 9:02 AM

## 2018-02-27 ENCOUNTER — Ambulatory Visit: Payer: Medicare Other | Admitting: Physical Therapy

## 2018-02-27 ENCOUNTER — Encounter: Payer: Self-pay | Admitting: Physical Therapy

## 2018-02-27 DIAGNOSIS — R296 Repeated falls: Secondary | ICD-10-CM

## 2018-02-27 DIAGNOSIS — M6281 Muscle weakness (generalized): Secondary | ICD-10-CM | POA: Diagnosis present

## 2018-02-27 DIAGNOSIS — R269 Unspecified abnormalities of gait and mobility: Secondary | ICD-10-CM | POA: Diagnosis not present

## 2018-02-27 NOTE — Therapy (Signed)
[facility] [address]. [place], [place], [address] Phone: [phone]   Fax:  [phone]  Physical Therapy Treatment  Patient Details  Name: Christy [name] MRN: [id number] Date of Birth: March 26, 1940 Referring Provider (PT): Dr. Raechel Ache   Encounter Date: 02/27/2018  PT End of Session - 02/27/18 1606    Visit Number  2    Number of Visits  8    Date for PT Re-Evaluation  03/06/18    PT Start Time  4403    PT Stop Time  1532    PT Time Calculation (min)  55 min    Equipment Utilized During Treatment  Gait belt    Activity Tolerance  Patient tolerated treatment well;Patient limited by pain    Behavior During Therapy  [facility] for tasks assessed/performed       Past Medical History:  Diagnosis Date  . Allergy   . Anxiety   . Depression   . Fatty liver disease, nonalcoholic   . GERD (gastroesophageal reflux disease)   . Heart disease   . Hypertension   . Lung cancer ([place]) 02/2014   RUL Lobectomy  . Urinary incontinence     Past Surgical History:  Procedure Laterality Date  . ABDOMINAL HYSTERECTOMY    . LAPAROSCOPIC HYSTERECTOMY  1977  . LARYNGOSCOPY N/A 07/22/2014   Procedure: JET LARYNGOSCOPY with right vocal cord biopsy;  Surgeon: Margaretha Sheffield, MD;  Location: [facility] ORS;  Service: ENT;  Laterality: N/A;  . LUNG CANCER SURGERY      There were no vitals filed for this visit.  Subjective Assessment - 02/27/18 1443    Subjective  Pt. reports plasma shots are helpful and has had 3 in each hip so far. Pt. reports she is going every other week for the plasma shots.  Pt. reports no recent falls. Pt. States she has been compliant with HEP.     Limitations  Lifting;Standing;Walking;House hold activities    How long can you stand comfortably?  5 minutes    Currently in Pain?  No/denies    Pain Score  0-No pain    Pain Orientation  Right;Left    Pain Descriptors / Indicators  Aching    Pain Type  Chronic pain         Treatment  Therex tx:  Fwd walking in //-bars  Standing B hip abd in //-bars (VCs for proper form with slight UE assistance) 2x12 (one set with 2.5#) High marching in //-bars 2x12 (one set with 2.5#) Standing step taps to small step (min VCs for min UE assist in //-bars) TG squats with adduction ball, calf raises 2x12 each NuStep L3 21min (no charge)  Neuro tx:  Tandem walking on blue line in //-bars 5 laps (min VCs for two finger touch) Standing cone fwd reaching on high tray table, stacking cones B 3 times each (no UE assistance) Airrex balance with 2nd blue ball shld flexion and horizontal abd. 12x B Walking cone taps in agility ladder; 2 laps     PT Long Term Goals - 02/21/18 1740      PT LONG TERM GOAL #1   Title  Pt. independent with HEP to increase B hip strength to improve functional mobility/ independence with gait.      Baseline  B LE muscle strength grossly 5/5 MMT except hip flexion 4/5 MMT (slight increase c/o pain).      Time  4    Period  Weeks    Status  New    Target Date  03/06/18      PT LONG TERM GOAL #2   Title  Pt. will increase FOTO to 45 to improve functional mobility.      Baseline  Initial FOTO: 28    Time  4    Period  Weeks    Status  New    Target Date  03/06/18      PT LONG TERM GOAL #3   Title  Pt. will increase Berg balance test to >50/56 to improve safety/ independence with walking.      Baseline  Berg: 45/56    Time  4    Period  Weeks    Status  New    Target Date  03/06/18      PT LONG TERM GOAL #4   Title  Pt. able to ascend/ descend stairs with recip. pattern and no UE assist safely to improve independence/ mobility.      Baseline  Heavy use of B UE assist on handrails with stair climbing.      Time  4    Period  Weeks    Status  New    Target Date  03/06/18         Plan - 02/27/18 1516    Clinical Impression Statement  Pt. ambulates into clinic with decreased heel strike and knee extension and forward posture.  Pt. completed all therapeutic activities with minimal CGA from PT and minimal VCs during balance exercises. Pt. demonstrated some dificulty intially with balance on airex but improved with repeated repatitions. Pt. was also able to ballance on airex for 20 sec with no UE assist. Pt. will benefit from skilled PT services in order to improve balance strategies and increase overall B LE muscle strength for safe gait and balance during ADLs.    Clinical Presentation  Evolving    Clinical Decision Making  Moderate    Rehab Potential  Fair    PT Frequency  2x / week    PT Duration  4 weeks    PT Treatment/Interventions  ADLs/Self Care Home Management;Moist Heat;Gait training;Stair training;Functional mobility training;Neuromuscular re-education;Balance training;Therapeutic exercise;Therapeutic activities;Patient/family education;Manual techniques;Passive range of motion    PT Next Visit Plan  Progression of balance activities and B LE strengthening    PT Home Exercise Plan  See HEP       Patient will benefit from skilled therapeutic intervention in order to improve the following deficits and impairments:  Abnormal gait, Improper body mechanics, Pain, Postural dysfunction, Decreased activity tolerance, Decreased endurance, Decreased range of motion, Decreased strength, Impaired flexibility, Difficulty walking, Decreased balance  Visit Diagnosis: Gait difficulty  Recurrent falls  Muscle weakness (generalized)      Problem List Patient Active Problem List   Diagnosis Date Noted  . Cystocele, midline 04/28/2017  . Stress incontinence of urine 10/15/2014  . Polypharmacy 09/02/2014  . Primary cancer of right upper lobe of lung ([place]) 07/19/2014  . Acid reflux 07/18/2014  . 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 Chronic kidney disease (CKD), stage III (moderate) ([place]) 08/30/2013  . Benign essential HTN 08/30/2013  . Fatty liver disease, nonalcoholic  27/04/5007  . Fibrositis 08/30/2013  . Hot flash, menopausal 08/30/2013  . Billowing mitral valve 08/30/2013  . Allergic rhinitis, seasonal 08/30/2013  . Fibromyalgia 08/30/2013   Pura Spice, PT, DPT # [id number] Florentina Jenny, SPT 02/27/2018, 4:08 PM  [place] [facility] [address] [place],  [place], [address] Phone: [phone]   Fax:  [phone]  Name: Christy [name] MRN: [id number] Date of Birth: 11/13/1940

## 2018-03-01 ENCOUNTER — Ambulatory Visit: Payer: Medicare Other | Admitting: Oncology

## 2018-03-01 ENCOUNTER — Other Ambulatory Visit: Payer: Self-pay

## 2018-03-01 ENCOUNTER — Inpatient Hospital Stay: Payer: Medicare Other | Attending: Oncology | Admitting: Oncology

## 2018-03-01 VITALS — BP 133/67 | HR 76 | Temp 97.3°F | Ht 65.5 in | Wt 158.1 lb

## 2018-03-01 DIAGNOSIS — Z87891 Personal history of nicotine dependence: Secondary | ICD-10-CM | POA: Diagnosis not present

## 2018-03-01 DIAGNOSIS — C3411 Malignant neoplasm of upper lobe, right bronchus or lung: Secondary | ICD-10-CM | POA: Insufficient documentation

## 2018-03-01 DIAGNOSIS — I10 Essential (primary) hypertension: Secondary | ICD-10-CM | POA: Diagnosis not present

## 2018-03-01 NOTE — Progress Notes (Signed)
Patient is here to follow up on her primary cancer of right upper lobe of lung. Patient stated that she had been constipated but takes laxatives when Miralax and Metamucil doesn't help. Patient denied SOB or fatigued.

## 2018-03-01 NOTE — Progress Notes (Signed)
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  INTERVAL HISTORY: Patient returns to clinic today for further evaluation and discussion of her imaging results.  She continues to feel well and remains asymptomatic.  She has no neurologic complaints.  She denies any recent fevers or illnesses.  She has a good appetite and denies weight loss. She has no chest pain, shortness of breath, cough, or hemoptysis. She denies any nausea, vomiting, constipation, or diarrhea. She has no urinary complaints.  Patient feels at her baseline offers no specific complaints today.  REVIEW OF SYSTEMS:   Review of Systems  Constitutional: Negative.  Negative for fever, malaise/fatigue and weight loss.  Respiratory: Negative.  Negative for cough, hemoptysis and shortness of breath.   Cardiovascular: Negative.  Negative for chest pain and leg swelling.  Gastrointestinal: Negative.  Negative for abdominal pain.  Genitourinary: Negative.  Negative for dysuria.  Musculoskeletal: Negative.  Negative for back pain.  Skin: Negative.  Negative for rash.  Neurological: Negative.  Negative for sensory change, focal weakness and weakness.  Psychiatric/Behavioral: Negative.  The patient is not nervous/anxious.     As per HPI. Otherwise, a complete review of systems is negative.  PAST MEDICAL HISTORY: Past Medical History:  Diagnosis Date  . Allergy   . Anxiety   . Depression   . Fatty liver disease, nonalcoholic   . GERD (gastroesophageal reflux disease)   . Heart disease   . Hypertension   . Lung cancer ([place]) 02/2014   RUL Lobectomy  . Urinary incontinence     PAST SURGICAL HISTORY: Past Surgical History:  Procedure Laterality Date  . ABDOMINAL HYSTERECTOMY      . LAPAROSCOPIC HYSTERECTOMY  1977  . LARYNGOSCOPY N/A 07/22/2014   Procedure: JET LARYNGOSCOPY with right vocal cord biopsy;  Surgeon: Margaretha Sheffield, MD;  Location: [facility] ORS;  Service: ENT;  Laterality: N/A;  . LUNG CANCER SURGERY      FAMILY HISTORY Family History  Problem Relation Age of Onset  . Kidney cancer Cousin   . Cancer Maternal Grandfather   . Cancer Maternal Grandmother   . Kidney disease Neg Hx   . Prostate cancer Neg Hx        ADVANCED DIRECTIVES:    HEALTH MAINTENANCE: Social History   Tobacco Use  . Smoking status: Former Smoker    Packs/day: 0.50    Years: 25.00    Pack years: 12.50    Types: Cigarettes    Last attempt to quit: 07/18/2006    Years since quitting: 11.6  . Smokeless tobacco: Never Used  Substance Use Topics  . Alcohol use: Yes    Alcohol/week: 2.0 standard drinks    Types: 1 Glasses of wine, 1 Cans of beer per week    Comment: occassionally about 4 times a year  . Drug use: No     Allergies  Allergen Reactions  . Alendronate Other (See Comments)    Esophageal burning  . Sulfa Antibiotics Other (See Comments)  . Ciprofloxacin Other (See Comments)    Other Reaction: GI UPSET  . Diphenhydramine Other (See Comments)    Current Outpatient Medications  Medication Sig Dispense Refill  . Azelastine HCl 0.15 % SOLN Place 1-2 sprays into the nose 2 (two) times daily. 30 mL 0  .  benazepril (LOTENSIN) 5 MG tablet Take 1 tablet by mouth daily.    . calcium citrate-vitamin D 500-400 MG-UNIT chewable tablet Chew 1 tablet by mouth daily.    . cetirizine (ZYRTEC) 10 MG tablet Take 10 mg by mouth daily.    . Cholecalciferol (D 2000) 2000 UNITS TABS Take 1 tablet by mouth daily.    Marland Kitchen conjugated estrogens (PREMARIN) vaginal cream Place 1 Applicatorful vaginally daily. Apply 0.5mg  (pea-sized amount)  just inside the vaginal introitus with a finger-tip on  Monday, Wednesday and Friday nights. 30 g 12  . estradiol (ESTRACE) 1 MG tablet     . Multiple  Vitamins-Minerals (OCUVITE ADULT 50+ PO) Take 1 tablet by mouth daily.    Marland Kitchen omeprazole (PRILOSEC) 40 MG capsule     . psyllium (METAMUCIL) 0.52 g capsule Take by mouth.    . vitamin B-12 (CYANOCOBALAMIN) 1000 MCG tablet Take 1,000 mcg by mouth daily.     No current facility-administered medications for this visit.     OBJECTIVE: Vitals:   03/01/18 1038  BP: 133/67  Pulse: 76  Temp: (!) 97.3 F (36.3 C)     Body mass index is 25.91 kg/m.    ECOG FS:0 - Asymptomatic  General: Well-developed, well-nourished, no acute distress. Eyes: Pink conjunctiva, anicteric sclera. HEENT: Normocephalic, moist mucous membranes. Lungs: Clear to auscultation bilaterally. Heart: Regular rate and rhythm. No rubs, murmurs, or gallops. Abdomen: Soft, nontender, nondistended. No organomegaly noted, normoactive bowel sounds. Musculoskeletal: No edema, cyanosis, or clubbing. Neuro: Alert, answering all questions appropriately. Cranial nerves grossly intact. Skin: No rashes or petechiae noted. Psych: Normal affect.  LAB RESULTS:  Lab Results  Component Value Date   NA 137 12/05/2016   K 3.8 12/05/2016   CL 100 (L) 12/05/2016   CO2 28 12/05/2016   GLUCOSE 100 (H) 12/05/2016   BUN 20 05/13/2017   CREATININE 1.00 02/24/2018   CALCIUM 9.0 12/05/2016   PROT 7.7 11/24/2016   ALBUMIN 3.6 11/24/2016   AST 23 11/24/2016   ALT 14 11/24/2016   ALKPHOS 71 11/24/2016   BILITOT 0.6 11/24/2016   GFRNONAA >60 05/13/2017   GFRAA >60 05/13/2017    Lab Results  Component Value Date   WBC 13.9 (H) 12/05/2016   NEUTROABS 11.4 (H) 12/05/2016   HGB 13.6 12/05/2016   HCT 40.7 12/05/2016   MCV 91.5 12/05/2016   PLT 237 12/05/2016     STUDIES: Ct Chest W Contrast  Result Date: 02/24/2018 CLINICAL DATA:  78 year old female with history of right upper lobectomy in 2016 for lung cancer. Currently asymptomatic. EXAM: CT CHEST WITH CONTRAST TECHNIQUE: Multidetector CT imaging of the chest was performed during  intravenous contrast administration. CONTRAST:  85mL ISOVUE-300 IOPAMIDOL (ISOVUE-300) INJECTION 61% COMPARISON:  Chest CT 08/17/2017. FINDINGS: Cardiovascular: Heart size is normal. There is no significant pericardial fluid, thickening or pericardial calcification. There is aortic atherosclerosis, as well as atherosclerosis of the great vessels of the mediastinum and the coronary arteries, including calcified atherosclerotic plaque in the left anterior descending and right coronary arteries. Calcifications of the inferior aspect of the mitral annulus. Mediastinum/Nodes: No pathologically enlarged mediastinal or hilar lymph nodes. Esophagus is unremarkable in appearance. No axillary lymphadenopathy. Lungs/Pleura: Status post right upper lobectomy. Compensatory hyperexpansion of the right middle and lower lobes. Further contraction of area of area of scarring in the periphery of the right lower lobe (axial image 110 of series 3). Multiple small pulmonary nodules scattered throughout the lungs bilaterally measuring 5 mm or less in size,  stable in size and number compared to the prior studies dating back to at least 05/27/2015, considered definitively benign. No other suspicious appearing pulmonary nodules or masses are noted. No acute consolidative airspace disease. No pleural effusions. Upper Abdomen: Aortic atherosclerosis. Musculoskeletal: Bilateral subpectoral breast implants are incidentally noted. Postthoracotomy changes in the right hemithorax redemonstrated. There are no aggressive appearing lytic or blastic lesions noted in the visualized portions of the skeleton. IMPRESSION: 1. Status post right upper lobectomy. No findings to suggest local recurrence of disease or metastatic disease in the thorax. 2. Aortic atherosclerosis, in addition to 2 vessel coronary artery disease. Assessment for potential risk factor modification, dietary therapy or pharmacologic therapy may be warranted, if clinically indicated. 3.  There are calcifications of the mitral annulus. Echocardiographic correlation for evaluation of potential valvular dysfunction may be warranted if clinically indicated. Aortic Atherosclerosis (ICD10-I70.0). Electronically Signed   By: Vinnie Langton M.D.   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  PLAN:     1. 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 Pathology and surgical report reviewed independently.  Patient's most recent CT scan from February 24, 2018 reviewed independently and reported as above with no obvious evidence of recurrent or metastatic disease.  No intervention is needed at this time.  Return to clinic in 1 year with repeat imaging and further evaluation.   2. Hypertension: Patient's blood pressure is within normal limits today.  I spent a total of 20 minutes face-to-face with the patient of which greater than 50% of the visit was spent in counseling and coordination of care as detailed above.  Patient expressed understanding and was in agreement with this plan. She also understands that She can call clinic at any time with any questions, concerns, or complaints.    Lloyd Huger, MD   03/03/2018 11:59 AM

## 2018-03-02 ENCOUNTER — Encounter: Payer: Self-pay | Admitting: Physical Therapy

## 2018-03-02 ENCOUNTER — Ambulatory Visit: Payer: Medicare Other | Admitting: Physical Therapy

## 2018-03-02 ENCOUNTER — Inpatient Hospital Stay: Payer: Medicare Other | Admitting: Oncology

## 2018-03-02 DIAGNOSIS — R269 Unspecified abnormalities of gait and mobility: Secondary | ICD-10-CM | POA: Diagnosis not present

## 2018-03-02 DIAGNOSIS — M6281 Muscle weakness (generalized): Secondary | ICD-10-CM

## 2018-03-02 DIAGNOSIS — R296 Repeated falls: Secondary | ICD-10-CM

## 2018-03-02 NOTE — Therapy (Signed)
[place] [facility] [address] [place], [place], [address] Phone: [phone]   Fax:  [phone]  Physical Therapy Treatment  Patient Details  Name: Christy [name] MRN: [id number] Date of Birth: 1940-11-17 Referring Provider (PT): Dr. Raechel Ache   Encounter Date: 03/02/2018  PT End of Session - 03/02/18 1640    Visit Number  3    Number of Visits  8    Date for PT Re-Evaluation  03/06/18    PT Start Time  2353    PT Stop Time  1519    PT Time Calculation (min)  52 min    Equipment Utilized During Treatment  Gait belt    Activity Tolerance  Patient tolerated treatment well;No increased pain    Behavior During Therapy  WFL for tasks assessed/performed       Past Medical History:  Diagnosis Date  . Allergy   . Anxiety   . Depression   . Fatty liver disease, nonalcoholic   . GERD (gastroesophageal reflux disease)   . Heart disease   . Hypertension   . Lung cancer ([place]) 02/2014   RUL Lobectomy  . Urinary incontinence     Past Surgical History:  Procedure Laterality Date  . ABDOMINAL HYSTERECTOMY    . LAPAROSCOPIC HYSTERECTOMY  1977  . LARYNGOSCOPY N/A 07/22/2014   Procedure: JET LARYNGOSCOPY with right vocal cord biopsy;  Surgeon: Margaretha Sheffield, MD;  Location: [facility] ORS;  Service: ENT;  Laterality: N/A;  . LUNG CANCER SURGERY      There were no vitals filed for this visit.  Subjective Assessment - 03/02/18 1639    Subjective  Pt. reports no new falls since we saw her last. She c/o B groin pain and L hip pain today.    Limitations  Lifting;Standing;Walking;House hold activities    How long can you stand comfortably?  5 minutes    Patient Stated Goals  Increase B LE muscle strength/ improve balance and independence with gait.      Currently in Pain?  Yes   no subjective number   Pain Score  --        Treatment  Therex tx:  NuStep L 5 10 min (warm up) Fwd/bkwd walking in //-bars  Standing B hip abd, flexion, ext.  in //-bars 2.5# 2x15 High marching in //-bars 2x15 2.5# Walking over 3 inch blocks in //-bars 4 laps TG squats with adduction ball 2x15 each Seated HS curls GTB 2x10   Neuro tx:  Airrex balance, narrow BOS EO and EC 2x30 sec holds Stair taps to 6inch steps 2x15 (VCs for reciprocal pattern)    PT Long Term Goals - 02/21/18 1740      PT LONG TERM GOAL #1   Title  Pt. independent with HEP to increase B hip strength to improve functional mobility/ independence with gait.      Baseline  B LE muscle strength grossly 5/5 MMT except hip flexion 4/5 MMT (slight increase c/o pain).      Time  4    Period  Weeks    Status  New    Target Date  03/06/18      PT LONG TERM GOAL #2   Title  Pt. will increase FOTO to 45 to improve functional mobility.      Baseline  Initial FOTO: 28    Time  4    Period  Weeks    Status  New    Target Date  03/06/18  PT LONG TERM GOAL #3   Title  Pt. will increase Berg balance test to >50/56 to improve safety/ independence with walking.      Baseline  Berg: 45/56    Time  4    Period  Weeks    Status  New    Target Date  03/06/18      PT LONG TERM GOAL #4   Title  Pt. able to ascend/ descend stairs with recip. pattern and no UE assist safely to improve independence/ mobility.      Baseline  Heavy use of B UE assist on handrails with stair climbing.      Time  4    Period  Weeks    Status  New    Target Date  03/06/18         Plan - 03/02/18 1640    Clinical Impression Statement  Pt. ambulates into clinic with a small stride length and decreased heel strike and PT cued pt. throughout tx to take bigger steps and increase heel strike when she is ambulating. Pt. was able to exaggerate heel strike in //-bars with high marching after VCs from PT and min. UE assist. Pt. demonstrates hesitation when weight shifting during a reciprocal pattern during stair taps and when walking over 3 inch blocks. Pt. was able to balance on airex with narrow BOS with  EC (min. CGA from PT). Pt. will continue to benefit from skilled PT services in order to increase B LE muscle strength and gait training to promote safe and independent functional mobility during ADLs and community ambulation.     Clinical Presentation  Evolving    Clinical Decision Making  Moderate    Rehab Potential  Fair    PT Frequency  2x / week    PT Duration  4 weeks    PT Treatment/Interventions  ADLs/Self Care Home Management;Moist Heat;Gait training;Stair training;Functional mobility training;Neuromuscular re-education;Balance training;Therapeutic exercise;Therapeutic activities;Patient/family education;Manual techniques;Passive range of motion    PT Next Visit Plan  RECERT, talk about POC, d/c at the next visit in February, continue balance progression    PT Home Exercise Plan  See HEP       Patient will benefit from skilled therapeutic intervention in order to improve the following deficits and impairments:  Abnormal gait, Improper body mechanics, Pain, Postural dysfunction, Decreased activity tolerance, Decreased endurance, Decreased range of motion, Decreased strength, Impaired flexibility, Difficulty walking, Decreased balance  Visit Diagnosis: Gait difficulty  Recurrent falls  Muscle weakness (generalized)     Problem List Patient Active Problem List   Diagnosis Date Noted  . Cystocele, midline 04/28/2017  . Stress incontinence of urine 10/15/2014  . Polypharmacy 09/02/2014  . Primary cancer of right upper lobe of lung ([place]) 07/19/2014  . Acid reflux 07/18/2014  . 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 Chronic kidney disease (CKD), stage III (moderate) ([place]) 08/30/2013  . Benign essential HTN 08/30/2013  . Fatty liver disease, nonalcoholic 09/60/4540  . Fibrositis 08/30/2013  . Hot flash, menopausal 08/30/2013  . Billowing mitral valve 08/30/2013  . Allergic rhinitis, seasonal 08/30/2013  . Fibromyalgia 08/30/2013    Pura Spice, PT, DPT # [id number] [place], SPT 03/03/2018, 8:53 AM  [place] [facility] [address] [place], [place], [address] Phone: [phone]   Fax:  [phone]  Name: Christy [name] MRN: [id number] Date of Birth: Sep 20, 1940

## 2018-03-06 ENCOUNTER — Ambulatory Visit: Payer: Medicare Other | Admitting: Physical Therapy

## 2018-03-06 ENCOUNTER — Encounter: Payer: Self-pay | Admitting: Physical Therapy

## 2018-03-06 VITALS — BP 128/69 | HR 87

## 2018-03-06 DIAGNOSIS — R269 Unspecified abnormalities of gait and mobility: Secondary | ICD-10-CM

## 2018-03-06 DIAGNOSIS — M6281 Muscle weakness (generalized): Secondary | ICD-10-CM

## 2018-03-06 DIAGNOSIS — R296 Repeated falls: Secondary | ICD-10-CM

## 2018-03-06 NOTE — Therapy (Signed)
[facility] [address] [place], [place], [address] Phone: [phone]   Fax:  [phone]  Physical Therapy Treatment  Patient Details  Name: Christy [name] MRN: [id number] Date of Birth: [birth year] Referring Provider (PT): Dr. Raechel Ache   Encounter Date: 03/06/2018  PT End of Session - 03/06/18 1310    Visit Number  4    Number of Visits  8    Date for PT Re-Evaluation  03/06/18    PT Start Time  7062    PT Stop Time  1345    PT Time Calculation (min)  47 min    Equipment Utilized During Treatment  Gait belt    Activity Tolerance  Patient tolerated treatment well;No increased pain    Behavior During Therapy  WFL for tasks assessed/performed       Past Medical History:  Diagnosis Date  . Allergy   . Anxiety   . Depression   . Fatty liver disease, nonalcoholic   . GERD (gastroesophageal reflux disease)   . Heart disease   . Hypertension   . Lung cancer ([place]) 02/2014   RUL Lobectomy  . Urinary incontinence     Past Surgical History:  Procedure Laterality Date  . ABDOMINAL HYSTERECTOMY    . LAPAROSCOPIC HYSTERECTOMY  1977  . LARYNGOSCOPY N/A 07/22/2014   Procedure: JET LARYNGOSCOPY with right vocal cord biopsy;  Surgeon: Margaretha Sheffield, MD;  Location: [facility] ORS;  Service: ENT;  Laterality: N/A;  . LUNG CANCER SURGERY      Vitals:   03/06/18 1314  BP: 128/69  Pulse: 87  SpO2: 99%    TREATMENT  Therapeutic Exercise: Step-ups, CGA, BLE, x10, CGA Standing hip flexion marches, 2# CW, BLE, x12 Sit<>stand, no UE support, x10. Patient uses a hip adduction strategy for stability when rising. CGA Partial squat with tactile feedback via raised seat, x10. CGA. Patient has increased hip adduction throughout.   Neuromuscular Re-education: // bars:  Airex WBOS, EO balance, CGA, no UE support, x 30 sec bout Airex WBOS, UE reaches, CGA, no UE support. Patient had one LOB, but was able to recover using an ankle and hip  strategy. 3x 6 RUE reach at varying heights and lateralities. Airex tandem stance, EO balance, 2 finger support BUE, BLE bias, x30 sec bouts  Gait Training:    Treatments unbilled:   Patient Response to interventions:    PT Long Term Goals - 02/21/18 1740      PT LONG TERM GOAL #1   Title  Pt. independent with HEP to increase B hip strength to improve functional mobility/ independence with gait.      Baseline  B LE muscle strength grossly 5/5 MMT except hip flexion 4/5 MMT (slight increase c/o pain).      Time  4    Period  Weeks    Status  New    Target Date  03/06/18      PT LONG TERM GOAL #2   Title  Pt. will increase FOTO to 45 to improve functional mobility.      Baseline  Initial FOTO: 28    Time  4    Period  Weeks    Status  New    Target Date  03/06/18      PT LONG TERM GOAL #3   Title  Pt. will increase Berg balance test to >50/56 to improve safety/ independence with walking.      Baseline  Berg: 45/56  Time  4    Period  Weeks    Status  New    Target Date  03/06/18      PT LONG TERM GOAL #4   Title  Pt. able to ascend/ descend stairs with recip. pattern and no UE assist safely to improve independence/ mobility.      Baseline  Heavy use of B UE assist on handrails with stair climbing.      Time  4    Period  Weeks    Status  New    Target Date  03/06/18              Patient will benefit from skilled therapeutic intervention in order to improve the following deficits and impairments:     Visit Diagnosis: Gait difficulty  Recurrent falls  Muscle weakness (generalized)     Problem List Patient Active Problem List   Diagnosis Date Noted  . Cystocele, midline 04/28/2017  . Stress incontinence of urine 10/15/2014  . Polypharmacy 09/02/2014  . Primary cancer of right upper lobe of lung ([place]) 07/19/2014  . Acid reflux 07/18/2014  . 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 Chronic kidney disease  (CKD), stage III (moderate) ([place]) 08/30/2013  . Benign essential HTN 08/30/2013  . Fatty liver disease, nonalcoholic 41/58/3094  . Fibrositis 08/30/2013  . Hot flash, menopausal 08/30/2013  . Billowing mitral valve 08/30/2013  . Allergic rhinitis, seasonal 08/30/2013  . Fibromyalgia 08/30/2013      Louie Casa 03/06/2018, 1:24 PM  [facility] [address] [place], [place], [address] Phone: [phone]   Fax:  [phone]  Name: Christy [name] MRN: [id number] Date of Birth: 1940/11/07

## 2018-03-06 NOTE — Therapy (Signed)
[facility] [address] [place], [place], [address] Phone: [phone]   Fax:  [phone]  Physical Therapy Treatment  Patient Details  Name: Christy [name] MRN: [id number] Date of Birth: 1940-09-24 Referring Provider (PT): Dr. Raechel Ache   Encounter Date: 03/06/2018  PT End of Session - 03/06/18 1511    Visit Number  4    Number of Visits  8    Date for PT Re-Evaluation  03/06/18    PT Start Time  5056    PT Stop Time  1352    PT Time Calculation (min)  54 min    Equipment Utilized During Treatment  Gait belt    Activity Tolerance  Patient tolerated treatment well;No increased pain    Behavior During Therapy  WFL for tasks assessed/performed       Past Medical History:  Diagnosis Date  . Allergy   . Anxiety   . Depression   . Fatty liver disease, nonalcoholic   . GERD (gastroesophageal reflux disease)   . Heart disease   . Hypertension   . Lung cancer ([place]) 02/2014   RUL Lobectomy  . Urinary incontinence     Past Surgical History:  Procedure Laterality Date  . ABDOMINAL HYSTERECTOMY    . LAPAROSCOPIC HYSTERECTOMY  1977  . LARYNGOSCOPY N/A 07/22/2014   Procedure: JET LARYNGOSCOPY with right vocal cord biopsy;  Surgeon: Margaretha Sheffield, MD;  Location: [facility] ORS;  Service: ENT;  Laterality: N/A;  . LUNG CANCER SURGERY      Vitals:   03/06/18 1314  BP: 128/69  Pulse: 87  SpO2: 99%   Subjective Assessment - 03/06/18 1502    Subjective  Pt. entered clinic ambulating independant of assistive device. Pt. reports almost falling during the weekend but was able to catch herself. She reported doing her HEP without dificulty. Pt. noted phases of feeling like her head has been spinning and feeling dizzy some lately.     Limitations  Lifting;Standing;Walking;House hold activities    How long can you stand comfortably?  5 minutes    Patient Stated Goals  Increase B LE muscle strength/ improve balance and independence with gait.       Currently in Pain?  No/denies      TREATMENT  Therapeutic Exercise: Step-ups, CGA, BLE, x10, CGA. Patient reported dizziness. Patient was provided a seat and quiet rest for approximately 5 minutes. Vitals assessed (128/69 bpm, 87 bpm, 99% SpO2) Resisted monster walks RTB forward and backward 4 laps  Standing hip flexion marches, 2# CW, BLE, x12 Sit<>stand, no UE support, x10. Patient uses a hip adduction strategy for stability when rising. CGA Partial squat with tactile feedback via raised seat, x10. CGA. Patient has increased hip adduction throughout.   Neuromuscular Re-education: //-bars Airex WBOS, EO balance, CGA, no UE support, x 30 sec bout Airex WBOS, UE reaches, CGA, no UE support. Patient had one LOB, but was able to recover using an ankle and hip strategy. 3x 6 RUE reach at varying heights and lateralities. Airex tandem stance, EO balance, 2 finger support BUE, BLE bias, x30 sec bouts  Gait Training: SPC, 2 point gait with VCs throughout, SBA. x30 feet   Treatments unbilled: SciFit x8 min, L4  Patient Response to interventions: Patient denied symptoms of dizziness or confusion during the remainder of the session. Patient denied increased pain and reported feeling good at the end of the session.   PT Long Term Goals - 02/21/18 1740  PT LONG TERM GOAL #1   Title  Pt. independent with HEP to increase B hip strength to improve functional mobility/ independence with gait.      Baseline  B LE muscle strength grossly 5/5 MMT except hip flexion 4/5 MMT (slight increase c/o pain).      Time  4    Period  Weeks    Status  New    Target Date  03/06/18      PT LONG TERM GOAL #2   Title  Pt. will increase FOTO to 45 to improve functional mobility.      Baseline  Initial FOTO: 28    Time  4    Period  Weeks    Status  New    Target Date  03/06/18      PT LONG TERM GOAL #3   Title  Pt. will increase Berg balance test to >50/56 to improve safety/ independence with  walking.      Baseline  Berg: 45/56    Time  4    Period  Weeks    Status  New    Target Date  03/06/18      PT LONG TERM GOAL #4   Title  Pt. able to ascend/ descend stairs with recip. pattern and no UE assist safely to improve independence/ mobility.      Baseline  Heavy use of B UE assist on handrails with stair climbing.      Time  4    Period  Weeks    Status  New    Target Date  03/06/18            Plan - 03/06/18 1339    Clinical Impression Statement Pt. ambulates into clinic with shortened step lengthe and decreased heel strike. Pt. performed monster walks with some dificulty due to feeling unsteady. PT min cueing for upright posture and proper heel strike during resisted monster walks. Pt. started to feel dizzy during step ups on 6 in step. PT assess vitals, all WNL. PT continued to monitor during visit. Pt. needed mod cueing during sit to stand for correct squat form to maintain upright back and shoudler posture thoughout execercise. Pt. demonstrated dificulty with tandom stance on airex, requireing mod assitance from PT to maintain balance. Pt. will continue to benefit from skilled PT to improve balance and decrease fall risk.     Rehab Potential  Fair    PT Frequency  2x / week    PT Duration  4 weeks    PT Treatment/Interventions  ADLs/Self Care Home Management;Moist Heat;Gait training;Stair training;Functional mobility training;Neuromuscular re-education;Balance training;Therapeutic exercise;Therapeutic activities;Patient/family education;Manual techniques;Passive range of motion    PT Next Visit Plan  RECERT/POC discussion, progress balance    PT Home Exercise Plan  See HEP       Patient will benefit from skilled therapeutic intervention in order to improve the following deficits and impairments:  Abnormal gait, Improper body mechanics, Pain, Postural dysfunction, Decreased activity tolerance, Decreased endurance, Decreased range of motion, Decreased strength, Impaired  flexibility, Difficulty walking, Decreased balance  Visit Diagnosis: Gait difficulty  Recurrent falls  Muscle weakness (generalized)     Problem List Patient Active Problem List   Diagnosis Date Noted  . Cystocele, midline 04/28/2017  . Stress incontinence of urine 10/15/2014  . Polypharmacy 09/02/2014  . Primary cancer of right upper lobe of lung ([place]) 07/19/2014  . Acid reflux 07/18/2014  . 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 Chronic  kidney disease (CKD), stage III (moderate) ([place]) 08/30/2013  . Benign essential HTN 08/30/2013  . Fatty liver disease, nonalcoholic 17/00/1749  . Fibrositis 08/30/2013  . Hot flash, menopausal 08/30/2013  . Billowing mitral valve 08/30/2013  . Allergic rhinitis, seasonal 08/30/2013  . Fibromyalgia 08/30/2013      Florentina Jenny, SPT   This entire session was performed under direct supervision and direction of a licensed therapist/therapist assistant . I have personally read, edited and approve of the note as written.  Myles Gip PT, DPT [phone] 03/07/2018, 7:16 PM  [facility] [address] [place], [place], [address] Phone: [phone]   Fax:  [phone]  Name: Christy [name] MRN: [id number] Date of Birth: 1940-08-19

## 2018-03-15 ENCOUNTER — Encounter: Payer: Self-pay | Admitting: Physical Therapy

## 2018-03-15 ENCOUNTER — Ambulatory Visit: Payer: Medicare Other | Attending: Internal Medicine | Admitting: Physical Therapy

## 2018-03-15 DIAGNOSIS — M6281 Muscle weakness (generalized): Secondary | ICD-10-CM | POA: Insufficient documentation

## 2018-03-15 DIAGNOSIS — R269 Unspecified abnormalities of gait and mobility: Secondary | ICD-10-CM | POA: Diagnosis not present

## 2018-03-15 DIAGNOSIS — R296 Repeated falls: Secondary | ICD-10-CM | POA: Diagnosis present

## 2018-03-15 NOTE — Therapy (Signed)
[name] [address] [place], [place], [address] Phone: [phone]   Fax:  [phone]  Physical Therapy Treatment  Patient Details  Name: Christy [name] MRN: [id number] Date of Birth: [birth year] Referring Provider (PT): Dr. Raechel Ache   Encounter Date: 03/15/2018  PT End of Session - 03/15/18 1046    Visit Number  5    Number of Visits  7    Date for PT Re-Evaluation  05/10/18    PT Start Time  1594    PT Stop Time  1129    PT Time Calculation (min)  50 min    Equipment Utilized During Treatment  Gait belt    Activity Tolerance  Patient tolerated treatment well;No increased pain    Behavior During Therapy  WFL for tasks assessed/performed       Past Medical History:  Diagnosis Date  . Allergy   . Anxiety   . Depression   . Fatty liver disease, nonalcoholic   . GERD (gastroesophageal reflux disease)   . Heart disease   . Hypertension   . Lung cancer ([place]) 02/2014   RUL Lobectomy  . Urinary incontinence     Past Surgical History:  Procedure Laterality Date  . ABDOMINAL HYSTERECTOMY    . LAPAROSCOPIC HYSTERECTOMY  1977  . LARYNGOSCOPY N/A 07/22/2014   Procedure: JET LARYNGOSCOPY with right vocal cord biopsy;  Surgeon: Margaretha Sheffield, MD;  Location: [facility] ORS;  Service: ENT;  Laterality: N/A;  . LUNG CANCER SURGERY      There were no vitals filed for this visit.  Subjective Assessment - 03/15/18 1041    Subjective  Pt. reports she believes therapy has been helpful and no new complaints after last session. Pt. states that her HEP "tires her out in a good way" and states that she feels her balance has improved. Pt. states her back was aggravated this morning (5/10 NPS) because she cleaned the floors and then went to chiropractor. Pt. States that she is still avoiding stairs.   Limitations  Lifting;Standing;Walking;House hold activities    How long can you stand comfortably?  5 minutes    Patient Stated Goals  Increase  B LE muscle strength/ improve balance and independence with gait.      Currently in Pain?  No/denies    Pain Score  0-No pain        Treatment:  Neuro:  Berg Balance Assessment 48/56 (was able to complete all tasks with min CGA and no use of UE during sit to stand transfers)  Therex:  NuStep 10 min L5 Stair reassessment (1 time with B UE, 1 time with one UE assist and 2 times with no UE; able to use reciprocal pattern Reassessment of B LE MMT (B grossly 5/5 except hip flexion 4/5); no incr in sx Reviewed/ updated HEP to include GTB: hip abduction, extension, marches at counter, partial squats (tactile cues needed for proper mechanics and decr. Knee valgus), tandem balance stance    PT Long Term Goals - 03/15/18 1133      PT LONG TERM GOAL #1   Title  Pt. independent with HEP to increase B hip strength to improve functional mobility/ independence with gait.      Baseline  MMT reassessment 2/5: B LE grossly 5/5 MMT except hip flexion 4/5    Time  8    Period  Weeks    Status  Not Met    Target Date  05/10/18  PT LONG TERM GOAL #2   Title  Pt. will increase FOTO to 45 to improve functional mobility.      Baseline  Initial FOTO: 28    Time  8    Period  Weeks    Status  On-going    Target Date  05/10/18      PT LONG TERM GOAL #3   Title  Pt. will increase Berg balance test to >50/56 to improve safety/ independence with walking.      Baseline  Berg 2/5: 48/56    Time  8    Period  Weeks    Status  Partially Met    Target Date  05/10/18      PT LONG TERM GOAL #4   Title  Pt. able to ascend/ descend stairs with recip. pattern and no UE assist safely to improve independence/ mobility.      Baseline  Pt. was able to demonstrate reciprocal pattern using 1 UE and no UE assist with PT CGA when ascending and descending stairs.    Time  8    Period  Weeks    Status  Partially Met    Target Date  05/10/18        Pt. ambulates into clinic with decr. stride length, heel  strike and velocity. Pt. reports that she has a cane but does not use it. Pt. improved her Berg score form 45 to 48 and was able to complete all Berg tasks with little-no UE assist and min CGA from PT; no dizziness or incr. in sx reported. Pt. still demonstrates weakness in hip flexion (4/5 MMT) but B LE strength is grossly 5/5 elsewhere with no incr in sx. Pt. states that she is afraid of falling when using stairs and avoids them but pt. was able to demonstrate a reciprocal step pattern when ascending and descending stairs with one UE assist and no UE assist. Pt. also was confused on whether or not she has stairs in her house/ when she uses them (reports she has 4 stairs in garage, 1 in living room). Pt. overall feels that PT has helped her balance and strength and requested new HEP and wants to check in 1x/month on her progress. Pt. needed min verbal/ tactile cues for standing hip therex to maintain upright posture and decr. hip ROM in order to keep proper posture. Pt. also needed verbal/tactile cues with squats to decr. knee valgus and maintain upright trunk. Pt. updated HEP to include GTB with standing hip therex and educated pt. on the importance of continuning HEP and regular exercise to improve strength, balance and safe gait pattern.      Patient will benefit from skilled therapeutic intervention in order to improve the following deficits and impairments:  Abnormal gait, Improper body mechanics, Pain, Postural dysfunction, Decreased activity tolerance, Decreased endurance, Decreased range of motion, Decreased strength, Impaired flexibility, Difficulty walking, Decreased balance  Visit Diagnosis: Gait difficulty  Recurrent falls  Muscle weakness (generalized)     Problem List Patient Active Problem List   Diagnosis Date Noted  . Cystocele, midline 04/28/2017  . Stress incontinence of urine 10/15/2014  . Polypharmacy 09/02/2014  . Primary cancer of right upper lobe of lung ([place])  07/19/2014  . Acid reflux 07/18/2014  . 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 Chronic kidney disease (CKD), stage III (moderate) ([place]) 08/30/2013  . Benign essential HTN 08/30/2013  . Fatty liver disease, nonalcoholic 78/29/5621  . Fibrositis 08/30/2013  . Hot  flash, menopausal 08/30/2013  . Billowing mitral valve 08/30/2013  . Allergic rhinitis, seasonal 08/30/2013  . Fibromyalgia 08/30/2013   Pura Spice, PT, DPT # [id number] [place], SPT 03/15/2018, 1:50 PM  [facility] [address] [place], [place], [address] Phone: [phone]   Fax:  [phone]  Name: Taleia Sadowski MRN: [id number] Date of Birth: September 18, 1940

## 2018-03-28 DIAGNOSIS — G3184 Mild cognitive impairment, so stated: Secondary | ICD-10-CM | POA: Insufficient documentation

## 2018-04-06 ENCOUNTER — Ambulatory Visit
Admission: EM | Admit: 2018-04-06 | Discharge: 2018-04-06 | Disposition: A | Discharge status: Home / Self Care | Payer: Medicare Other | Attending: Family Medicine | Admitting: Family Medicine

## 2018-04-06 ENCOUNTER — Other Ambulatory Visit: Payer: Self-pay

## 2018-04-06 ENCOUNTER — Encounter: Payer: Self-pay | Admitting: Emergency Medicine

## 2018-04-06 DIAGNOSIS — H8110 Benign paroxysmal vertigo, unspecified ear: Secondary | ICD-10-CM

## 2018-04-06 MED ORDER — MECLIZINE HCL 25 MG PO TABS: 25.0000 mg | ORAL_TABLET | Freq: Three times a day (TID) | ORAL | 0 refills | Status: DC | PRN

## 2018-04-06 NOTE — Discharge Instructions (Signed)
Medication as needed.  PT will call and have you seen sooner.  Take the Rx with you.  Take care  Dr. Lacinda Axon

## 2018-04-06 NOTE — ED Triage Notes (Signed)
Pt c/o dizziness and blurred vision. She describes it as the room spinning. Dizziness is worse when she moves certain ways. Denies chest pain, SOB, numbness or headache.

## 2018-04-06 NOTE — ED Provider Notes (Signed)
[facility]    CSN: [id number] Arrival date & time: 04/06/18  8185  History   Chief Complaint Vertigo  HPI  78 year old female presents with vertigo.  Patient reports that she developed dizziness this morning.  Described as room spinning.  Worse with certain movements.  No associated nausea or vomiting.  Patient does note some vision changes.  Patient is currently undergoing physical therapy for vertigo as well as a gait instability.  No known inciting factor.  No known relieving factors.  No other associated symptoms.  No other complaints  Past Medical History:  Diagnosis Date  . Allergy   . Anxiety   . Depression   . Fatty liver disease, nonalcoholic   . GERD (gastroesophageal reflux disease)   . Heart disease   . Hypertension   . Lung cancer ([place]) 02/2014   RUL Lobectomy  . Urinary incontinence    Patient Active Problem List   Diagnosis Date Noted  . Cystocele, midline 04/28/2017  . Stress incontinence of urine 10/15/2014  . Polypharmacy 09/02/2014  . Primary cancer of right upper lobe of lung ([place]) 07/19/2014  . Acid reflux 07/18/2014  . 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 Chronic kidney disease (CKD), stage III (moderate) ([place]) 08/30/2013  . Benign essential HTN 08/30/2013  . Fatty liver disease, nonalcoholic 63/14/9702  . Fibrositis 08/30/2013  . Hot flash, menopausal 08/30/2013  . Billowing mitral valve 08/30/2013  . Allergic rhinitis, seasonal 08/30/2013  . Fibromyalgia 08/30/2013   Past Surgical History:  Procedure Laterality Date  . ABDOMINAL HYSTERECTOMY    . LAPAROSCOPIC HYSTERECTOMY  1977  . LARYNGOSCOPY N/A 07/22/2014   Procedure: JET LARYNGOSCOPY with right vocal cord biopsy;  Surgeon: Margaretha Sheffield, MD;  Location: [facility] ORS;  Service: ENT;  Laterality: N/A;  . LUNG CANCER SURGERY      OB History   No obstetric history on file.      Home Medications    Prior to Admission medications     Medication Sig Start Date End Date Taking? Authorizing Provider  Azelastine HCl 0.15 % SOLN Place 1-2 sprays into the nose 2 (two) times daily. 01/14/16 04/06/18 Yes Betancourt, Aura Fey, NP  benazepril (LOTENSIN) 5 MG tablet Take 1 tablet by mouth daily. 03/04/14  Yes [provider]  calcium citrate-vitamin D 500-400 MG-UNIT chewable tablet Chew 1 tablet by mouth daily.   Yes [provider]  cetirizine (ZYRTEC) 10 MG tablet Take 10 mg by mouth daily.   Yes [provider]  estradiol (ESTRACE) 1 MG tablet  04/29/17  Yes [provider]  Multiple Vitamin (MULTIVITAMIN) tablet Take 1 tablet by mouth daily.   Yes [provider]  Multiple Vitamins-Minerals (OCUVITE ADULT 50+ PO) Take 1 tablet by mouth daily.   Yes [provider]  omeprazole (PRILOSEC) 40 MG capsule  09/23/17  Yes [provider]  psyllium (METAMUCIL) 0.52 g capsule Take by mouth.   Yes [provider]  vitamin B-12 (CYANOCOBALAMIN) 1000 MCG tablet Take 1,000 mcg by mouth daily.   Yes [provider]  meclizine (ANTIVERT) 25 MG tablet Take 1 tablet (25 mg total) by mouth 3 (three) times daily as needed for dizziness. 04/06/18   Coral Spikes, DO    Family History Family History  Problem Relation Age of Onset  . Kidney cancer Cousin   . Cancer Maternal Grandfather   . Cancer Maternal Grandmother   . Kidney disease Neg Hx   .  Prostate cancer Neg Hx     Social History Social History   Tobacco Use  . Smoking status: Former Smoker    Packs/day: 0.50    Years: 25.00    Pack years: 12.50    Types: Cigarettes    Last attempt to quit: 07/18/2006    Years since quitting: 11.7  . Smokeless tobacco: Never Used  Substance Use Topics  . Alcohol use: Yes    Alcohol/week: 2.0 standard drinks    Types: 1 Glasses of wine, 1 Cans of beer per week    Comment: occassionally about 4 times a year  . Drug use: No     Allergies   Alendronate; Sulfa  antibiotics; Ciprofloxacin; and Diphenhydramine   Review of Systems Review of Systems  Eyes: Positive for visual disturbance.  Neurological: Positive for dizziness.   Physical Exam Triage Vital Signs ED Triage Vitals  Enc Vitals Group     BP 04/06/18 0918 109/64     Pulse Rate 04/06/18 0918 79     Resp 04/06/18 0918 18     Temp 04/06/18 0918 97.9 F (36.6 C)     Temp Source 04/06/18 0918 Oral     SpO2 04/06/18 0918 100 %     Weight 04/06/18 0914 160 lb (72.6 kg)     Height 04/06/18 0914 5' 5.5" (1.664 m)     Head Circumference --      Peak Flow --      Pain Score 04/06/18 0914 2     Pain Loc --      Pain Edu? --      Excl. in [place]? --    Updated Vital Signs BP 109/64 (BP Location: Left Arm)   Pulse 79   Temp 97.9 F (36.6 C) (Oral)   Resp 18   Ht 5' 5.5" (1.664 m)   Wt 72.6 kg   SpO2 100%   BMI 26.22 kg/m   Visual Acuity Right Eye Distance:   Left Eye Distance:   Bilateral Distance:    Right Eye Near:   Left Eye Near:    Bilateral Near:     Physical Exam Vitals signs and nursing note reviewed.  Constitutional:      General: She is not in acute distress.    Appearance: Normal appearance.  HENT:     Head: Normocephalic and atraumatic.     Nose: Nose normal.  Eyes:     General:        Right eye: No discharge.        Left eye: No discharge.     Conjunctiva/sclera: Conjunctivae normal.  Cardiovascular:     Rate and Rhythm: Normal rate and regular rhythm.  Pulmonary:     Effort: Pulmonary effort is normal.     Breath sounds: Normal breath sounds.  Neurological:     Mental Status: She is alert.  Psychiatric:        Mood and Affect: Mood normal.        Behavior: Behavior normal.    UC Treatments / Results  Labs (all labs ordered are listed, but only abnormal results are displayed) Labs Reviewed - No data to display  EKG None  Radiology No results found.  Procedures Procedures (including critical care time)  Medications Ordered in  UC Medications - No data to display  Initial Impression / Assessment and Plan / UC Course  I have reviewed the triage vital signs and the nursing notes.  Pertinent labs & imaging results that were  available during my care of the patient were reviewed by me and considered in my medical decision making (see chart for details).    78 year old female presents with vertigo.  Arranging for vestibular rehab.  Meclizine as needed.  Final Clinical Impressions(s) / UC Diagnoses   Final diagnoses:  Benign paroxysmal positional vertigo, unspecified laterality     Discharge Instructions     Medication as needed.  PT will call and have you seen sooner.  Take the Rx with you.  Take care  Dr. Lacinda Axon    ED Prescriptions    Medication Sig Dispense Auth. Provider   meclizine (ANTIVERT) 25 MG tablet Take 1 tablet (25 mg total) by mouth 3 (three) times daily as needed for dizziness. 30 tablet Coral Spikes, DO     Controlled Substance Prescriptions [place] Controlled Substance Registry consulted? Not Applicable   Coral Spikes, [place] 04/06/18 2800

## 2018-04-13 ENCOUNTER — Ambulatory Visit: Payer: Medicare Other | Admitting: Physical Therapy

## 2018-04-20 ENCOUNTER — Other Ambulatory Visit: Payer: Self-pay

## 2018-04-20 ENCOUNTER — Ambulatory Visit: Payer: Medicare Other | Attending: Family Medicine | Admitting: Physical Therapy

## 2018-04-20 ENCOUNTER — Encounter: Payer: Self-pay | Admitting: Physical Therapy

## 2018-04-20 DIAGNOSIS — R296 Repeated falls: Secondary | ICD-10-CM | POA: Diagnosis present

## 2018-04-20 DIAGNOSIS — R42 Dizziness and giddiness: Secondary | ICD-10-CM | POA: Diagnosis present

## 2018-04-20 DIAGNOSIS — R269 Unspecified abnormalities of gait and mobility: Secondary | ICD-10-CM | POA: Diagnosis not present

## 2018-04-20 NOTE — Therapy (Signed)
[facility] [address] [place], [place], [address] Phone: [phone]   Fax:  [phone]  Physical Therapy Treatment  Patient Details  Name: Christy [name] MRN: [id number] Date of Birth: 05-22-1940 Referring Provider (PT): Dr. Raechel Ache   Encounter Date: 04/20/2018  PT End of Session - 04/20/18 1353    Visit Number  6    Number of Visits  7    Date for PT Re-Evaluation  05/10/18    PT Start Time  3559    PT Stop Time  1449    PT Time Calculation (min)  56 min    Equipment Utilized During Treatment  --    Activity Tolerance  Patient tolerated treatment well    Behavior During Therapy  [facility] for tasks assessed/performed       Past Medical History:  Diagnosis Date  . Allergy   . Anxiety   . Depression   . Fatty liver disease, nonalcoholic   . GERD (gastroesophageal reflux disease)   . Heart disease   . Hypertension   . Lung cancer ([place]) 02/2014   RUL Lobectomy  . Urinary incontinence     Past Surgical History:  Procedure Laterality Date  . ABDOMINAL HYSTERECTOMY    . LAPAROSCOPIC HYSTERECTOMY  1977  . LARYNGOSCOPY N/A 07/22/2014   Procedure: JET LARYNGOSCOPY with right vocal cord biopsy;  Surgeon: Margaretha Sheffield, MD;  Location: [facility] ORS;  Service: ENT;  Laterality: N/A;  . LUNG CANCER SURGERY      There were no vitals filed for this visit.  Subjective Assessment - 04/21/18 1426    Limitations  Lifting;Standing;Walking;House hold activities    How long can you stand comfortably?  5 minutes    Patient Stated Goals  Increase B LE muscle strength/ improve balance and independence with gait.      Currently in Pain?  No/denies       VESTIBULAR AND BALANCE EVALUATION   HISTORY:  Subjective history of current problem: Patient states a week ago she had an episode where she got up and had a spell of vertigo and fell back on the bed. Patient states she had to hold on to walk anywhere. Patient states she went to the ED on  04/06/18 due to this spell of vertigo. Patient states she has not had any more episodes but states she "felt a little weird a few times" when she went to get up. Patient states she has been slowing down her movements since this episode. Patient states she took two of the Meclizine, but states she quit taking this as it made her very drowsy.   Description of dizziness: vertigo, unsteadiness, lightheadedness, falling Frequency: patient reports the only episode of vertigo occurred on 04/06/18 Duration: few minutes Symptom nature: motion provoked, positional  Provocative Factors: patient states she does not know what brings on her symptoms Easing Factors: staying still   Progression of symptoms: little better History of similar episodes: no  Falls (yes/no): yes Number of falls in past 6 months: 7  Auditory complaints (tinnitus, pain, drainage): tinnitus chronic for years; wears hearing aides Vision (last eye exam, diplopia, recent changes): a little blurred vision at times     EXAMINATION  Posture: Rounded shoulders, forward head  MUSCULOSKELETAL SCREEN: Cervical Spine ROM: Patient denies neck pain at rest. AROM cervical neck flexion, extension, right and left rotation.   Transfers: Patient able to perform sit to/from stand but utilizes UEs to assist and pushes legs against chair to  rise.   Gait: Patient arrives ambulating without AD. Patient ambulates with uneven steppage, decreased gait speed, decreased arm swing, step length and step height. Scanning of visual environment with gait is: fair  Balance: Patient ambulated with horizontal and vertical head turns with minor change in gait speed but denies dizziness.   OCULOMOTOR / VESTIBULAR TESTING:  Oculomotor Exam- Room Light  Normal Abnormal Comments  Ocular Alignment N    Ocular ROM N    Spontaneous Nystagmus N    Gaze evoked Nystagmus N    Smooth Pursuit N    Saccades  Abn Very mild hypometric saccades left and right  fields  VOR N  VOR X 2 for 30 seconds and 2 reps of 1 minute  VOR Cancellation N  Denies dizziness or blurring of target  Left Head Impulse N    Right Head Impulse N      BPPV TESTS:  Symptoms Duration Intensity Nystagmus  Left Dix-Hallpike None; patient denies dizziness n/a n/a None observed  Right Dix-Hallpike None; patient denies dizziness n/a n/a None observed   Repeated left and right Dix-Hallpike test twice and all were negative with no nystagmus observed and patient denied dizziness.  VOR X 1 exercise:  Demonstrated and educated as to VOR X1.  Patient performed VOR X 2 for 30 seconds and 2 reps of 1 minute. Patient denied dizziness   FUNCTIONAL OUTCOME MEASURES:  Results Comments  ABC Scale   77.1% Falls risk; in need of intervention   Patient denied dizziness throughout session and was not able to reproduce patient's symptoms this date. Patient reports that she has not had an episode of vertigo since the initial event on 04/06/18. Patient does however exhibit sings of imbalance, LE weakness and gait and transfer difficulties. Patient would benefit from continued PT services to further address functional deficits and goals as set on plan of care as well as to help reduce patient's falls risk.      PT Education - 04/20/18 1352    Education Details  discussed plan of care; discussed BPPV and vestibular testing    Person(s) Educated  Patient    Methods  Explanation    Comprehension  Verbalized understanding       PT Short Term Goals - 04/20/18 1353      PT SHORT TERM GOAL #1   Time  --    Period  --    Status  --    Target Date  --        PT Long Term Goals - 03/15/18 1133      PT LONG TERM GOAL #1   Title  Pt. independent with HEP to increase B hip strength to improve functional mobility/ independence with gait.      Baseline  MMT reassessment 2/5: B LE grossly 5/5 MMT except hip flexion 4/5    Time  8    Period  Weeks    Status  Not Met    Target Date   05/10/18      PT LONG TERM GOAL #2   Title  Pt. will increase FOTO to 45 to improve functional mobility.      Baseline  Initial FOTO: 28    Time  8    Period  Weeks    Status  On-going    Target Date  05/10/18      PT LONG TERM GOAL #3   Title  Pt. will increase Berg balance test to >50/56 to improve safety/ independence  with walking.      Baseline  Berg 2/5: 48/56    Time  8    Period  Weeks    Status  Partially Met    Target Date  05/10/18      PT LONG TERM GOAL #4   Title  Pt. able to ascend/ descend stairs with recip. pattern and no UE assist safely to improve independence/ mobility.      Baseline  Pt. was able to demonstrate reciprocal pattern using 1 UE and no UE assist with PT CGA when ascending and descending stairs.    Time  8    Period  Weeks    Status  Partially Met    Target Date  05/10/18            Plan - 04/20/18 1353    Clinical Impression Statement   Patient denied dizziness throughout session and was not able to reproduce patient's symptoms this date. Patient reports that she has not had an episode of vertigo since the initial event on 04/06/18. Patient does however exhibit sings of imbalance, LE weakness and gait and transfer difficulties. Patient would benefit from continued PT services to further address functional deficits and goals as set on plan of care as well as to help reduce patient's falls risk.      Rehab Potential  Fair    PT Frequency  [place]    PT Duration  8 weeks    PT Treatment/Interventions  ADLs/Self Care Home Management;Moist Heat;Gait training;Stair training;Functional mobility training;Neuromuscular re-education;Balance training;Therapeutic exercise;Therapeutic activities;Patient/family education;Manual techniques;Passive range of motion    PT Next Visit Plan  Check in on HEP/ balance/ gait, FOTO    PT Home Exercise Plan  See HEP    Consulted and Agree with Plan of Care  Patient       Patient will benefit from skilled therapeutic  intervention in order to improve the following deficits and impairments:  Abnormal gait, Improper body mechanics, Pain, Postural dysfunction, Decreased activity tolerance, Decreased endurance, Decreased range of motion, Decreased strength, Impaired flexibility, Difficulty walking, Decreased balance  Visit Diagnosis: Gait difficulty  Recurrent falls  Dizziness and giddiness     Problem List Patient Active Problem List   Diagnosis Date Noted  . Cystocele, midline 04/28/2017  . Stress incontinence of urine 10/15/2014  . Polypharmacy 09/02/2014  . Primary cancer of right upper lobe of lung ([place]) 07/19/2014  . Acid reflux 07/18/2014  . 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 Chronic kidney disease (CKD), stage III (moderate) ([place]) 08/30/2013  . Benign essential HTN 08/30/2013  . Fatty liver disease, nonalcoholic 31/49/7026  . Fibrositis 08/30/2013  . Hot flash, menopausal 08/30/2013  . Billowing mitral valve 08/30/2013  . Allergic rhinitis, seasonal 08/30/2013  . Fibromyalgia 08/30/2013   Lady Deutscher PT, DPT [id number] Lady Deutscher 04/21/2018, 2:43 PM  [facility] [address] [place], [place], [address] Phone: [phone]   Fax:  [phone]  Name: Christy [name] MRN: [id number] Date of Birth: 27-Aug-1940

## 2018-04-24 ENCOUNTER — Ambulatory Visit: Payer: Medicare Other | Admitting: Physical Therapy

## 2018-04-26 ENCOUNTER — Ambulatory Visit (INDEPENDENT_AMBULATORY_CARE_PROVIDER_SITE_OTHER): Payer: Medicare Other | Admitting: Obstetrics & Gynecology

## 2018-04-26 ENCOUNTER — Encounter: Payer: Self-pay | Admitting: Obstetrics & Gynecology

## 2018-04-26 ENCOUNTER — Other Ambulatory Visit: Payer: Self-pay

## 2018-04-26 VITALS — BP 130/80 | Ht 65.5 in | Wt 159.0 lb

## 2018-04-26 DIAGNOSIS — N8111 Cystocele, midline: Secondary | ICD-10-CM | POA: Diagnosis not present

## 2018-04-26 DIAGNOSIS — N393 Stress incontinence (female) (male): Secondary | ICD-10-CM

## 2018-04-26 NOTE — Progress Notes (Signed)
  HPI:      [name] is a 78 y.o. who presents today for her pessary follow up and examination related to her pelvic floor weakening and urinary incontinence.  Pt reports tolerating the pessary well with  no vaginal bleeding and  no vaginal discharge.  Symptoms of pelvic floor weakening have greatly improved most times. She is voiding and defecating without difficulty. She currently has a Incontinence Ring #4 pessary.  She has had worsening leakage of urine over the last 4 weeks.  PMHx: She  has a past medical history of Allergy, Anxiety, Depression, Fatty liver disease, nonalcoholic, GERD (gastroesophageal reflux disease), Heart disease, Hypertension, Lung cancer ([place]) (02/2014), and Urinary incontinence. Also,  has a past surgical history that includes Laryngoscopy (N/A, 07/22/2014); Laparoscopic hysterectomy (1977); Lung cancer surgery; and Abdominal hysterectomy., family history includes Cancer in her maternal grandfather and maternal grandmother; Kidney cancer in her cousin.,  reports that she quit smoking about 11 years ago. Her smoking use included cigarettes. She has a 12.50 pack-year smoking history. She has never used smokeless tobacco. She reports current alcohol use of about 2.0 standard drinks of alcohol per week. She reports that she does not use drugs.  She has a current medication list which includes the following prescription(s): azelastine hcl, benazepril, calcium citrate-vitamin d, cetirizine, estradiol, meclizine, multivitamin, multiple vitamins-minerals, omeprazole, psyllium, and vitamin b-12. Also, is allergic to alendronate; sulfa antibiotics; ciprofloxacin; and diphenhydramine.  Review of Systems  All other systems reviewed and are negative.   Objective: BP 130/80   Ht 5' 5.5" (1.664 m)   Wt 159 lb (72.1 kg)   BMI 26.06 kg/m  Physical Exam Constitutional:      General: She is not in acute distress.    Appearance: She is well-developed.  Genitourinary:   Pelvic exam was performed with patient supine.     Vagina normal.     No vaginal erythema or bleeding.     Genitourinary Comments: Cuff intact/ no lesions Gr 2 Cystocele Absent uterus and cervix  HENT:     Head: Normocephalic and atraumatic.     Nose: Nose normal.  Abdominal:     General: There is no distension.     Palpations: Abdomen is soft.     Tenderness: There is no abdominal tenderness.  Musculoskeletal: Normal range of motion.  Neurological:     Mental Status: She is alert and oriented to person, place, and time.     Cranial Nerves: No cranial nerve deficit.  Skin:    General: Skin is warm and dry.     Pessary Care Pessary removed and cleaned.  Vagina checked - without erosions - pessary replaced.  A/P:1. Cystocele, midline 2. Stress incontinence of urine  Pessary was cleaned and replaced today.    Consider increasing size to #5 if incontinence continues Instructions given for care. Concerning symptoms to observe for are counseled to patient. Follow up scheduled for 3 months.  A total of 15 minutes were spent face-to-face with the patient during this encounter and over half of that time dealt with counseling and coordination of care.  Barnett Applebaum, MD, [facility], [facility] 04/26/2018  9:11 AM

## 2018-05-01 ENCOUNTER — Encounter: Payer: Medicare Other | Admitting: Physical Therapy

## 2018-05-01 ENCOUNTER — Ambulatory Visit: Payer: Medicare Other | Admitting: Obstetrics & Gynecology

## 2018-05-08 ENCOUNTER — Encounter: Payer: Medicare Other | Admitting: Physical Therapy

## 2018-07-26 ENCOUNTER — Ambulatory Visit: Payer: Medicare Other | Admitting: Obstetrics & Gynecology

## 2018-07-26 ENCOUNTER — Encounter: Payer: Self-pay | Admitting: Obstetrics & Gynecology

## 2018-07-26 ENCOUNTER — Other Ambulatory Visit: Payer: Self-pay

## 2018-07-26 VITALS — BP 120/80 | Ht 65.0 in | Wt 157.0 lb

## 2018-07-26 DIAGNOSIS — N393 Stress incontinence (female) (male): Secondary | ICD-10-CM

## 2018-07-26 DIAGNOSIS — N8111 Cystocele, midline: Secondary | ICD-10-CM

## 2018-07-26 NOTE — Progress Notes (Signed)
  HPI:      [name] is a 78 y.o. No obstetric history on file. who presents today for her pessary follow up and examination related to her pelvic floor weakening.  Pt reports tolerating the pessary well with no vaginal bleeding and no vaginal discharge.  Symptoms of pelvic floor weakening have greatly improved although she seems to have worsening incontinence in the last month of her 3 month cycle of visits and cleaning. She is voiding and defecating without difficulty. She currently has a #4 Incontinence ring pessary.  PMHx: She  has a past medical history of Allergy, Anxiety, Depression, Fatty liver disease, nonalcoholic, GERD (gastroesophageal reflux disease), Heart disease, Hypertension, Lung cancer ([place]) (02/2014), and Urinary incontinence. Also,  has a past surgical history that includes Laryngoscopy (N/A, 07/22/2014); Laparoscopic hysterectomy (1977); Lung cancer surgery; and Abdominal hysterectomy., family history includes Cancer in her maternal grandfather and maternal grandmother; Kidney cancer in her cousin.,  reports that she quit smoking about 12 years ago. Her smoking use included cigarettes. She has a 12.50 pack-year smoking history. She has never used smokeless tobacco. She reports current alcohol use of about 2.0 standard drinks of alcohol per week. She reports that she does not use drugs.  She has a current medication list which includes the following prescription(s): azelastine hcl, benazepril, calcium citrate-vitamin d, cetirizine, estradiol, meclizine, multivitamin, multiple vitamins-minerals, omeprazole, psyllium, and vitamin b-12. Also, is allergic to alendronate; sulfa antibiotics; ciprofloxacin; and diphenhydramine.  Review of Systems  All other systems reviewed and are negative.   Objective: BP 120/80   Ht 5\' 5"  (1.651 m)   Wt 157 lb (71.2 kg)   BMI 26.13 kg/m  Physical Exam Constitutional:      General: She is not in acute distress.    Appearance: She is  well-developed.  Genitourinary:     Pelvic exam was performed with patient supine.     Vagina normal.     No vaginal erythema or bleeding.     Genitourinary Comments: Cuff intact/ no lesions Vaginal vault prolapse and cystocele Absent uterus and cervix  HENT:     Head: Normocephalic and atraumatic.     Nose: Nose normal.  Abdominal:     General: There is no distension.     Palpations: Abdomen is soft.     Tenderness: There is no abdominal tenderness.  Musculoskeletal: Normal range of motion.  Neurological:     Mental Status: She is alert and oriented to person, place, and time.     Cranial Nerves: No cranial nerve deficit.  Skin:    General: Skin is warm and dry.    Pessary Care Pessary removed and cleaned.  Vagina checked - without erosions - pessary replaced.  A/P:1. Cystocele, midline 2. Stress incontinence of urine Pessary was cleaned and replaced with Incontinence Ring #5 this time. Instructions given for care. Concerning symptoms to observe for are counseled to patient. Follow up scheduled for 3 months.  A total of 15 minutes were spent face-to-face with the patient during this encounter and over half of that time dealt with counseling and coordination of care.  Barnett Applebaum, MD, [facility], [facility] 07/26/2018  10:00 AM

## 2018-08-19 ENCOUNTER — Ambulatory Visit
Admission: EM | Admit: 2018-08-19 | Discharge: 2018-08-19 | Disposition: A | Discharge status: Home / Self Care | Payer: Medicare Other | Attending: Family Medicine | Admitting: Family Medicine

## 2018-08-19 ENCOUNTER — Other Ambulatory Visit: Payer: Self-pay

## 2018-08-19 ENCOUNTER — Encounter: Payer: Self-pay | Admitting: Emergency Medicine

## 2018-08-19 DIAGNOSIS — M5432 Sciatica, left side: Secondary | ICD-10-CM

## 2018-08-19 DIAGNOSIS — W19XXXA Unspecified fall, initial encounter: Secondary | ICD-10-CM | POA: Diagnosis not present

## 2018-08-19 MED ORDER — TRAMADOL HCL 50 MG PO TABS: 50.0000 mg | ORAL_TABLET | Freq: Two times a day (BID) | ORAL | 0 refills | Status: DC | PRN

## 2018-08-19 MED ORDER — PREDNISONE 20 MG PO TABS: ORAL_TABLET | ORAL | 0 refills | Status: DC

## 2018-08-19 NOTE — ED Triage Notes (Signed)
Patient c/o pain in her lower back and goes down her left leg that started on Tuesday.

## 2018-08-19 NOTE — ED Provider Notes (Signed)
[facility]    CSN: [id number] Arrival date & time: 08/19/18  1059     History   Chief Complaint Chief Complaint  Patient presents with  . Leg Pain    left    HPI Navil Kole is a 78 y.o. female.   78 yo female with a c/o left lower back pain for the past 5 days. States pain radiates down the back of the leg and has caused her to fall several times this week at home. Denies hitting her head during the falls or loss of consciousness. States occasionally feels some tingling down the left leg, however denies saddle anesthesia or bowel/bladder incontinence or retention. States she's had sciatica in the past with similar symptoms.    Leg Pain   Past Medical History:  Diagnosis Date  . Allergy   . Anxiety   . Depression   . Fatty liver disease, nonalcoholic   . GERD (gastroesophageal reflux disease)   . Heart disease   . Hypertension   . Lung cancer ([place]) 02/2014   RUL Lobectomy  . Urinary incontinence     Patient Active Problem List   Diagnosis Date Noted  . Cystocele, midline 04/28/2017  . Stress incontinence of urine 10/15/2014  . Polypharmacy 09/02/2014  . Primary cancer of right upper lobe of lung ([place]) 07/19/2014  . Acid reflux 07/18/2014  . 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 Chronic kidney disease (CKD), stage III (moderate) ([place]) 08/30/2013  . Benign essential HTN 08/30/2013  . Fatty liver disease, nonalcoholic 40/09/6759  . Fibrositis 08/30/2013  . Hot flash, menopausal 08/30/2013  . Billowing mitral valve 08/30/2013  . Allergic rhinitis, seasonal 08/30/2013  . Fibromyalgia 08/30/2013    Past Surgical History:  Procedure Laterality Date  . ABDOMINAL HYSTERECTOMY    . LAPAROSCOPIC HYSTERECTOMY  1977  . LARYNGOSCOPY N/A 07/22/2014   Procedure: JET LARYNGOSCOPY with right vocal cord biopsy;  Surgeon: Margaretha Sheffield, MD;  Location: [facility] ORS;  Service: ENT;  Laterality: N/A;  . LUNG CANCER SURGERY       OB History   No obstetric history on file.      Home Medications    Prior to Admission medications   Medication Sig Start Date End Date Taking? Authorizing Provider  benazepril (LOTENSIN) 5 MG tablet Take 1 tablet by mouth daily. 03/04/14  Yes [provider]  calcium citrate-vitamin D 500-400 MG-UNIT chewable tablet Chew 1 tablet by mouth daily.   Yes [provider]  cetirizine (ZYRTEC) 10 MG tablet Take 10 mg by mouth daily.   Yes [provider]  estradiol (ESTRACE) 1 MG tablet  04/29/17  Yes [provider]  Multiple Vitamin (MULTIVITAMIN) tablet Take 1 tablet by mouth daily.   Yes [provider]  Multiple Vitamins-Minerals (OCUVITE ADULT 50+ PO) Take 1 tablet by mouth daily.   Yes [provider]  omeprazole (PRILOSEC) 40 MG capsule  09/23/17  Yes [provider]  psyllium (METAMUCIL) 0.52 g capsule Take by mouth.   Yes [provider]  vitamin B-12 (CYANOCOBALAMIN) 1000 MCG tablet Take 1,000 mcg by mouth daily.   Yes [provider]  Azelastine HCl 0.15 % SOLN Place 1-2 sprays into the nose 2 (two) times daily. 01/14/16 04/06/18  Betancourt, Aura Fey, NP  meclizine (ANTIVERT) 25 MG tablet Take 1 tablet (25 mg total) by mouth 3 (three) times daily as needed for dizziness. 04/06/18   Coral Spikes, DO  predniSONE ([place]) 20  MG tablet 3 tabs po once day 1, then 2 tabs po qd x 2 days, then 1 tab po qd x 2 days, then half a tab po qd x 2 days 08/19/18   Norval Gable, MD  traMADol (ULTRAM) 50 MG tablet Take 1 tablet (50 mg total) by mouth every 12 (twelve) hours as needed. 08/19/18   Norval Gable, MD    Family History Family History  Problem Relation Age of Onset  . Kidney cancer Cousin   . Cancer Maternal Grandfather   . Cancer Maternal Grandmother   . Kidney disease Neg Hx   . Prostate cancer Neg Hx     Social History Social History   Tobacco Use  . Smoking status: Former Smoker     Packs/day: 0.50    Years: 25.00    Pack years: 12.50    Types: Cigarettes    Quit date: 07/18/2006    Years since quitting: 12.0  . Smokeless tobacco: Never Used  Substance Use Topics  . Alcohol use: Yes    Alcohol/week: 2.0 standard drinks    Types: 1 Glasses of wine, 1 Cans of beer per week    Comment: occassionally about 4 times a year  . Drug use: No     Allergies   Alendronate, Sulfa antibiotics, Ciprofloxacin, and Diphenhydramine   Review of Systems Review of Systems   Physical Exam Triage Vital Signs ED Triage Vitals  Enc Vitals Group     BP 08/19/18 1126 109/62     Pulse Rate 08/19/18 1126 71     Resp 08/19/18 1126 14     Temp 08/19/18 1126 98 F (36.7 C)     Temp Source 08/19/18 1126 Oral     SpO2 08/19/18 1126 98 %     Weight 08/19/18 1122 157 lb (71.2 kg)     Height 08/19/18 1122 5\' 5"  (1.651 m)     Head Circumference --      Peak Flow --      Pain Score 08/19/18 1122 5     Pain Loc --      Pain Edu? --      Excl. in [place]? --    No data found.  Updated Vital Signs BP 109/62 (BP Location: Left Arm)   Pulse 71   Temp 98 F (36.7 C) (Oral)   Resp 14   Ht 5\' 5"  (1.651 m)   Wt 71.2 kg   SpO2 98%   BMI 26.13 kg/m   Visual Acuity Right Eye Distance:   Left Eye Distance:   Bilateral Distance:    Right Eye Near:   Left Eye Near:    Bilateral Near:     Physical Exam Vitals signs and nursing note reviewed.  Constitutional:      General: She is not in acute distress.    Appearance: She is not toxic-appearing or diaphoretic.  Musculoskeletal:     Lumbar back: She exhibits tenderness (over the left lumbar sacral paraspinous muscles and buttock). She exhibits no swelling, no edema, no deformity and no laceration.  Neurological:     Mental Status: She is alert.      UC Treatments / Results  Labs (all labs ordered are listed, but only abnormal results are displayed) Labs Reviewed - No data to display  EKG   Radiology No results found.   Procedures Procedures (including critical care time)  Medications Ordered in UC Medications - No data to display  Initial Impression / Assessment and Plan /  UC Course  I have reviewed the triage vital signs and the nursing notes.  Pertinent labs & imaging results that were available during my care of the patient were reviewed by me and considered in my medical decision making (see chart for details).      Final Clinical Impressions(s) / UC Diagnoses   Final diagnoses:  Sciatica of left side  Fall, initial encounter    ED Prescriptions    Medication Sig Dispense Auth. Provider   predniSONE (DELTASONE) 20 MG tablet 3 tabs po once day 1, then 2 tabs po qd x 2 days, then 1 tab po qd x 2 days, then half a tab po qd x 2 days 10 tablet [place] Paiz, Linward Foster, MD   traMADol (ULTRAM) 50 MG tablet Take 1 tablet (50 mg total) by mouth every 12 (twelve) hours as needed. 10 tablet Norval Gable, MD      1.diagnosis reviewed with patient 2. rx as per orders above; reviewed possible side effects, interactions, risks and benefits  3. Recommend supportive treatment with walker (given) for safety due to falls and high risk  4. Follow-up prn if symptoms worsen or don't improve  Controlled Substance Prescriptions [place] Controlled Substance Registry consulted? Not Applicable   Norval Gable, MD 08/19/18 1704

## 2018-08-26 ENCOUNTER — Emergency Department: Payer: Medicare Other

## 2018-08-26 ENCOUNTER — Encounter: Payer: Self-pay | Admitting: Emergency Medicine

## 2018-08-26 ENCOUNTER — Emergency Department
Admission: EM | Admit: 2018-08-26 | Discharge: 2018-08-26 | Disposition: A | Discharge status: Home / Self Care | Payer: Medicare Other | Attending: Emergency Medicine | Admitting: Emergency Medicine

## 2018-08-26 DIAGNOSIS — Z85118 Personal history of other malignant neoplasm of bronchus and lung: Secondary | ICD-10-CM | POA: Insufficient documentation

## 2018-08-26 DIAGNOSIS — Z79899 Other long term (current) drug therapy: Secondary | ICD-10-CM | POA: Insufficient documentation

## 2018-08-26 DIAGNOSIS — N183 Chronic kidney disease, stage 3 (moderate): Secondary | ICD-10-CM | POA: Insufficient documentation

## 2018-08-26 DIAGNOSIS — Z87891 Personal history of nicotine dependence: Secondary | ICD-10-CM | POA: Insufficient documentation

## 2018-08-26 DIAGNOSIS — I129 Hypertensive chronic kidney disease with stage 1 through stage 4 chronic kidney disease, or unspecified chronic kidney disease: Secondary | ICD-10-CM | POA: Diagnosis not present

## 2018-08-26 DIAGNOSIS — R531 Weakness: Secondary | ICD-10-CM | POA: Diagnosis present

## 2018-08-26 DIAGNOSIS — M5410 Radiculopathy, site unspecified: Secondary | ICD-10-CM | POA: Insufficient documentation

## 2018-08-26 LAB — COMPREHENSIVE METABOLIC PANEL
ALT: 24 U/L (ref 0–44)
AST: 16 U/L (ref 15–41)
Albumin: 3.5 g/dL (ref 3.5–5.0)
Alkaline Phosphatase: 57 U/L (ref 38–126)
Anion gap: 7 (ref 5–15)
BUN: 19 mg/dL (ref 8–23)
CO2: 28 mmol/L (ref 22–32)
Calcium: 9.4 mg/dL (ref 8.9–10.3)
Chloride: 103 mmol/L (ref 98–111)
Creatinine, Ser: 0.98 mg/dL (ref 0.44–1.00)
GFR calc Af Amer: >60 mL/min (ref >60)
GFR calc non Af Amer: 55 mL/min — ABNORMAL LOW (ref >60)
Glucose, Bld: 112 mg/dL — ABNORMAL HIGH (ref 70–99)
Potassium: 4 mmol/L (ref 3.5–5.1)
Sodium: 138 mmol/L (ref 135–145)
Total Bilirubin: 0.9 mg/dL (ref 0.3–1.2)
Total Protein: 6.4 g/dL — ABNORMAL LOW (ref 6.5–8.1)

## 2018-08-26 LAB — CBC WITH DIFFERENTIAL/PLATELET
Abs Immature Granulocytes: 0.09 10*3/uL — ABNORMAL HIGH (ref 0.00–0.07)
Basophils Absolute: 0.1 10*3/uL (ref 0.0–0.1)
Basophils Relative: 1 %
Eosinophils Absolute: 0.3 10*3/uL (ref 0.0–0.5)
Eosinophils Relative: 3 %
HCT: 38.5 % (ref 36.0–46.0)
Hemoglobin: 12.6 g/dL (ref 12.0–15.0)
Immature Granulocytes: 1 %
Lymphocytes Relative: 22 %
Lymphs Abs: 2.2 10*3/uL (ref 0.7–4.0)
MCH: 30.6 pg (ref 26.0–34.0)
MCHC: 32.7 g/dL (ref 30.0–36.0)
MCV: 93.4 fL (ref 80.0–100.0)
Monocytes Absolute: 1 10*3/uL (ref 0.1–1.0)
Monocytes Relative: 10 %
Neutro Abs: 6.4 10*3/uL (ref 1.7–7.7)
Neutrophils Relative %: 63 %
Platelets: 232 10*3/uL (ref 150–400)
RBC: 4.12 MIL/uL (ref 3.87–5.11)
RDW: 13 % (ref 11.5–15.5)
WBC: 10 10*3/uL (ref 4.0–10.5)
nRBC: 0 % (ref 0.0–0.2)

## 2018-08-26 NOTE — ED Triage Notes (Signed)
Low back pain radiating down L leg intermittent x 3 weeks. States fell last week because "leg went out on me".

## 2018-08-27 NOTE — ED Provider Notes (Signed)
[facility] Emergency Department Provider Note  ____________________________________________  Time seen: Approximately 12:09 AM  I have reviewed the triage vital signs and the nursing notes.   HISTORY  Chief Complaint Back Pain    HPI Christy [name] is a 78 y.o. female presents to the emergency department with concern for intermittent leg weakness.  Patient states that she has a long history of leg weakness and has been treated for peripheral neuropathy with gabapentin and Lyrica.  Patient states that she has refused to take medications directed by her primary care provider because she does not like the sedating effect.  Patient was given a prescription for prednisone a week ago.  Patient states that she experiences low back pain that radiates down the left leg.  Patient denies bowel or bladder incontinence or saddle anesthesia.  Patient secondarily states that her head does not feel right and that she has had difficulty concentrating.  She denies recent falls or traumas.  No other alleviating measures have been attempted.        Past Medical History:  Diagnosis Date  . Allergy   . Anxiety   . Depression   . Fatty liver disease, nonalcoholic   . GERD (gastroesophageal reflux disease)   . Heart disease   . Hypertension   . Lung cancer ([place]) 02/2014   RUL Lobectomy  . Urinary incontinence     Patient Active Problem List   Diagnosis Date Noted  . Cystocele, midline 04/28/2017  . Stress incontinence of urine 10/15/2014  . Polypharmacy 09/02/2014  . Primary cancer of right upper lobe of lung ([place]) 07/19/2014  . Acid reflux 07/18/2014  . 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 Chronic kidney disease (CKD), stage III (moderate) ([place]) 08/30/2013  . Benign essential HTN 08/30/2013  . Fatty liver disease, nonalcoholic 51/88/4166  . Fibrositis 08/30/2013  . Hot flash, menopausal 08/30/2013  . Billowing mitral valve  08/30/2013  . Allergic rhinitis, seasonal 08/30/2013  . Fibromyalgia 08/30/2013    Past Surgical History:  Procedure Laterality Date  . ABDOMINAL HYSTERECTOMY    . LAPAROSCOPIC HYSTERECTOMY  1977  . LARYNGOSCOPY N/A 07/22/2014   Procedure: JET LARYNGOSCOPY with right vocal cord biopsy;  Surgeon: Margaretha Sheffield, MD;  Location: [facility] ORS;  Service: ENT;  Laterality: N/A;  . LUNG CANCER SURGERY      Prior to Admission medications   Medication Sig Start Date End Date Taking? Authorizing Provider  Azelastine HCl 0.15 % SOLN Place 1-2 sprays into the nose 2 (two) times daily. 01/14/16 04/06/18  Betancourt, Aura Fey, NP  benazepril (LOTENSIN) 5 MG tablet Take 1 tablet by mouth daily. 03/04/14   [provider]  calcium citrate-vitamin D 500-400 MG-UNIT chewable tablet Chew 1 tablet by mouth daily.    [provider]  cetirizine (ZYRTEC) 10 MG tablet Take 10 mg by mouth daily.    [provider]  estradiol (ESTRACE) 1 MG tablet  04/29/17   [provider]  meclizine (ANTIVERT) 25 MG tablet Take 1 tablet (25 mg total) by mouth 3 (three) times daily as needed for dizziness. 04/06/18   Coral Spikes, DO  Multiple Vitamin (MULTIVITAMIN) tablet Take 1 tablet by mouth daily.    [provider]  Multiple Vitamins-Minerals (OCUVITE ADULT 50+ PO) Take 1 tablet by mouth daily.    [provider]  omeprazole (PRILOSEC) 40 MG capsule  09/23/17   [provider]  predniSONE (DELTASONE) 20 MG tablet 3 tabs po  once day 1, then 2 tabs po qd x 2 days, then 1 tab po qd x 2 days, then half a tab po qd x 2 days 08/19/18   Norval Gable, MD  psyllium (METAMUCIL) 0.52 g capsule Take by mouth.    [provider]  traMADol (ULTRAM) 50 MG tablet Take 1 tablet (50 mg total) by mouth every 12 (twelve) hours as needed. 08/19/18   Norval Gable, MD  vitamin B-12 (CYANOCOBALAMIN) 1000 MCG tablet Take 1,000 mcg by mouth daily.    [provider]     Allergies Alendronate, Sulfa antibiotics, Ciprofloxacin, and Diphenhydramine  Family History  Problem Relation Age of Onset  . Kidney cancer Cousin   . Cancer Maternal Grandfather   . Cancer Maternal Grandmother   . Kidney disease Neg Hx   . Prostate cancer Neg Hx     Social History Social History   Tobacco Use  . Smoking status: Former Smoker    Packs/day: 0.50    Years: 25.00    Pack years: 12.50    Types: Cigarettes    Quit date: 07/18/2006    Years since quitting: 12.1  . Smokeless tobacco: Never Used  Substance Use Topics  . Alcohol use: Yes    Alcohol/week: 2.0 standard drinks    Types: 1 Glasses of wine, 1 Cans of beer per week    Comment: occassionally about 4 times a year  . Drug use: No     Review of Systems  Constitutional: No fever/chills Eyes: No visual changes. No discharge ENT: No upper respiratory complaints. Cardiovascular: no chest pain. Respiratory: no cough. No SOB. Gastrointestinal: No abdominal pain.  No nausea, no vomiting.  No diarrhea.  No constipation. Genitourinary: Negative for dysuria. No hematuria Musculoskeletal: Patient has low back pain with left lower extremity radiculopathy. Skin: Negative for rash, abrasions, lacerations, ecchymosis. Neurological: Negative for headaches, focal weakness or numbness.   ____________________________________________   PHYSICAL EXAM:  VITAL SIGNS: ED Triage Vitals [08/26/18 1533]  Enc Vitals Group     BP 126/70     Pulse Rate 84     Resp 20     Temp 98.5 F (36.9 C)     Temp Source Oral     SpO2 98 %     Weight 157 lb (71.2 kg)     Height 5\' 5"  (1.651 m)     Head Circumference      Peak Flow      Pain Score 5     Pain Loc      Pain Edu?      Excl. in [place]?      Constitutional: Alert and oriented. Well appearing and in no acute distress. Eyes: Conjunctivae are normal. PERRL. EOMI. Head: Atraumatic.  Cardiovascular: Normal rate, regular rhythm. Normal S1 and S2.  Good peripheral  circulation. Respiratory: Normal respiratory effort without tachypnea or retractions. Lungs CTAB. Good air entry to the bases with no decreased or absent breath sounds. Musculoskeletal: Full range of motion to all extremities. No gross deformities appreciated. Neurologic:  Normal speech and language. No gross focal neurologic deficits are appreciated.  Skin:  Skin is warm, dry and intact. No rash noted. Psychiatric: Mood and affect are normal. Speech and behavior are normal. Patient exhibits appropriate insight and judgement.   ____________________________________________   LABS (all labs ordered are listed, but only abnormal results are displayed)  Labs Reviewed  CBC WITH DIFFERENTIAL/PLATELET - Abnormal; Notable for the following components:      Result  Value   Abs Immature Granulocytes 0.09 (*)    All other components within normal limits  COMPREHENSIVE METABOLIC PANEL - Abnormal; Notable for the following components:   Glucose, Bld 112 (*)    Total Protein 6.4 (*)    GFR calc non Af Amer 55 (*)    All other components within normal limits   ____________________________________________  EKG   ____________________________________________  RADIOLOGY I personally viewed and evaluated these images as part of my medical decision making, as well as reviewing the written report by the radiologist.  Ct Head Wo Contrast  Result Date: 08/26/2018 CLINICAL DATA:  Head trauma. EXAM: CT HEAD WITHOUT CONTRAST TECHNIQUE: Contiguous axial images were obtained from the base of the skull through the vertex without intravenous contrast. COMPARISON:  December 14, 2017 FINDINGS: Brain: No evidence of acute infarction, hemorrhage, hydrocephalus, extra-axial collection or mass lesion/mass effect. There is volume loss and extensive microvascular ischemic changes bilaterally. Vascular: No hyperdense vessel or unexpected calcification. Skull: Normal. Negative for fracture or focal lesion. Sinuses/Orbits:  No acute finding. Other: None. IMPRESSION: No acute intracranial abnormality. Again noted are advanced chronic microvascular ischemic changes. Electronically Signed   By: Constance Holster M.D.   On: 08/26/2018 21:33    ____________________________________________    PROCEDURES  Procedure(s) performed:    Procedures    Medications - No data to display   ____________________________________________   INITIAL IMPRESSION / ASSESSMENT AND PLAN / ED COURSE  Pertinent labs & imaging results that were available during my care of the patient were reviewed by me and considered in my medical decision making (see chart for details).  Review of the [place] CSRS was performed in accordance of the [place] prior to dispensing any controlled drugs.           Assessment and plan Low back pain with left lower extremity radiculopathy. Difficulty concentrating  78 year old female presents to the emergency department with persistent left-sided low back pain with left lower extremity radiculopathy that has been occurring chronically.  Patient also states that she has had a sensation of difficulty concentrating in her head "feeling off" Differential diagnosis included CVA, subdural hematoma, lumbar radiculopathy... Basic labs were obtained in the emergency department and were reassuring.  CT head revealed no evidence of intracranial bleed.  Patient has already been prescribed multiple medications to help manage her sciatica.  Patient has been prescribed gabapentin, Lyrica and most recently, prednisone.  Patient states that she has a follow-up appointment with her primary care provider on Tuesday.  I do not feel comfortable prescribing patient any new medications in addition to the ones that she is currently taking or refusing to take.  Advised patient to keep follow-up appointment with her primary care provider.  I reviewed patient's CT head findings with her and she was reassured.  All patient questions  were answered.    ____________________________________________  FINAL CLINICAL IMPRESSION(S) / ED DIAGNOSES  Final diagnoses:  Radiculopathy, unspecified spinal region      NEW MEDICATIONS STARTED DURING THIS VISIT:  ED Discharge Orders    None          This chart was dictated using voice recognition software/Dragon. Despite best efforts to proofread, errors can occur which can change the meaning. Any change was purely unintentional.    Lannie Fields, PA-C 08/27/18 0017    Nena Polio, MD 08/27/18 [phone]

## 2018-10-02 DIAGNOSIS — R002 Palpitations: Secondary | ICD-10-CM | POA: Insufficient documentation

## 2018-10-03 DIAGNOSIS — R0602 Shortness of breath: Secondary | ICD-10-CM | POA: Insufficient documentation

## 2018-10-25 ENCOUNTER — Other Ambulatory Visit: Payer: Self-pay

## 2018-10-25 ENCOUNTER — Ambulatory Visit (INDEPENDENT_AMBULATORY_CARE_PROVIDER_SITE_OTHER): Payer: Medicare Other | Admitting: Obstetrics & Gynecology

## 2018-10-25 ENCOUNTER — Encounter: Payer: Self-pay | Admitting: Obstetrics & Gynecology

## 2018-10-25 VITALS — BP 120/80 | Ht 65.0 in | Wt 157.0 lb

## 2018-10-25 DIAGNOSIS — N8111 Cystocele, midline: Secondary | ICD-10-CM

## 2018-10-25 DIAGNOSIS — N393 Stress incontinence (female) (male): Secondary | ICD-10-CM

## 2018-10-25 NOTE — Progress Notes (Signed)
HPI:      [name] is a 78 y.o. No obstetric history on file. who presents today for her pessary follow up and examination related to her pelvic floor weakening.  Pt reports tolerating the pessary well with no vaginal bleeding and no vaginal discharge.  Symptoms of pelvic floor weakening have greatly improved. She is voiding and defecating without difficulty. She currently has a Incontinence Ring #5 pessary (an increase in size last visit).  PMHx: She  has a past medical history of Allergy, Anxiety, Depression, Fatty liver disease, nonalcoholic, GERD (gastroesophageal reflux disease), Heart disease, Hypertension, Lung cancer ([place]) (02/2014), and Urinary incontinence. Also,  has a past surgical history that includes Laryngoscopy (N/A, 07/22/2014); Laparoscopic hysterectomy (1977); Lung cancer surgery; and Abdominal hysterectomy., family history includes Cancer in her maternal grandfather and maternal grandmother; Kidney cancer in her cousin.,  reports that she quit smoking about 12 years ago. Her smoking use included cigarettes. She has a 12.50 pack-year smoking history. She has never used smokeless tobacco. She reports current alcohol use of about 2.0 standard drinks of alcohol per week. She reports that she does not use drugs.  She has a current medication list which includes the following prescription(s): benazepril, calcium citrate-vitamin d, cetirizine, estradiol, meclizine, multivitamin, multiple vitamins-minerals, omeprazole, prednisone, psyllium, tramadol, vitamin b-12, and azelastine hcl. Also, is allergic to alendronate; sulfa antibiotics; ciprofloxacin; and diphenhydramine.  Review of Systems  All other systems reviewed and are negative.   Objective: BP 120/80   Ht 5\' 5"  (1.651 m)   Wt 157 lb (71.2 kg)   BMI 26.13 kg/m  Physical Exam Constitutional:      General: She is not in acute distress.    Appearance: She is well-developed.  Genitourinary:     Pelvic exam was  performed with patient supine.     Vagina normal.     No vaginal erythema or bleeding.     Genitourinary Comments: Vaginal vault prolapse and cystocele present  HENT:     Head: Normocephalic and atraumatic.     Nose: Nose normal.  Abdominal:     General: There is no distension.     Palpations: Abdomen is soft.     Tenderness: There is no abdominal tenderness.  Musculoskeletal: Normal range of motion.  Neurological:     Mental Status: She is alert and oriented to person, place, and time.     Cranial Nerves: No cranial nerve deficit.  Skin:    General: Skin is warm and dry.  Psychiatric:        Attention and Perception: Attention normal.        Mood and Affect: Mood normal.        Speech: Speech normal.        Behavior: Behavior normal.        Cognition and Memory: Cognition normal.        Judgment: Judgment normal.     Pessary Care Pessary removed and cleaned.  Vagina checked - without erosions - pessary replaced.  A/P:   ICD-10-CM   1. 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 Stress incontinence of urine  N39.3    Pessary was cleaned and replaced today.  Correct size, no ill effects. Instructions given for care. Concerning symptoms to observe for are counseled to patient. Follow up scheduled for 3 months.  A total of 15 minutes were spent face-to-face with the patient during this encounter and over half of that time dealt with counseling and coordination of care.  Barnett Applebaum, MD, [facility], [facility] 10/25/2018  10:00 AM

## 2019-01-03 ENCOUNTER — Other Ambulatory Visit: Payer: Self-pay

## 2019-01-03 DIAGNOSIS — Z20822 Contact with and (suspected) exposure to covid-19: Secondary | ICD-10-CM

## 2019-01-04 LAB — NOVEL CORONAVIRUS, NAA: SARS-CoV-2, NAA: NOT DETECTED

## 2019-01-09 ENCOUNTER — Other Ambulatory Visit: Payer: Self-pay | Admitting: Obstetrics & Gynecology

## 2019-01-09 ENCOUNTER — Ambulatory Visit (INDEPENDENT_AMBULATORY_CARE_PROVIDER_SITE_OTHER): Payer: Medicare Other

## 2019-01-09 ENCOUNTER — Other Ambulatory Visit: Payer: Self-pay

## 2019-01-09 ENCOUNTER — Telehealth: Payer: Self-pay

## 2019-01-09 VITALS — Temp 96.8°F

## 2019-01-09 DIAGNOSIS — R300 Dysuria: Secondary | ICD-10-CM | POA: Diagnosis not present

## 2019-01-09 DIAGNOSIS — R350 Frequency of micturition: Secondary | ICD-10-CM

## 2019-01-09 LAB — POCT URINALYSIS DIPSTICK
Appearance: ABNORMAL
Bilirubin, UA: NEGATIVE
Blood, UA: NEGATIVE
Glucose, UA: NEGATIVE
Ketones, UA: NEGATIVE
Nitrite, UA: NEGATIVE
Odor: NORMAL
Protein, UA: NEGATIVE
Spec Grav, UA: 1.01 (ref 1.010–1.025)
Urobilinogen, UA: 0.2 E.U./dL
pH, UA: 5 (ref 5.0–8.0)

## 2019-01-09 NOTE — Telephone Encounter (Signed)
Notified patient Urine Culture sent. No treatment for now. [facility] will contact her with results.

## 2019-01-09 NOTE — Progress Notes (Addendum)
Patient presents with c/o dysuria and urinary frequency. U/A performed. Trace Leuks (documented in results). Culture drawn. Note to [facility] for review/order for Culture.

## 2019-01-09 NOTE — Telephone Encounter (Signed)
-----   Message from Gae Dry, MD sent at 01/09/2019 11:22 AM EST ----- Order placed, will base tx on results

## 2019-01-13 LAB — URINE CULTURE

## 2019-01-15 ENCOUNTER — Other Ambulatory Visit: Payer: Self-pay | Admitting: Obstetrics & Gynecology

## 2019-01-15 MED ORDER — CEPHALEXIN 500 MG PO CAPS: 500.0000 mg | ORAL_CAPSULE | Freq: Four times a day (QID) | ORAL | 2 refills | Status: DC

## 2019-01-15 NOTE — Progress Notes (Signed)
ABX called in to [facility], let her know, for her UTI

## 2019-01-15 NOTE — Telephone Encounter (Signed)
Can you advise since [facility] is out if the office. Does she need a RX?

## 2019-01-15 NOTE — Telephone Encounter (Signed)
Patient is calling for labs results. Please advise. 

## 2019-01-15 NOTE — Telephone Encounter (Signed)
[facility] called abx in for patient and asked if you could let her know. Not sure if you saw that message.

## 2019-01-16 ENCOUNTER — Telehealth: Payer: Self-pay

## 2019-01-16 NOTE — Telephone Encounter (Signed)
Pt aware.

## 2019-01-16 NOTE — Telephone Encounter (Signed)
Several attempts made & unable to leave message d/t mailbox full. Patient states she received notification from the pharmacy that meds were called in. She will p/u and notify us if s&s don't clear up.

## 2019-01-24 ENCOUNTER — Other Ambulatory Visit: Payer: Self-pay

## 2019-01-24 ENCOUNTER — Encounter: Payer: Self-pay | Admitting: Obstetrics & Gynecology

## 2019-01-24 ENCOUNTER — Ambulatory Visit (INDEPENDENT_AMBULATORY_CARE_PROVIDER_SITE_OTHER): Payer: Medicare Other | Admitting: Obstetrics & Gynecology

## 2019-01-24 VITALS — BP 140/80 | Temp 94.7°F | Wt 161.0 lb

## 2019-01-24 DIAGNOSIS — N8111 Cystocele, midline: Secondary | ICD-10-CM | POA: Diagnosis not present

## 2019-01-24 DIAGNOSIS — N393 Stress incontinence (female) (male): Secondary | ICD-10-CM

## 2019-01-24 NOTE — Progress Notes (Signed)
HPI:      [name] is a 78 y.o. WF who presents today for her pessary follow up and examination related to her pelvic floor weakening.  Pt reports tolerating the pessary well with no vaginal bleeding and no vaginal discharge.  Symptoms of pelvic floor weakening have greatly improved. She is voiding and defecating without difficulty; although recent UTI worsens OAB sx's of urgency and LOU. She currently has a #5 Ring type pessary.  PMHx: She  has a past medical history of Allergy, Anxiety, Depression, Fatty liver disease, nonalcoholic, GERD (gastroesophageal reflux disease), Heart disease, Hypertension, Lung cancer ([place]) (02/2014), and Urinary incontinence. Also,  has a past surgical history that includes Laryngoscopy (N/A, 07/22/2014); Laparoscopic hysterectomy (1977); Lung cancer surgery; and Abdominal hysterectomy., family history includes Cancer in her maternal grandfather and maternal grandmother; Kidney cancer in her cousin.,  reports that she quit smoking about 12 years ago. Her smoking use included cigarettes. She has a 12.50 pack-year smoking history. She has never used smokeless tobacco. She reports current alcohol use of about 2.0 standard drinks of alcohol per week. She reports that she does not use drugs.  She has a current medication list which includes the following prescription(s): benazepril, calcium citrate-vitamin d, cephalexin, cetirizine, estradiol, meclizine, multivitamin, multiple vitamins-minerals, omeprazole, prednisone, psyllium, tramadol, vitamin b-12, and azelastine hcl. Also, is allergic to alendronate; sulfa antibiotics; ciprofloxacin; and diphenhydramine.  Review of Systems  All other systems reviewed and are negative.   Objective: BP 140/80   Temp (!) 94.7 F (34.8 C)   Wt 161 lb (73 kg)   BMI 26.79 kg/m  Physical Exam Constitutional:      General: She is not in acute distress.    Appearance: She is well-developed.  Genitourinary:     Pelvic exam  was performed with patient supine.     Vagina normal.     No vaginal erythema or bleeding.     Genitourinary Comments: Cuff intact/ no lesions Vaginal vault prolapse and cystocele presentAbsent uterus and cervix  HENT:     Head: Normocephalic and atraumatic.     Nose: Nose normal.  Abdominal:     General: There is no distension.     Palpations: Abdomen is soft.     Tenderness: There is no abdominal tenderness.  Musculoskeletal:        General: Normal range of motion.  Neurological:     Mental Status: She is alert and oriented to person, place, and time.     Cranial Nerves: No cranial nerve deficit.  Skin:    General: Skin is warm and dry.  Psychiatric:        Attention and Perception: Attention normal.        Mood and Affect: Mood normal.        Speech: Speech normal.        Behavior: Behavior normal.        Cognition and Memory: Cognition normal.        Judgment: Judgment normal.     Pessary Care Pessary removed and cleaned.  Vagina checked - without erosions - pessary replaced.  A/P:   ICD-10-CM   1. 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 Stress incontinence of urine  N39.3    Pessary was cleaned and replaced today. Instructions given for care. Concerning symptoms to observe for are counseled to patient. Follow up scheduled for 3 months.  Monitor mixed incontinence sx's; GSI helped a lot w pessary, if has worsening OAB then consider  meds.  A total of 15 minutes were spent face-to-face with the patient during this encounter and over half of that time dealt with counseling and coordination of care.  Barnett Applebaum, MD, [facility], [facility] 01/24/2019  10:59 AM

## 2019-02-28 ENCOUNTER — Ambulatory Visit: Admission: RE | Admit: 2019-02-28 | Payer: Medicare Other | Source: Ambulatory Visit

## 2019-03-03 NOTE — Progress Notes (Signed)
[facility]  [phone] [phone]) [phone]  ID: Christy [name] 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  INTERVAL HISTORY: Patient returns to clinic today for routine yearly evaluation and discussion of her imaging results.  She continues to feel well and remains asymptomatic.  She has no neurologic complaints.  She denies any recent fevers or illnesses.  She has a good appetite and denies weight loss. She has no chest pain, shortness of breath, cough, or hemoptysis. She denies any nausea, vomiting, constipation, or diarrhea. She has no urinary complaints.  Patient feels at her baseline offers no specific complaints today.  REVIEW OF SYSTEMS:   Review of Systems  Constitutional: Negative.  Negative for fever, malaise/fatigue and weight loss.  Respiratory: Negative.  Negative for cough, hemoptysis and shortness of breath.   Cardiovascular: Negative.  Negative for chest pain and leg swelling.  Gastrointestinal: Negative.  Negative for abdominal pain.  Genitourinary: Negative.  Negative for dysuria.  Musculoskeletal: Negative.  Negative for back pain.  Skin: Negative.  Negative for rash.  Neurological: Negative.  Negative for sensory change, focal weakness and weakness.  Psychiatric/Behavioral: Negative.  The patient is not nervous/anxious.     As per HPI. Otherwise, a complete review of systems is negative.  PAST MEDICAL HISTORY: Past Medical History:  Diagnosis Date  . Allergy   . Anxiety   . Depression   . Fatty liver disease, nonalcoholic   . GERD (gastroesophageal reflux disease)   . Heart disease   . Hypertension   . Lung cancer ([place]) 02/2014   RUL Lobectomy  . Urinary incontinence     PAST SURGICAL HISTORY: Past Surgical History:  Procedure Laterality Date  . ABDOMINAL  HYSTERECTOMY    . LAPAROSCOPIC HYSTERECTOMY  1977  . LARYNGOSCOPY N/A 07/22/2014   Procedure: JET LARYNGOSCOPY with right vocal cord biopsy;  Surgeon: Margaretha Sheffield, MD;  Location: [facility] ORS;  Service: ENT;  Laterality: N/A;  . LUNG CANCER SURGERY      FAMILY HISTORY Family History  Problem Relation Age of Onset  . Kidney cancer Cousin   . Cancer Maternal Grandfather   . Cancer Maternal Grandmother   . Kidney disease Neg Hx   . Prostate cancer Neg Hx        ADVANCED DIRECTIVES:    HEALTH MAINTENANCE: Social History   Tobacco Use  . Smoking status: Former Smoker    Packs/day: 0.50    Years: 25.00    Pack years: 12.50    Types: Cigarettes    Quit date: 07/18/2006    Years since quitting: 12.6  . Smokeless tobacco: Never Used  Substance Use Topics  . Alcohol use: Yes    Alcohol/week: 2.0 standard drinks    Types: 1 Glasses of wine, 1 Cans of beer per week    Comment: occassionally about 4 times a year  . Drug use: No     Allergies  Allergen Reactions  . Alendronate Other (See Comments)    Esophageal burning  . Sulfa Antibiotics Other (See Comments)  . Ciprofloxacin Other (See Comments)    Other Reaction: GI UPSET  . Diphenhydramine Other (See Comments)    Current Outpatient Medications  Medication Sig Dispense Refill  . Azelastine HCl 0.15 % SOLN Place 1-2 sprays into the nose 2 (two) times daily. 30 mL 0  . benazepril (LOTENSIN)  5 MG tablet Take 1 tablet by mouth daily.    . calcium citrate-vitamin D 500-400 MG-UNIT chewable tablet Chew 1 tablet by mouth daily.    . cetirizine (ZYRTEC) 10 MG tablet Take 10 mg by mouth daily.    . DULoxetine (CYMBALTA) 20 MG capsule Take 1 capsule by mouth daily.    Marland Kitchen estradiol (ESTRACE) 1 MG tablet     . meclizine (ANTIVERT) 25 MG tablet Take 1 tablet (25 mg total) by mouth 3 (three) times daily as needed for dizziness. 30 tablet 0  . Multiple Vitamin (MULTIVITAMIN) tablet Take 1 tablet by mouth daily.    . Multiple  Vitamins-Minerals (OCUVITE ADULT 50+ PO) Take 1 tablet by mouth daily.    Marland Kitchen omeprazole (PRILOSEC) 40 MG capsule     . predniSONE (DELTASONE) 20 MG tablet 3 tabs po once day 1, then 2 tabs po qd x 2 days, then 1 tab po qd x 2 days, then half a tab po qd x 2 days 10 tablet 0  . psyllium (METAMUCIL) 0.52 g capsule Take by mouth.    . traMADol (ULTRAM) 50 MG tablet Take 1 tablet (50 mg total) by mouth every 12 (twelve) hours as needed. 10 tablet 0  . vitamin B-12 (CYANOCOBALAMIN) 1000 MCG tablet Take 1,000 mcg by mouth daily.     No current facility-administered medications for this visit.    OBJECTIVE: Vitals:   03/07/19 1107  BP: 124/64  Pulse: 73  Temp: 98.4 F (36.9 C)     Body mass index is 26.64 kg/m.    ECOG FS:0 - Asymptomatic  General: Well-developed, well-nourished, no acute distress. Eyes: Pink conjunctiva, anicteric sclera. HEENT: Normocephalic, moist mucous membranes. Lungs: No audible wheezing or coughing. Heart: Regular rate and rhythm. Abdomen: Soft, nontender, no obvious distention. Musculoskeletal: No edema, cyanosis, or clubbing. Neuro: Alert, answering all questions appropriately. Cranial nerves grossly intact. Skin: No rashes or petechiae noted. Psych: Normal affect.   LAB RESULTS:  Lab Results  Component Value Date   NA 138 08/26/2018   K 4.0 08/26/2018   CL 103 08/26/2018   CO2 28 08/26/2018   GLUCOSE 112 (H) 08/26/2018   BUN 19 08/26/2018   CREATININE 0.98 08/26/2018   CALCIUM 9.4 08/26/2018   PROT 6.4 (L) 08/26/2018   ALBUMIN 3.5 08/26/2018   AST 16 08/26/2018   ALT 24 08/26/2018   ALKPHOS 57 08/26/2018   BILITOT 0.9 08/26/2018   GFRNONAA 55 (L) 08/26/2018   GFRAA >60 08/26/2018    Lab Results  Component Value Date   WBC 10.0 08/26/2018   NEUTROABS 6.4 08/26/2018   HGB 12.6 08/26/2018   HCT 38.5 08/26/2018   MCV 93.4 08/26/2018   PLT 232 08/26/2018     STUDIES: No results found.  ASSESSMENT: Stage Ia adenocarcinoma of the right  upper lobe lung.  PLAN:     1. 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 Pathology and surgical report reviewed independently.  Patient's most recent CT scan from February 24, 2018 with no obvious evidence of recurrent or progressive disease.  Patient will require repeat CT scan in the next 1 to 2 weeks.  Return to clinic in 1 year with repeat imaging and further evaluation at which point patient can likely be discharged from clinic.   I spent a total of 20 minutes reviewing chart data, face-to-face evaluation with the patient, counseling and coordination of care as detailed above.  Patient expressed understanding and was in agreement with this plan. She also understands that She can call clinic at any time with any questions, concerns, or complaints.    Lloyd Huger, MD   03/09/2019 5:18 AM

## 2019-03-06 ENCOUNTER — Other Ambulatory Visit: Payer: Self-pay

## 2019-03-06 NOTE — Progress Notes (Signed)
Patient pre screened for office appointment, no questions or concerns today. Patient reminded of upcoming appointment time and date. 

## 2019-03-07 ENCOUNTER — Other Ambulatory Visit: Payer: Self-pay

## 2019-03-07 ENCOUNTER — Inpatient Hospital Stay: Payer: Medicare Other | Attending: Oncology | Admitting: Oncology

## 2019-03-07 VITALS — BP 124/64 | HR 73 | Temp 98.4°F | Wt 160.1 lb

## 2019-03-07 DIAGNOSIS — Z87891 Personal history of nicotine dependence: Secondary | ICD-10-CM | POA: Insufficient documentation

## 2019-03-07 DIAGNOSIS — C3411 Malignant neoplasm of upper lobe, right bronchus or lung: Secondary | ICD-10-CM | POA: Diagnosis not present

## 2019-03-19 DIAGNOSIS — F411 Generalized anxiety disorder: Secondary | ICD-10-CM | POA: Insufficient documentation

## 2019-04-24 ENCOUNTER — Ambulatory Visit: Payer: Medicare Other | Admitting: Obstetrics & Gynecology

## 2019-05-03 ENCOUNTER — Ambulatory Visit (INDEPENDENT_AMBULATORY_CARE_PROVIDER_SITE_OTHER)
Admit: 2019-05-03 | Discharge: 2019-05-03 | Disposition: A | Discharge status: Home / Self Care | Payer: Medicare Other | Attending: Internal Medicine | Admitting: Internal Medicine

## 2019-05-03 ENCOUNTER — Other Ambulatory Visit: Payer: Self-pay

## 2019-05-03 ENCOUNTER — Ambulatory Visit
Admission: EM | Admit: 2019-05-03 | Discharge: 2019-05-03 | Disposition: A | Discharge status: Other Acute Inpatient Hospital | Payer: Medicare Other | Source: Home / Self Care

## 2019-05-03 ENCOUNTER — Ambulatory Visit (INDEPENDENT_AMBULATORY_CARE_PROVIDER_SITE_OTHER): Payer: Medicare Other

## 2019-05-03 ENCOUNTER — Inpatient Hospital Stay
Admission: EM | Admit: 2019-05-03 | Discharge: 2019-05-07 | DRG: 478 | Disposition: A | Discharge status: Skilled Nursing Facility | Payer: Medicare Other | Attending: Internal Medicine | Admitting: Internal Medicine

## 2019-05-03 ENCOUNTER — Encounter: Payer: Self-pay | Admitting: Emergency Medicine

## 2019-05-03 DIAGNOSIS — S22060A Wedge compression fracture of T7-T8 vertebra, initial encounter for closed fracture: Secondary | ICD-10-CM

## 2019-05-03 DIAGNOSIS — Z8051 Family history of malignant neoplasm of kidney: Secondary | ICD-10-CM

## 2019-05-03 DIAGNOSIS — Z20822 Contact with and (suspected) exposure to covid-19: Secondary | ICD-10-CM | POA: Diagnosis present

## 2019-05-03 DIAGNOSIS — Z881 Allergy status to other antibiotic agents status: Secondary | ICD-10-CM

## 2019-05-03 DIAGNOSIS — W1830XA Fall on same level, unspecified, initial encounter: Secondary | ICD-10-CM | POA: Diagnosis present

## 2019-05-03 DIAGNOSIS — M8088XA Other osteoporosis with current pathological fracture, vertebra(e), initial encounter for fracture: Secondary | ICD-10-CM | POA: Diagnosis present

## 2019-05-03 DIAGNOSIS — Y92009 Unspecified place in unspecified non-institutional (private) residence as the place of occurrence of the external cause: Secondary | ICD-10-CM | POA: Diagnosis not present

## 2019-05-03 DIAGNOSIS — F419 Anxiety disorder, unspecified: Secondary | ICD-10-CM | POA: Diagnosis present

## 2019-05-03 DIAGNOSIS — S12191A Other nondisplaced fracture of second cervical vertebra, initial encounter for closed fracture: Secondary | ICD-10-CM | POA: Diagnosis not present

## 2019-05-03 DIAGNOSIS — K760 Fatty (change of) liver, not elsewhere classified: Secondary | ICD-10-CM | POA: Diagnosis present

## 2019-05-03 DIAGNOSIS — M546 Pain in thoracic spine: Secondary | ICD-10-CM

## 2019-05-03 DIAGNOSIS — R0789 Other chest pain: Secondary | ICD-10-CM | POA: Diagnosis not present

## 2019-05-03 DIAGNOSIS — Z882 Allergy status to sulfonamides status: Secondary | ICD-10-CM

## 2019-05-03 DIAGNOSIS — M542 Cervicalgia: Secondary | ICD-10-CM | POA: Diagnosis present

## 2019-05-03 DIAGNOSIS — Z6826 Body mass index (BMI) 26.0-26.9, adult: Secondary | ICD-10-CM | POA: Diagnosis not present

## 2019-05-03 DIAGNOSIS — S12600A Unspecified displaced fracture of seventh cervical vertebra, initial encounter for closed fracture: Secondary | ICD-10-CM | POA: Diagnosis present

## 2019-05-03 DIAGNOSIS — Z902 Acquired absence of lung [part of]: Secondary | ICD-10-CM

## 2019-05-03 DIAGNOSIS — M797 Fibromyalgia: Secondary | ICD-10-CM | POA: Diagnosis present

## 2019-05-03 DIAGNOSIS — E669 Obesity, unspecified: Secondary | ICD-10-CM | POA: Diagnosis present

## 2019-05-03 DIAGNOSIS — S22000A Wedge compression fracture of unspecified thoracic vertebra, initial encounter for closed fracture: Secondary | ICD-10-CM | POA: Diagnosis not present

## 2019-05-03 DIAGNOSIS — N1831 Chronic kidney disease, stage 3a: Secondary | ICD-10-CM | POA: Diagnosis present

## 2019-05-03 DIAGNOSIS — W228XXA Striking against or struck by other objects, initial encounter: Secondary | ICD-10-CM | POA: Diagnosis present

## 2019-05-03 DIAGNOSIS — I10 Essential (primary) hypertension: Secondary | ICD-10-CM | POA: Diagnosis present

## 2019-05-03 DIAGNOSIS — S12200A Unspecified displaced fracture of third cervical vertebra, initial encounter for closed fracture: Secondary | ICD-10-CM | POA: Diagnosis present

## 2019-05-03 DIAGNOSIS — Z888 Allergy status to other drugs, medicaments and biological substances status: Secondary | ICD-10-CM

## 2019-05-03 DIAGNOSIS — M810 Age-related osteoporosis without current pathological fracture: Secondary | ICD-10-CM | POA: Diagnosis present

## 2019-05-03 DIAGNOSIS — M545 Low back pain: Secondary | ICD-10-CM | POA: Diagnosis present

## 2019-05-03 DIAGNOSIS — F329 Major depressive disorder, single episode, unspecified: Secondary | ICD-10-CM | POA: Diagnosis present

## 2019-05-03 DIAGNOSIS — S12100A Unspecified displaced fracture of second cervical vertebra, initial encounter for closed fracture: Secondary | ICD-10-CM | POA: Diagnosis present

## 2019-05-03 DIAGNOSIS — S12190D Other displaced fracture of second cervical vertebra, subsequent encounter for fracture with routine healing: Secondary | ICD-10-CM | POA: Diagnosis not present

## 2019-05-03 DIAGNOSIS — S129XXA Fracture of neck, unspecified, initial encounter: Secondary | ICD-10-CM | POA: Diagnosis present

## 2019-05-03 DIAGNOSIS — Z87891 Personal history of nicotine dependence: Secondary | ICD-10-CM | POA: Diagnosis not present

## 2019-05-03 DIAGNOSIS — T148XXA Other injury of unspecified body region, initial encounter: Secondary | ICD-10-CM

## 2019-05-03 DIAGNOSIS — R519 Headache, unspecified: Secondary | ICD-10-CM

## 2019-05-03 DIAGNOSIS — K219 Gastro-esophageal reflux disease without esophagitis: Secondary | ICD-10-CM | POA: Diagnosis present

## 2019-05-03 DIAGNOSIS — Z809 Family history of malignant neoplasm, unspecified: Secondary | ICD-10-CM

## 2019-05-03 DIAGNOSIS — W19XXXA Unspecified fall, initial encounter: Secondary | ICD-10-CM | POA: Diagnosis not present

## 2019-05-03 DIAGNOSIS — Z79899 Other long term (current) drug therapy: Secondary | ICD-10-CM

## 2019-05-03 DIAGNOSIS — I129 Hypertensive chronic kidney disease with stage 1 through stage 4 chronic kidney disease, or unspecified chronic kidney disease: Secondary | ICD-10-CM | POA: Diagnosis present

## 2019-05-03 DIAGNOSIS — Z85118 Personal history of other malignant neoplasm of bronchus and lung: Secondary | ICD-10-CM | POA: Diagnosis not present

## 2019-05-03 DIAGNOSIS — N183 Chronic kidney disease, stage 3 unspecified: Secondary | ICD-10-CM | POA: Diagnosis present

## 2019-05-03 LAB — CBC WITH DIFFERENTIAL/PLATELET
Abs Immature Granulocytes: 0.09 10*3/uL — ABNORMAL HIGH (ref 0.00–0.07)
Basophils Absolute: 0.1 10*3/uL (ref 0.0–0.1)
Basophils Relative: 0 %
Eosinophils Absolute: 0 10*3/uL (ref 0.0–0.5)
Eosinophils Relative: 0 %
HCT: 39.8 % (ref 36.0–46.0)
Hemoglobin: 12.9 g/dL (ref 12.0–15.0)
Immature Granulocytes: 1 %
Lymphocytes Relative: 5 %
Lymphs Abs: 1 10*3/uL (ref 0.7–4.0)
MCH: 30.4 pg (ref 26.0–34.0)
MCHC: 32.4 g/dL (ref 30.0–36.0)
MCV: 93.6 fL (ref 80.0–100.0)
Monocytes Absolute: 1.3 10*3/uL — ABNORMAL HIGH (ref 0.1–1.0)
Monocytes Relative: 7 %
Neutro Abs: 15.6 10*3/uL — ABNORMAL HIGH (ref 1.7–7.7)
Neutrophils Relative %: 87 %
Platelets: 233 10*3/uL (ref 150–400)
RBC: 4.25 MIL/uL (ref 3.87–5.11)
RDW: 12.8 % (ref 11.5–15.5)
WBC: 18 10*3/uL — ABNORMAL HIGH (ref 4.0–10.5)
nRBC: 0 % (ref 0.0–0.2)

## 2019-05-03 LAB — BASIC METABOLIC PANEL
Anion gap: 10 (ref 5–15)
BUN: 24 mg/dL — ABNORMAL HIGH (ref 8–23)
CO2: 27 mmol/L (ref 22–32)
Calcium: 9.3 mg/dL (ref 8.9–10.3)
Chloride: 100 mmol/L (ref 98–111)
Creatinine, Ser: 0.73 mg/dL (ref 0.44–1.00)
GFR calc Af Amer: >60 mL/min (ref >60)
GFR calc non Af Amer: >60 mL/min (ref >60)
Glucose, Bld: 139 mg/dL — ABNORMAL HIGH (ref 70–99)
Potassium: 3.8 mmol/L (ref 3.5–5.1)
Sodium: 137 mmol/L (ref 135–145)

## 2019-05-03 MED ORDER — FENTANYL CITRATE (PF) 100 MCG/2ML IJ SOLN
75.0000 ug | Freq: Once | INTRAMUSCULAR | Status: AC
  Administered 2019-05-03: 75 ug via INTRAVENOUS
  Filled 2019-05-03: qty 2

## 2019-05-03 MED ORDER — ONDANSETRON HCL 4 MG/2ML IJ SOLN: 4.0000 mg | Freq: Four times a day (QID) | INTRAMUSCULAR | Status: DC | PRN

## 2019-05-03 MED ORDER — ACETAMINOPHEN 325 MG PO TABS: 650.0000 mg | ORAL_TABLET | Freq: Once | ORAL | Status: DC

## 2019-05-03 MED ORDER — HYDROCODONE-ACETAMINOPHEN 5-325 MG PO TABS
1.0000 | ORAL_TABLET | ORAL | Status: DC | PRN
  Administered 2019-05-03: 1 via ORAL
  Administered 2019-05-04 – 2019-05-05 (×3): 2 via ORAL
  Administered 2019-05-06: 1 via ORAL
  Administered 2019-05-06: 2 via ORAL
  Administered 2019-05-06 – 2019-05-07 (×2): 1 via ORAL
  Administered 2019-05-07: 2 via ORAL
  Filled 2019-05-03 (×2): qty 2
  Filled 2019-05-03: qty 1
  Filled 2019-05-03 (×3): qty 2
  Filled 2019-05-03: qty 1
  Filled 2019-05-03: qty 2
  Filled 2019-05-03: qty 1

## 2019-05-03 MED ORDER — HEPARIN SODIUM (PORCINE) 5000 UNIT/ML IJ SOLN
5000.0000 [IU] | Freq: Three times a day (TID) | INTRAMUSCULAR | Status: DC
  Administered 2019-05-03 – 2019-05-04 (×2): 5000 [IU] via SUBCUTANEOUS
  Filled 2019-05-03 (×2): qty 1

## 2019-05-03 MED ORDER — ACETAMINOPHEN 650 MG RE SUPP: 650.0000 mg | Freq: Four times a day (QID) | RECTAL | Status: DC | PRN

## 2019-05-03 MED ORDER — ONDANSETRON HCL 4 MG PO TABS: 4.0000 mg | ORAL_TABLET | Freq: Four times a day (QID) | ORAL | Status: DC | PRN

## 2019-05-03 MED ORDER — ACETAMINOPHEN 325 MG PO TABS: 650.0000 mg | ORAL_TABLET | Freq: Four times a day (QID) | ORAL | Status: DC | PRN

## 2019-05-03 NOTE — ED Triage Notes (Addendum)
Pt from [facility] via [place]. Per EMS, pt st "going to my doctors appointment, I was walking and took one step down and that's all I remeber" and fell today from 4 feet high onto concrete pad, reports LOC. Husband took pt to [facility] urgent care -diagnosed with compression fracture C3 and C4, T6 bulging.  Report CP upon palpitation.  C-collar in place upon arrival.

## 2019-05-03 NOTE — ED Notes (Signed)
EDT Laury Axon agreed to take pt to assigned room 157.

## 2019-05-03 NOTE — ED Notes (Addendum)
Pt able to sit up, stand with this RN assistance. Pt urinated on floor when standing. Pt able bear weight on her legs.RN assistance. Pt c/o right to mid back pain.

## 2019-05-03 NOTE — ED Notes (Signed)
ED Provider, Goodman at bedside. 

## 2019-05-03 NOTE — ED Triage Notes (Signed)
Pt fell from about 3 feet high about 30-45 minutes ago. She is having pain in her chest, and neck. She states that it is hard to take a deep breath due to the pain in her chest. She can not turn her neck. She also his her head and has a knot on her head on the right side but she denies headache, dizziness, nausea or vomiting.

## 2019-05-03 NOTE — ED Notes (Signed)
Pt repositioned on her left side, per pt's request.

## 2019-05-03 NOTE — H&P (Signed)
History and Physical   Veora [name] BTD:[id number] DOB: 08-21-1940 DOA: 05/03/2019  Referring MD/NP/PA: Dr. Quentin Cornwall  PCP: Christy Kayser, MD   Outpatient Specialists: None  Patient coming from: Home  Chief Complaint: Fall with neck pain  HPI: Christy [name] is a 79 y.o. female with medical history significant of GERD, osteoporosis, remote lung cancer, depression with anxiety, who presented after a fall down onto the cement at home.  She fell about 3 feet and hit her right occipital head on the cement concrete.  Patient did not pass out.  She apparently walks around the neighborhood with a cane even though she has a walker.  She was coming out of her home and placed hiking down on the first day of.  Her husband was waiting at the end of the stairs wanted to hold her hand but she fell backwards.  She presented with neck pain and low back pain.  Patient found to have multiple spinal compression fractures involving the cervical and the thoracic spine.  She is currently in a neck collar.  Neurosurgery has been consulted and was not deemed to be a surgical case.  She is in pain up to 9 out of 10.  She is being admitted for pain management and safety evaluation.  She denied any numbness or loss of sensation.  Denied any loss of power or motor control..  ED Course: Temperature is 98 blood pressure 184/170 pulse 93 respirate 21 oxygen sat 93% on room air.  White count is 18,000 otherwise the rest of the CBC is within normal.  Chemistry within normal except for BUN of 24.  Glucose 139.  COVID-19 currently pending.  CT head cervical spine and thoracic spine were done.  It shows no acute brain injury.  There were minimally displaced fracture at the base of the spinous processes of C2 and C3 as well as tip of the spinous process of C7.  Also mildly commuted T7 vertebral body fracture with 50% height loss.  Patient being admitted for pain management.  Review of Systems: As per HPI otherwise 10 point  review of systems negative.    Past Medical History:  Diagnosis Date  . Allergy   . Anxiety   . Depression   . Fatty liver disease, nonalcoholic   . GERD (gastroesophageal reflux disease)   . Heart disease   . Hypertension   . Lung cancer ([place]) 02/2014   RUL Lobectomy  . Urinary incontinence     Past Surgical History:  Procedure Laterality Date  . ABDOMINAL HYSTERECTOMY    . LAPAROSCOPIC HYSTERECTOMY  1977  . LARYNGOSCOPY N/A 07/22/2014   Procedure: JET LARYNGOSCOPY with right vocal cord biopsy;  Surgeon: Margaretha Sheffield, MD;  Location: [facility] ORS;  Service: ENT;  Laterality: N/A;  . LUNG CANCER SURGERY       reports that she quit smoking about 12 years ago. Her smoking use included cigarettes. She has a 12.50 pack-year smoking history. She has never used smokeless tobacco. She reports current alcohol use of about 2.0 standard drinks of alcohol per week. She reports that she does not use drugs.  Allergies  Allergen Reactions  . Alendronate Other (See Comments)    Esophageal burning  . Sulfa Antibiotics Other (See Comments)  . Ciprofloxacin Other (See Comments)    Other Reaction: GI UPSET  . Diphenhydramine Other (See Comments)    Family History  Problem Relation Age of Onset  . Kidney cancer Cousin   . Cancer Maternal Grandfather   .  Cancer Maternal Grandmother   . Kidney disease Neg Hx   . Prostate cancer Neg Hx      Prior to Admission medications   Medication Sig Start Date End Date Taking? Authorizing Provider  Azelastine HCl 0.15 % SOLN Place 1-2 sprays into the nose 2 (two) times daily. 01/14/16 03/06/19  Betancourt, Aura Fey, NP  benazepril (LOTENSIN) 5 MG tablet Take 1 tablet by mouth daily. 03/04/14   [provider]  calcium citrate-vitamin D 500-400 MG-UNIT chewable tablet Chew 1 tablet by mouth daily.    [provider]  DULoxetine (CYMBALTA) 20 MG capsule Take 1 capsule by mouth daily. 10/09/18   [provider]  estradiol (ESTRACE) 1 MG  tablet  04/29/17   [provider]  Multiple Vitamin (MULTIVITAMIN) tablet Take 1 tablet by mouth daily.    [provider]  Multiple Vitamins-Minerals (OCUVITE ADULT 50+ PO) Take 1 tablet by mouth daily.    [provider]  nortriptyline (PAMELOR) 10 MG capsule Start Nortriptyline (Pamelor) 10 mg nightly for one week, then increase to 20 mg nightly 03/21/19   [provider]  vitamin B-12 (CYANOCOBALAMIN) 1000 MCG tablet Take 1,000 mcg by mouth daily.    [provider]    Physical Exam: Vitals:   05/03/19 1900 05/03/19 2130 05/03/19 2131 05/03/19 2303  BP: (!) 158/75  (!) 150/68 (!) 162/79  Pulse: 93 87 80 84  Resp: (!) 21 (!) 21 19 17   Temp:    98 F (36.7 C)  TempSrc:    Oral  SpO2: 93% 96% 98% 97%  Weight:      Height:          Constitutional: Frail, mild distress Vitals:   05/03/19 1900 05/03/19 2130 05/03/19 2131 05/03/19 2303  BP: (!) 158/75  (!) 150/68 (!) 162/79  Pulse: 93 87 80 84  Resp: (!) 21 (!) 21 19 17   Temp:    98 F (36.7 C)  TempSrc:    Oral  SpO2: 93% 96% 98% 97%  Weight:      Height:       Eyes: PERRL, lids and conjunctivae normal ENMT: Mucous membranes are moist. Posterior pharynx clear of any exudate or lesions.Normal dentition.  Neck: normal, supple, no masses, no thyromegaly Respiratory: clear to auscultation bilaterally, no wheezing, no crackles. Normal respiratory effort. No accessory muscle use.  Cardiovascular: Regular rate and rhythm, no murmurs / rubs / gallops. No extremity edema. 2+ pedal pulses. No carotid bruits.  Abdomen: no tenderness, no masses palpated. No hepatosplenomegaly. Bowel sounds positive.  Musculoskeletal: no clubbing / cyanosis. No joint deformity upper and lower extremities. Good ROM, no contractures. Normal muscle tone.  Has a neck collar: Tenderness over the spine thoracic area Skin: no rashes, lesions, ulcers. No induration Neurologic: CN 2-12 grossly intact. Sensation intact,  DTR normal. Strength 5/5 in all 4.  Psychiatric: Normal judgment and insight. Alert and oriented x 3. Normal mood.     Labs on Admission: I have personally reviewed following labs and imaging studies  CBC: Recent Labs  Lab 05/03/19 2108  WBC 18.0*  NEUTROABS 15.6*  HGB 12.9  HCT 39.8  MCV 93.6  PLT 867   Basic Metabolic Panel: Recent Labs  Lab 05/03/19 2108  NA 137  K 3.8  CL 100  CO2 27  GLUCOSE 139*  BUN 24*  CREATININE 0.73  CALCIUM 9.3   GFR: Estimated Creatinine Clearance: 57.6 mL/min (by C-G formula based on SCr of 0.73 mg/dL). Liver  Function Tests: No results for input(s): AST, ALT, ALKPHOS, BILITOT, PROT, ALBUMIN in the last 168 hours. No results for input(s): LIPASE, AMYLASE in the last 168 hours. No results for input(s): AMMONIA in the last 168 hours. Coagulation Profile: No results for input(s): INR, PROTIME in the last 168 hours. Cardiac Enzymes: No results for input(s): CKTOTAL, CKMB, CKMBINDEX, TROPONINI in the last 168 hours. BNP (last 3 results) No results for input(s): PROBNP in the last 8760 hours. HbA1C: No results for input(s): HGBA1C in the last 72 hours. CBG: No results for input(s): GLUCAP in the last 168 hours. Lipid Profile: No results for input(s): CHOL, HDL, LDLCALC, TRIG, CHOLHDL, LDLDIRECT in the last 72 hours. Thyroid Function Tests: No results for input(s): TSH, T4TOTAL, FREET4, T3FREE, THYROIDAB in the last 72 hours. Anemia Panel: No results for input(s): VITAMINB12, FOLATE, FERRITIN, TIBC, IRON, RETICCTPCT in the last 72 hours. Urine analysis:    Component Value Date/Time   COLORURINE YELLOW 06/17/2017 [place] 06/17/2017 1353   APPEARANCEUR Clear 10/15/2014 1143   LABSPEC 1.010 06/17/2017 1353   LABSPEC see comment 03/18/2012 1057   PHURINE 7.0 06/17/2017 1353   GLUCOSEU NEGATIVE 06/17/2017 1353   GLUCOSEU see comment 03/18/2012 1057   HGBUR NEGATIVE 06/17/2017 1353   BILIRUBINUR neg 01/09/2019 1106    BILIRUBINUR Negative 10/15/2014 1143   BILIRUBINUR see comment 03/18/2012 1057   KETONESUR NEGATIVE 06/17/2017 1353   PROTEINUR Negative 01/09/2019 1106   PROTEINUR NEGATIVE 06/17/2017 1353   UROBILINOGEN 0.2 01/09/2019 1106   NITRITE neg 01/09/2019 1106   NITRITE NEGATIVE 06/17/2017 1353   LEUKOCYTESUR Trace (A) 01/09/2019 1106   LEUKOCYTESUR Negative 10/15/2014 1143   LEUKOCYTESUR see comment 03/18/2012 1057   Sepsis Labs: @LABRCNTIP (procalcitonin:4,lacticidven:4) )No results found for this or any previous visit (from the past 240 hour(s)).   Radiological Exams on Admission: DG Chest 2 View  Result Date: 05/03/2019 CLINICAL DATA:  [age]-year-old female with fall and back pain. EXAM: CHEST - 2 VIEW COMPARISON:  Chest radiograph dated 12/05/2016 and chest CT of 02/24/2018. FINDINGS: There is no focal consolidation, pleural effusion, pneumothorax. The cardiac silhouette is within limits. The aorta is slightly tortuous. There is compression fracture of a midthoracic vertebra with anterior wedging and approximately 50% loss of vertebral body height, new since the prior chest CT. Bilateral breast implants noted. IMPRESSION: 1. No acute cardiopulmonary process. 2. Age indeterminate midthoracic compression fracture, new since the prior studies. Electronically Signed   By: Anner Crete M.D.   On: 05/03/2019 16:24   DG Thoracic Spine 2 View  Result Date: 05/03/2019 CLINICAL DATA:  T2 and T7 point tenderness from fall. EXAM: THORACIC SPINE 2 VIEWS COMPARISON:  CT 02/24/2018 FINDINGS: Subtle curvature of the thoracic spine convex right. Pedicles are intact. There is a moderate compression fracture over the midthoracic spine likely the T7 level. Age indeterminate, but is new since the previous CT scan. Remainder the exam is unremarkable. IMPRESSION: Moderate compression fracture over the midthoracic spine likely the T7 level, age indeterminate but new since the previous CT scan. Electronically  Signed   By: Marin Olp M.D.   On: 05/03/2019 16:27   CT Head Wo Contrast  Addendum Date: 05/03/2019   ADDENDUM REPORT: 05/03/2019 17:14 ADDENDUM: Critical Value/emergent results were called by telephone at the time of interpretation on 05/03/2019 at 5:14 pm to provider [facility] , who verbally acknowledged these results. Electronically Signed   By: Marin Olp M.D.   On: 05/03/2019 17:14  Result Date: 05/03/2019 CLINICAL DATA:  Fall with right occipital hematoma and neck pain. EXAM: CT HEAD WITHOUT CONTRAST CT CERVICAL SPINE WITHOUT CONTRAST TECHNIQUE: Multidetector CT imaging of the head and cervical spine was performed following the standard protocol without intravenous contrast. Multiplanar CT image reconstructions of the cervical spine were also generated. COMPARISON:  Head CT 08/26/2018 FINDINGS: CT HEAD FINDINGS Brain: Ventricles, cisterns and other CSF spaces are normal. There is moderate chronic ischemic microvascular disease. There is no mass, mass effect, shift of midline structures or acute hemorrhage. No evidence of acute infarction. Vascular: No hyperdense vessel or unexpected calcification. Skull: Normal. Negative for fracture or focal lesion. Sinuses/Orbits: No acute finding. Other: None. CT CERVICAL SPINE FINDINGS Alignment: Normal. Skull base and vertebrae: Vertebral body alignment and heights are normal. There is mild spondylosis of the cervical spine to include facet arthropathy and uncovertebral joint spurring. Small Schmorl's node over the superior endplate of C7. Atlantoaxial articulation is unremarkable. Minimally displaced fracture involving the base of the C2 spinous process extending towards the posterior aspect of the lamina bilaterally. Minimally displaced fracture of the base of the C3 spinous process. Minimally displaced fracture of the tip of the C7 spinous process. Soft tissues and spinal canal: No prevertebral fluid or swelling. No visible canal  hematoma. Disc levels:  Normal. Upper chest: Negative. Other: None. IMPRESSION: 1.  No acute brain injury. 2.  Moderate chronic ischemic microvascular disease. 3. Minimally displaced fractures at the base of the spinous processes of C2 and C3 as well as tip of the spinous process of C7. 4.  Mild spondylosis of the cervical spine. Electronically Signed: By: Marin Olp M.D. On: 05/03/2019 16:52   CT Cervical Spine Wo Contrast  Addendum Date: 05/03/2019   ADDENDUM REPORT: 05/03/2019 17:14 ADDENDUM: Critical Value/emergent results were called by telephone at the time of interpretation on 05/03/2019 at 5:14 pm to provider [facility] , who verbally acknowledged these results. Electronically Signed   By: Marin Olp M.D.   On: 05/03/2019 17:14   Result Date: 05/03/2019 CLINICAL DATA:  Fall with right occipital hematoma and neck pain. EXAM: CT HEAD WITHOUT CONTRAST CT CERVICAL SPINE WITHOUT CONTRAST TECHNIQUE: Multidetector CT imaging of the head and cervical spine was performed following the standard protocol without intravenous contrast. Multiplanar CT image reconstructions of the cervical spine were also generated. COMPARISON:  Head CT 08/26/2018 FINDINGS: CT HEAD FINDINGS Brain: Ventricles, cisterns and other CSF spaces are normal. There is moderate chronic ischemic microvascular disease. There is no mass, mass effect, shift of midline structures or acute hemorrhage. No evidence of acute infarction. Vascular: No hyperdense vessel or unexpected calcification. Skull: Normal. Negative for fracture or focal lesion. Sinuses/Orbits: No acute finding. Other: None. CT CERVICAL SPINE FINDINGS Alignment: Normal. Skull base and vertebrae: Vertebral body alignment and heights are normal. There is mild spondylosis of the cervical spine to include facet arthropathy and uncovertebral joint spurring. Small Schmorl's node over the superior endplate of C7. Atlantoaxial articulation is unremarkable. Minimally  displaced fracture involving the base of the C2 spinous process extending towards the posterior aspect of the lamina bilaterally. Minimally displaced fracture of the base of the C3 spinous process. Minimally displaced fracture of the tip of the C7 spinous process. Soft tissues and spinal canal: No prevertebral fluid or swelling. No visible canal hematoma. Disc levels:  Normal. Upper chest: Negative. Other: None. IMPRESSION: 1.  No acute brain injury. 2.  Moderate chronic ischemic microvascular disease. 3. Minimally displaced fractures at the base of  the spinous processes of C2 and C3 as well as tip of the spinous process of C7. 4.  Mild spondylosis of the cervical spine. Electronically Signed: By: Marin Olp M.D. On: 05/03/2019 16:52   CT Thoracic Spine Wo Contrast  Result Date: 05/03/2019 CLINICAL DATA:  Fall. Severe neck and thoracic spine pain with a thoracic compression fracture on chest radiographs. EXAM: CT THORACIC SPINE WITHOUT CONTRAST TECHNIQUE: Multidetector CT images of the thoracic were obtained using the standard protocol without intravenous contrast. COMPARISON:  Chest radiographs 05/03/2019. Chest CT 05/16/2017. FINDINGS: Alignment: Exaggerated upper thoracic kyphosis. No listhesis. Vertebrae: Mildly comminuted T7 vertebral body fracture with involvement of the anterior and middle columns and approximately 50% anterior vertebral body height loss without retropulsion. Chronic right-sided rib deformities. No suspicious osseous lesion. Paraspinal and other soft tissues: Mild swelling/small volume paravertebral hematoma at T7. Bilateral breast implants. Aortic and coronary artery atherosclerosis. Previous right upper lobectomy. Disc levels: No significant degenerative changes in the thoracic spine. IMPRESSION: Mildly comminuted 2-column T7 vertebral body fracture with 50% height loss. Electronically Signed   By: Logan Bores M.D.   On: 05/03/2019 17:01     Assessment/Plan Principal Problem:    Fall Active Problems:   Chronic kidney disease (CKD), stage III (moderate)   Benign essential HTN   Acid reflux   Osteoporosis, post-menopausal   Compression fracture of body of thoracic vertebra (HCC)   Compression fracture of C-spine (HCC)     #1 status post fall: Mechanical in nature.  Patient will need PT and OT consultation.  Will like to go home for her rehab.  Will consider possible home health versus rehab depending on PT recommendation.  #2 C-spine and T-spine fractures: Conservative measures only.  She is currently in the neck collar.  Pain management and PT OT  #3 osteoporosis: Continue home regimen  #4 GERD:  Continue PPI.  #5 CKDIII: Stable.   #6 Essential HTN: Uncontrolled.  Resume home regimen and monitor   DVT prophylaxis: Lovenox Code Status: Full code Family Communication: Husband Disposition Plan: Home with home health or SNF Consults called: PT OT Admission status: Observation  Severity of Illness: The appropriate patient status for this patient is OBSERVATION. Observation status is judged to be reasonable and necessary in order to provide the required intensity of service to ensure the patient's safety. The patient's presenting symptoms, physical exam findings, and initial radiographic and laboratory data in the context of their medical condition is felt to place them at decreased risk for further clinical deterioration. Furthermore, it is anticipated that the patient will be medically stable for discharge from the hospital within 2 midnights of admission. The following factors support the patient status of observation.   " The patient's presenting symptoms include fall. " The physical exam findings include tenderness on the spine. " The initial radiographic and laboratory data are multiple spinal fractures.     Barbette Merino MD [facility] Pager [phone]  If 7PM-7AM, please contact night-coverage [email or web address] Password [facility]  05/04/2019,  12:57 AM

## 2019-05-03 NOTE — ED Notes (Signed)
Pt spouse at bedside

## 2019-05-03 NOTE — ED Provider Notes (Signed)
[facility]    CSN: [id number] Arrival date & time: 05/03/19  1449      History   Chief Complaint Chief Complaint  Patient presents with  . Fall    HPI Christy [name] is a 79 y.o. female. who presents with neck pain and chest pain from a 3 ft fall down onto cement on her back and hit her R occipital head on the cement which was flat ground. She denies LOC. She states she was coming out of her home and placed her cane down the 1st step and her husband was waiting at the end of the steps to help her come down and hold her hands, but pt fell backwards and he could not catch her to help her.  She denies having a HA, but complains more of neck pain and unable to rotate her head. Her mid thoracic spine is also tender and cant take deep breaths due to chest pain, but chest wall is not tender to palpation.     Past Medical History:  Diagnosis Date  . Allergy   . Anxiety   . Depression   . Fatty liver disease, nonalcoholic   . GERD (gastroesophageal reflux disease)   . Heart disease   . Hypertension   . Lung cancer ([place]) 02/2014   RUL Lobectomy  . Urinary incontinence     Patient Active Problem List   Diagnosis Date Noted  . Cystocele, midline 04/28/2017  . Stress incontinence of urine 10/15/2014  . Polypharmacy 09/02/2014  . Primary cancer of right upper lobe of lung ([place]) 07/19/2014  . Acid reflux 07/18/2014  . 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 Chronic kidney disease (CKD), stage III (moderate) 08/30/2013  . Benign essential HTN 08/30/2013  . Fatty liver disease, nonalcoholic 27/78/2423  . Fibrositis 08/30/2013  . Hot flash, menopausal 08/30/2013  . Billowing mitral valve 08/30/2013  . Allergic rhinitis, seasonal 08/30/2013  . Fibromyalgia 08/30/2013    Past Surgical History:  Procedure Laterality Date  . ABDOMINAL HYSTERECTOMY    . LAPAROSCOPIC HYSTERECTOMY  1977  . LARYNGOSCOPY N/A 07/22/2014   Procedure:  JET LARYNGOSCOPY with right vocal cord biopsy;  Surgeon: Margaretha Sheffield, MD;  Location: [facility] ORS;  Service: ENT;  Laterality: N/A;  . LUNG CANCER SURGERY      OB History   No obstetric history on file.      Home Medications    Prior to Admission medications   Medication Sig Start Date End Date Taking? Authorizing Provider  benazepril (LOTENSIN) 5 MG tablet Take 1 tablet by mouth daily. 03/04/14  Yes [provider]  calcium citrate-vitamin D 500-400 MG-UNIT chewable tablet Chew 1 tablet by mouth daily.   Yes [provider]  DULoxetine (CYMBALTA) 20 MG capsule Take 1 capsule by mouth daily. 10/09/18  Yes [provider]  estradiol (ESTRACE) 1 MG tablet  04/29/17  Yes [provider]  Multiple Vitamin (MULTIVITAMIN) tablet Take 1 tablet by mouth daily.   Yes [provider]  Multiple Vitamins-Minerals (OCUVITE ADULT 50+ PO) Take 1 tablet by mouth daily.   Yes [provider]  nortriptyline (PAMELOR) 10 MG capsule Start Nortriptyline (Pamelor) 10 mg nightly for one week, then increase to 20 mg nightly 03/21/19  Yes [provider]  vitamin B-12 (CYANOCOBALAMIN) 1000 MCG tablet Take 1,000 mcg by mouth daily.   Yes [provider]  Azelastine HCl 0.15 % SOLN Place 1-2 sprays into the nose 2 (two) times  daily. 01/14/16 03/06/19  Betancourt, Aura Fey, NP  cetirizine (ZYRTEC) 10 MG tablet Take 10 mg by mouth daily.    [provider]  meclizine (ANTIVERT) 25 MG tablet Take 1 tablet (25 mg total) by mouth 3 (three) times daily as needed for dizziness. 04/06/18   Coral Spikes, DO  omeprazole (PRILOSEC) 40 MG capsule  09/23/17   [provider]  predniSONE (DELTASONE) 20 MG tablet 3 tabs po once day 1, then 2 tabs po qd x 2 days, then 1 tab po qd x 2 days, then half a tab po qd x 2 days 08/19/18   Norval Gable, MD  psyllium (METAMUCIL) 0.52 g capsule Take by mouth.    [provider]  traMADol (ULTRAM) 50 MG  tablet Take 1 tablet (50 mg total) by mouth every 12 (twelve) hours as needed. 08/19/18   Norval Gable, MD    Family History Family History  Problem Relation Age of Onset  . Kidney cancer Cousin   . Cancer Maternal Grandfather   . Cancer Maternal Grandmother   . Kidney disease Neg Hx   . Prostate cancer Neg Hx     Social History Social History   Tobacco Use  . Smoking status: Former Smoker    Packs/day: 0.50    Years: 25.00    Pack years: 12.50    Types: Cigarettes    Quit date: 07/18/2006    Years since quitting: 12.8  . Smokeless tobacco: Never Used  Substance Use Topics  . Alcohol use: Yes    Alcohol/week: 2.0 standard drinks    Types: 1 Glasses of wine, 1 Cans of beer per week    Comment: occassionally about 4 times a year  . Drug use: No     Allergies   Alendronate, Sulfa antibiotics, Ciprofloxacin, and Diphenhydramine   Review of Systems Review of Systems  Constitutional: Negative for chills and fever.  HENT: Negative for congestion.   Eyes: Negative for visual disturbance.  Respiratory: Negative for cough.   Cardiovascular: Positive for chest pain.  Gastrointestinal: Negative for abdominal pain, nausea and vomiting.  Genitourinary: Negative for difficulty urinating.  Musculoskeletal: Positive for back pain, neck pain and neck stiffness. Negative for arthralgias, joint swelling and myalgias.  Skin: Negative for color change, pallor, rash and wound.  Neurological: Positive for numbness. Negative for dizziness, syncope, speech difficulty, weakness and headaches.       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 --      Excl. in [place]? --    No data found.  Updated Vital Signs BP (!) 163/80 (BP Location: Left Arm)   Pulse 83   Temp 97.7 F (36.5 C) (Oral)   Resp  18   Ht 5\' 5"  (1.651 m)   Wt 160 lb (72.6 kg)   SpO2 99%   BMI 26.63 kg/m   Visual Acuity Right Eye Distance:   Left Eye Distance:   Bilateral Distance:    Right Eye Near:   Left Eye Near:    Bilateral Near:     Physical Exam Vitals and nursing note reviewed.  Constitutional:      General: She is in acute distress.     Appearance: She is normal weight.     Comments: Is on a wheelchair and in moderate pain with deep breathing and moving her neck and thorax  HENT:     Right Ear: Tympanic membrane and external ear normal.     Left Ear: Tympanic membrane and external ear normal.  Eyes:     General: Scleral icterus present.     Extraocular Movements: Extraocular movements intact.     Conjunctiva/sclera: Conjunctivae normal.     Pupils: Pupils are equal, round, and reactive to light.  Neck:     Comments: From mid C spine to T1, no crepitations noted. Is able to move head down, but cant rotate Cardiovascular:     Rate and Rhythm: Normal rate and regular rhythm.     Heart sounds: No murmur.  Pulmonary:     Breath sounds: Normal breath sounds.     Comments: Has shallow breaths to prevent pain. Does not have chest wall tenderness with palpations Abdominal:     General: Bowel sounds are normal.     Palpations: Abdomen is soft.     Tenderness: There is no abdominal tenderness.  Musculoskeletal:        General: No swelling, tenderness, deformity or signs of injury. Normal range of motion.     Comments: BACK- has point tenderness at T7, no abrasions or ecchymosis noted on her back.   Skin:    General: Skin is warm and dry.     Findings: No bruising, erythema, lesion or rash.     Comments:  Except has a 3x2 cm scalp hematoma on R occipital region  Neurological:     Mental Status: She is alert and oriented to person, place, and time.     Motor: No weakness.     Deep Tendon Reflexes: Reflexes normal.     Comments: +2/4 reflexes of upper extremities   Psychiatric:        Mood and Affect: Mood normal.        Behavior: Behavior normal.        Thought Content: Thought content normal.        Judgment: Judgment normal.      UC Treatments / Results  Labs (all labs ordered are listed, but only abnormal results are displayed) Labs Reviewed - No data to display  EKG   Radiology No results found.  Procedures Procedures (including critical care time)  Medications Ordered in UC Medications - No data to display  Initial Impression / Assessment and Plan / UC Course  I have reviewed the triage vital signs and the nursing notes. Pertinent  imaging results that were available during my care of the patient were reviewed by me and considered in my medical decision making (see chart for details). I spoke with Dr Derrel Nip at [facility] and he reviewed the results with me, wanted me to make sure I know that the  C2 extends to the posterior lamina but none of the fractures are unstable.  I reviewed all the  results with pt and her husband. She was told she needs to go to ER since she is in so much pain and can hardly move and is not comfortable breathing, and she agreed. EMS was called.   Final Clinical Impressions(s) / UC Diagnoses   Final diagnoses:  Thoracic spine pain   Discharge Instructions   None    ED Prescriptions    None     PDMP not reviewed this encounter.   Shelby Mattocks, PA-C 05/03/19 2025

## 2019-05-03 NOTE — ED Notes (Signed)
EDP Goodman at bedside at this time.

## 2019-05-03 NOTE — ED Notes (Signed)
Pt requested to be reposition. This Rn placed pillow on back and repositioned pt.

## 2019-05-03 NOTE — ED Notes (Signed)
ED Provider Archie Balboa at bedside to provide status update.

## 2019-05-04 ENCOUNTER — Encounter: Payer: Self-pay | Admitting: Internal Medicine

## 2019-05-04 ENCOUNTER — Inpatient Hospital Stay: Payer: Medicare Other

## 2019-05-04 ENCOUNTER — Inpatient Hospital Stay: Payer: Medicare Other | Admitting: Anesthesiology

## 2019-05-04 ENCOUNTER — Encounter
Admission: EM | Disposition: A | Discharge status: Skilled Nursing Facility | Payer: Self-pay | Source: Home / Self Care | Attending: Internal Medicine

## 2019-05-04 DIAGNOSIS — S22000A Wedge compression fracture of unspecified thoracic vertebra, initial encounter for closed fracture: Secondary | ICD-10-CM | POA: Diagnosis present

## 2019-05-04 DIAGNOSIS — S129XXA Fracture of neck, unspecified, initial encounter: Secondary | ICD-10-CM | POA: Diagnosis present

## 2019-05-04 HISTORY — PX: KYPHOPLASTY: SHX5884

## 2019-05-04 LAB — COMPREHENSIVE METABOLIC PANEL
ALT: 16 U/L (ref 0–44)
AST: 22 U/L (ref 15–41)
Albumin: 3.5 g/dL (ref 3.5–5.0)
Alkaline Phosphatase: 65 U/L (ref 38–126)
Anion gap: 8 (ref 5–15)
BUN: 19 mg/dL (ref 8–23)
CO2: 27 mmol/L (ref 22–32)
Calcium: 9 mg/dL (ref 8.9–10.3)
Chloride: 103 mmol/L (ref 98–111)
Creatinine, Ser: 0.68 mg/dL (ref 0.44–1.00)
GFR calc Af Amer: >60 mL/min (ref >60)
GFR calc non Af Amer: >60 mL/min (ref >60)
Glucose, Bld: 111 mg/dL — ABNORMAL HIGH (ref 70–99)
Potassium: 3.9 mmol/L (ref 3.5–5.1)
Sodium: 138 mmol/L (ref 135–145)
Total Bilirubin: 0.9 mg/dL (ref 0.3–1.2)
Total Protein: 6.9 g/dL (ref 6.5–8.1)

## 2019-05-04 LAB — CBC
HCT: 38.7 % (ref 36.0–46.0)
Hemoglobin: 12.9 g/dL (ref 12.0–15.0)
MCH: 31 pg (ref 26.0–34.0)
MCHC: 33.3 g/dL (ref 30.0–36.0)
MCV: 93 fL (ref 80.0–100.0)
Platelets: 237 10*3/uL (ref 150–400)
RBC: 4.16 MIL/uL (ref 3.87–5.11)
RDW: 12.6 % (ref 11.5–15.5)
WBC: 14.4 10*3/uL — ABNORMAL HIGH (ref 4.0–10.5)
nRBC: 0 % (ref 0.0–0.2)

## 2019-05-04 LAB — SARS CORONAVIRUS 2 (TAT 6-24 HRS): SARS Coronavirus 2: NEGATIVE

## 2019-05-04 SURGERY — KYPHOPLASTY
Anesthesia: General | Site: Back

## 2019-05-04 MED ORDER — ESTRADIOL 1 MG PO TABS
1.0000 mg | ORAL_TABLET | Freq: Every day | ORAL | Status: DC
  Administered 2019-05-04 – 2019-05-07 (×4): 1 mg via ORAL
  Filled 2019-05-04 (×6): qty 1

## 2019-05-04 MED ORDER — FENTANYL CITRATE (PF) 100 MCG/2ML IJ SOLN
INTRAMUSCULAR | Status: DC | PRN
  Administered 2019-05-04 (×2): 25 ug via INTRAVENOUS

## 2019-05-04 MED ORDER — DEXMEDETOMIDINE HCL IN NACL 200 MCG/50ML IV SOLN
INTRAVENOUS | Status: DC | PRN
  Administered 2019-05-04: 12 ug via INTRAVENOUS
  Administered 2019-05-04: 8 ug via INTRAVENOUS

## 2019-05-04 MED ORDER — FENTANYL CITRATE (PF) 100 MCG/2ML IJ SOLN
INTRAMUSCULAR | Status: AC
  Filled 2019-05-04: qty 2

## 2019-05-04 MED ORDER — BENAZEPRIL HCL 5 MG PO TABS
5.0000 mg | ORAL_TABLET | Freq: Every day | ORAL | Status: DC
  Administered 2019-05-04 – 2019-05-07 (×4): 5 mg via ORAL
  Filled 2019-05-04 (×6): qty 1

## 2019-05-04 MED ORDER — MIDAZOLAM HCL 2 MG/2ML IJ SOLN
INTRAMUSCULAR | Status: DC | PRN
  Administered 2019-05-04: 1 mg via INTRAVENOUS

## 2019-05-04 MED ORDER — MIDAZOLAM HCL 2 MG/2ML IJ SOLN
INTRAMUSCULAR | Status: AC
  Filled 2019-05-04: qty 2

## 2019-05-04 MED ORDER — BUPIVACAINE-EPINEPHRINE (PF) 0.5% -1:200000 IJ SOLN
INTRAMUSCULAR | Status: DC | PRN
  Administered 2019-05-04: 20 mL via PERINEURAL

## 2019-05-04 MED ORDER — CALCIUM CARBONATE-VITAMIN D 500-200 MG-UNIT PO TABS
1.0000 | ORAL_TABLET | Freq: Every day | ORAL | Status: DC
  Administered 2019-05-04 – 2019-05-07 (×4): 1 via ORAL
  Filled 2019-05-04 (×4): qty 1

## 2019-05-04 MED ORDER — CEFAZOLIN SODIUM-DEXTROSE 1-4 GM/50ML-% IV SOLN
1.0000 g | Freq: Once | INTRAVENOUS | Status: AC
  Administered 2019-05-04: 1 g via INTRAVENOUS
  Filled 2019-05-04: qty 50

## 2019-05-04 MED ORDER — OCUVITE ADULT 50+ PO CAPS: ORAL_CAPSULE | Freq: Every day | ORAL | Status: DC

## 2019-05-04 MED ORDER — DEXMEDETOMIDINE HCL IN NACL 80 MCG/20ML IV SOLN
INTRAVENOUS | Status: AC
  Filled 2019-05-04: qty 20

## 2019-05-04 MED ORDER — CEFAZOLIN SODIUM-DEXTROSE 1-4 GM/50ML-% IV SOLN
INTRAVENOUS | Status: AC
  Filled 2019-05-04: qty 50

## 2019-05-04 MED ORDER — MAGNESIUM HYDROXIDE 400 MG/5ML PO SUSP: 30.0000 mL | Freq: Every day | ORAL | Status: DC | PRN

## 2019-05-04 MED ORDER — OCUVITE-LUTEIN PO CAPS
1.0000 | ORAL_CAPSULE | Freq: Every day | ORAL | Status: DC
  Administered 2019-05-04 – 2019-05-07 (×4): 1 via ORAL
  Filled 2019-05-04 (×6): qty 1

## 2019-05-04 MED ORDER — LIDOCAINE HCL 1 % IJ SOLN
INTRAMUSCULAR | Status: DC | PRN
  Administered 2019-05-04 (×2): 10 mL

## 2019-05-04 MED ORDER — IOHEXOL 180 MG/ML  SOLN
INTRAMUSCULAR | Status: DC | PRN
  Administered 2019-05-04: 20 mL

## 2019-05-04 MED ORDER — ADULT MULTIVITAMIN W/MINERALS CH
1.0000 | ORAL_TABLET | Freq: Every day | ORAL | Status: DC
  Administered 2019-05-04 – 2019-05-07 (×4): 1 via ORAL
  Filled 2019-05-04 (×4): qty 1

## 2019-05-04 MED ORDER — LACTATED RINGERS IV SOLN: INTRAVENOUS | Status: DC

## 2019-05-04 MED ORDER — NORTRIPTYLINE HCL 10 MG PO CAPS
20.0000 mg | ORAL_CAPSULE | Freq: Every day | ORAL | Status: DC
  Administered 2019-05-04 – 2019-05-07 (×4): 20 mg via ORAL
  Filled 2019-05-04 (×5): qty 2

## 2019-05-04 MED ORDER — VITAMIN B-12 1000 MCG PO TABS
1000.0000 ug | ORAL_TABLET | Freq: Every day | ORAL | Status: DC
  Administered 2019-05-04 – 2019-05-07 (×4): 1000 ug via ORAL
  Filled 2019-05-04 (×4): qty 1

## 2019-05-04 MED ORDER — DULOXETINE HCL 20 MG PO CPEP
20.0000 mg | ORAL_CAPSULE | Freq: Every day | ORAL | Status: DC
  Administered 2019-05-04 – 2019-05-07 (×4): 20 mg via ORAL
  Filled 2019-05-04 (×6): qty 1

## 2019-05-04 MED ORDER — DEXMEDETOMIDINE HCL IN NACL 200 MCG/50ML IV SOLN
INTRAVENOUS | Status: DC | PRN
  Administered 2019-05-04: .5 ug/kg/h via INTRAVENOUS

## 2019-05-04 SURGICAL SUPPLY — 20 items
ADH SKN CLS APL DERMABOND .7 (GAUZE/BANDAGES/DRESSINGS) ×1
CEMENT KYPHON CX01A KIT/MIXER (Cement) ×2 IMPLANT
COVER WAND RF STERILE (DRAPES) ×2 IMPLANT
DERMABOND ADVANCED (GAUZE/BANDAGES/DRESSINGS) ×1
DERMABOND ADVANCED .7 DNX12 (GAUZE/BANDAGES/DRESSINGS) ×1 IMPLANT
DEVICE BIOPSY BONE KYPH (INSTRUMENTS) ×1 IMPLANT
DEVICE BIOPSY BONE KYPHX (INSTRUMENTS) ×2 IMPLANT
DRAPE C-ARM XRAY 36X54 (DRAPES) ×2 IMPLANT
DURAPREP 26ML APPLICATOR (WOUND CARE) ×2 IMPLANT
FEE RENTAL RFA GENERATOR (MISCELLANEOUS) IMPLANT
GLOVE SURG SYN 9.0  PF PI (GLOVE) ×2
GLOVE SURG SYN 9.0 PF PI (GLOVE) ×1 IMPLANT
GOWN SRG 2XL LVL 4 RGLN SLV (GOWNS) ×1 IMPLANT
GOWN STRL NON-REIN 2XL LVL4 (GOWNS) ×2
GOWN STRL REUS W/ TWL LRG LVL3 (GOWN DISPOSABLE) ×1 IMPLANT
GOWN STRL REUS W/TWL LRG LVL3 (GOWN DISPOSABLE) ×2
PACK KYPHOPLASTY (MISCELLANEOUS) ×2 IMPLANT
RENTAL RFA GENERATOR (MISCELLANEOUS) IMPLANT
STRAP SAFETY 5IN WIDE (MISCELLANEOUS) ×2 IMPLANT
TRAY KYPHOPAK 15/2 EXPRESS (KITS) ×1 IMPLANT

## 2019-05-04 NOTE — NC FL2 (Signed)
[place] LEVEL OF CARE SCREENING TOOL     IDENTIFICATION  Patient Name: Christy [name] Birthdate: 10-02-1940 Sex: female Admission Date (Current Location): 05/03/2019  [place] and [place] Number:  Engineering geologist and Address:  [facility], [address] [place], [place] [address]      Provider Number: [id number]  Attending Physician Name and Address:  Fritzi Mandes, MD  Relative Name and Phone Number:  Jori Moll SPouse [phone]    Current Level of Care: Hospital Recommended Level of Care: [place] Prior Approval Number:    Date Approved/Denied:   PASRR Number: [id number]  Discharge Plan: Home    Current Diagnoses: Patient Active Problem List   Diagnosis Date Noted  . Compression fracture of body of thoracic vertebra ([place]) 05/04/2019  . Compression fracture of C-spine ([place]) 05/04/2019  . Fall 05/03/2019  . Cystocele, midline 04/28/2017  . Stress incontinence of urine 10/15/2014  . Polypharmacy 09/02/2014  . Primary cancer of right upper lobe of lung ([place]) 07/19/2014  . Acid reflux 07/18/2014  . 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 Chronic kidney disease (CKD), stage III (moderate) 08/30/2013  . Benign essential HTN 08/30/2013  . Fatty liver disease, nonalcoholic 37/90/2409  . Fibrositis 08/30/2013  . Hot flash, menopausal 08/30/2013  . Billowing mitral valve 08/30/2013  . Allergic rhinitis, seasonal 08/30/2013  . Fibromyalgia 08/30/2013    Orientation RESPIRATION BLADDER Height & Weight     Self, Time, Situation, Place    Continent Weight: 72.6 kg Height:  5\' 5"  (165.1 cm)  BEHAVIORAL SYMPTOMS/MOOD NEUROLOGICAL BOWEL NUTRITION STATUS      Continent    AMBULATORY STATUS COMMUNICATION OF NEEDS Skin   Extensive Assist Verbally Surgical wounds                       Personal Care Assistance Level of Assistance  Bathing, Dressing Bathing Assistance:  Limited assistance   Dressing Assistance: Limited assistance     Functional Limitations Info             SPECIAL CARE FACTORS FREQUENCY  PT (By licensed PT), OT (By licensed OT)     PT Frequency: 5 times per week OT Frequency: 5 times per week            Contractures Contractures Info: Not present    Additional Factors Info  Allergies   Allergies Info: Alendronate, Sulfa Antibiotics, Ciprofloxacin, Diphenhydramine           Current Medications (05/04/2019):  This is the current hospital active medication list Current Facility-Administered Medications  Medication Dose Route Frequency Provider Last Rate Last Admin  . [MAR Hold] acetaminophen (TYLENOL) tablet 650 mg  650 mg Oral Q6H PRN Elwyn Reach, MD       Or  . Doug Sou Hold] acetaminophen (TYLENOL) suppository 650 mg  650 mg Rectal Q6H PRN Elwyn Reach, MD      . Doug Sou Hold] benazepril (LOTENSIN) tablet 5 mg  5 mg Oral Daily Elwyn Reach, MD      . Doug Sou Hold] calcium-vitamin D (OSCAL WITH D) 500-200 MG-UNIT per tablet 1 tablet  1 tablet Oral Daily Elwyn Reach, MD      . Doug Sou Hold] DULoxetine (CYMBALTA) DR capsule 20 mg  20 mg Oral Daily Elwyn Reach, MD      . Doug Sou Hold] estradiol (ESTRACE) tablet 1 mg  1 mg Oral Daily Elwyn Reach, MD      . [  MAR Hold] HYDROcodone-acetaminophen (NORCO/VICODIN) 5-325 MG per tablet 1-2 tablet  1-2 tablet Oral Q4H PRN Elwyn Reach, MD   2 tablet at 05/04/19 0842  . lactated ringers infusion   Intravenous Continuous Hessie Knows, MD   Stopped at 05/04/19 1319  . [MAR Hold] magnesium hydroxide (MILK OF MAGNESIA) suspension 30 mL  30 mL Oral Daily PRN Sharion Settler, NP      . Doug Sou Hold] multivitamin with minerals tablet 1 tablet  1 tablet Oral Daily Elwyn Reach, MD      . Doug Sou Hold] multivitamin-lutein (OCUVITE-LUTEIN) capsule 1 capsule  1 capsule Oral Daily Elwyn Reach, MD      . Doug Sou Hold] nortriptyline (PAMELOR) capsule 20 mg  20 mg Oral  QHS Elwyn Reach, MD      . Doug Sou Hold] ondansetron (ZOFRAN) tablet 4 mg  4 mg Oral Q6H PRN Elwyn Reach, MD       Or  . Doug Sou Hold] ondansetron (ZOFRAN) injection 4 mg  4 mg Intravenous Q6H PRN Elwyn Reach, MD      . Doug Sou Hold] vitamin B-12 (CYANOCOBALAMIN) tablet 1,000 mcg  1,000 mcg Oral Daily Elwyn Reach, MD         Discharge Medications: Please see discharge summary for a list of discharge medications.  Relevant Imaging Results:  Relevant Lab Results:   Additional Information SS# [id number]  Su Hilt, RN

## 2019-05-04 NOTE — ED Provider Notes (Signed)
[facility] Emergency Department Provider Note  ____________________________________________   I have reviewed the triage vital signs and the nursing notes.   HISTORY  Chief Complaint Back pain  History limited by: Not Limited   HPI Christy [name] is a 79 y.o. female who presents to the emergency department today because of concern for spinal fractures found on work up at urgent care. The patient went to urgent care because of back and neck pain that started after a fall. The patient fell about 3-4 feet down some steps. At urgent care had CT imaging done which showed spinous process fractures of C2,C3, C7 and compression fracture of T7. The patient is complaining of significant pain in her back. The patient denies any new numbness or weakness in her extremities.    Records reviewed. Per medical record review patient has a history of visit to urgent care with work up concerning for multiple vertebral fractures.   Past Medical History:  Diagnosis Date  . Allergy   . Anxiety   . Depression   . Fatty liver disease, nonalcoholic   . GERD (gastroesophageal reflux disease)   . Heart disease   . Hypertension   . Lung cancer ([place]) 02/2014   RUL Lobectomy  . Urinary incontinence     Patient Active Problem List   Diagnosis Date Noted  . Compression fracture of body of thoracic vertebra ([place]) 05/04/2019  . Compression fracture of C-spine ([place]) 05/04/2019  . Fall 05/03/2019  . Cystocele, midline 04/28/2017  . Stress incontinence of urine 10/15/2014  . Polypharmacy 09/02/2014  . Primary cancer of right upper lobe of lung ([place]) 07/19/2014  . Acid reflux 07/18/2014  . 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 Chronic kidney disease (CKD), stage III (moderate) 08/30/2013  . Benign essential HTN 08/30/2013  . Fatty liver disease, nonalcoholic 27/74/1287  . Fibrositis 08/30/2013  . Hot flash, menopausal 08/30/2013  .  Billowing mitral valve 08/30/2013  . Allergic rhinitis, seasonal 08/30/2013  . Fibromyalgia 08/30/2013    Past Surgical History:  Procedure Laterality Date  . ABDOMINAL HYSTERECTOMY    . LAPAROSCOPIC HYSTERECTOMY  1977  . LARYNGOSCOPY N/A 07/22/2014   Procedure: JET LARYNGOSCOPY with right vocal cord biopsy;  Surgeon: Margaretha Sheffield, MD;  Location: [facility] ORS;  Service: ENT;  Laterality: N/A;  . LUNG CANCER SURGERY      Prior to Admission medications   Medication Sig Start Date End Date Taking? Authorizing Provider  benazepril (LOTENSIN) 5 MG tablet Take 1 tablet by mouth daily. 03/04/14  Yes [provider]  calcium citrate-vitamin D 500-400 MG-UNIT chewable tablet Chew 1 tablet by mouth daily.   Yes [provider]  DULoxetine (CYMBALTA) 20 MG capsule Take 1 capsule by mouth daily. 10/09/18  Yes [provider]  estradiol (ESTRACE) 1 MG tablet Take 1 mg by mouth daily.  04/29/17  Yes [provider]  Multiple Vitamin (MULTIVITAMIN) tablet Take 1 tablet by mouth daily.   Yes [provider]  Multiple Vitamins-Minerals (OCUVITE ADULT 50+ PO) Take 1 tablet by mouth daily.   Yes [provider]  nortriptyline (PAMELOR) 10 MG capsule Take 20 mg by mouth at bedtime.  03/21/19  Yes [provider]  vitamin B-12 (CYANOCOBALAMIN) 1000 MCG tablet Take 1,000 mcg by mouth daily.   Yes [provider]    Allergies Alendronate, Sulfa antibiotics, Ciprofloxacin, and Diphenhydramine  Family History  Problem Relation Age of Onset  . Kidney cancer Cousin   .  Cancer Maternal Grandfather   . Cancer Maternal Grandmother   . Kidney disease Neg Hx   . Prostate cancer Neg Hx     Social History Social History   Tobacco Use  . Smoking status: Former Smoker    Packs/day: 0.50    Years: 25.00    Pack years: 12.50    Types: Cigarettes    Quit date: 07/18/2006    Years since quitting: 12.8  . Smokeless tobacco: Never Used  Substance  Use Topics  . Alcohol use: Yes    Alcohol/week: 2.0 standard drinks    Types: 1 Glasses of wine, 1 Cans of beer per week    Comment: occassionally about 4 times a year  . Drug use: No    Review of Systems Constitutional: No fever/chills Eyes: No visual changes. ENT: No sore throat. Cardiovascular: Denies chest pain. Respiratory: Denies shortness of breath. Gastrointestinal: No abdominal pain.  No nausea, no vomiting.  No diarrhea.   Genitourinary: Negative for dysuria. Musculoskeletal: Positive for back pain and neck pain.  Skin: Negative for rash. Neurological: Negative for headaches, focal weakness or numbness.  ____________________________________________   PHYSICAL EXAM:  VITAL SIGNS: ED Triage Vitals  Enc Vitals Group     BP 05/03/19 1824 (!) 184/170     Pulse Rate 05/03/19 1824 82     Resp 05/03/19 1824 (!) 21     Temp 05/03/19 1824 97.6 F (36.4 C)     Temp Source 05/03/19 1824 Oral     SpO2 05/03/19 1824 99 %     Weight 05/03/19 1826 160 lb (72.6 kg)     Height 05/03/19 1826 5' 5.5" (1.664 m)     Head Circumference --      Peak Flow --      Pain Score 05/03/19 1824 8   Constitutional: Alert and oriented.  Eyes: Conjunctivae are normal.  ENT      Head: Normocephalic and atraumatic.      Nose: No congestion/rhinnorhea.      Mouth/Throat: Mucous membranes are moist.      Neck: No stridor. In c-collar.  Hematological/Lymphatic/Immunilogical: No cervical lymphadenopathy. Cardiovascular: Normal rate, regular rhythm.  No murmurs, rubs, or gallops.  Respiratory: Normal respiratory effort without tachypnea nor retractions. Breath sounds are clear and equal bilaterally. No wheezes/rales/rhonchi. Gastrointestinal: Soft and non tender. No rebound. No guarding.  Genitourinary: Deferred Musculoskeletal: Normal range of motion in all extremities. No lower extremity edema. Neurologic:  Normal speech and language. No gross focal neurologic deficits are appreciated.   Skin:  Skin is warm, dry and intact. No rash noted. Psychiatric: Mood and affect are normal. Speech and behavior are normal. Patient exhibits appropriate insight and judgment.  ____________________________________________    LABS (pertinent positives/negatives)  BMP wnl except glu 139, bun 24 CBC wbc 18.0, hgb 12.9, plt 233  ____________________________________________   EKG  None  ____________________________________________    RADIOLOGY  None  ____________________________________________   PROCEDURES  Procedures  ____________________________________________   INITIAL IMPRESSION / ASSESSMENT AND PLAN / ED COURSE  Pertinent labs & imaging results that were available during my care of the patient were reviewed by me and considered in my medical decision making (see chart for details).   Patient from urgent care after fall and vertiberal fractures. Discussed with Dr. Izora Ribas with neurosurgery who recommended hard cervical collar. Patient however with significant pain. Do not think she would be a safe discharge at this time. Will plan on admission.   ____________________________________________   FINAL CLINICAL  IMPRESSION(S) / ED DIAGNOSES  Final diagnoses:  Fracture     Note: This dictation was prepared with Dragon dictation. Any transcriptional errors that result from this process are unintentional     Nance Pear, MD 05/04/19 1555

## 2019-05-04 NOTE — Consult Note (Signed)
Reason for Consult: T7 compression fracture Referring Physician: Dr. Sheliah Plane Laurette Christy [name] is an 79 y.o. female.  HPI: Patient is a 79 year old who has some spinal stenosis and some difficulty walking at times.  She uses a walker most of the time was using a cane yesterday when going downstairs she suffered a fall fell backwards suffering spinous process fractures in the cervical spine and T7 compression fracture.  She is having severe pain and difficulty with mobility.  She is requiring assistance to get in and out of bed.  She reports a history of spinal stenosis and that is one of the causes of her problems with ambulation.  She lives at home with her husband who she reports is more frail than herself.  Her daughter also assists but does not live with them.  She reports severe back pain.  Past Medical History:  Diagnosis Date  . Allergy   . Anxiety   . Depression   . Fatty liver disease, nonalcoholic   . GERD (gastroesophageal reflux disease)   . Heart disease   . Hypertension   . Lung cancer ([place]) 02/2014   RUL Lobectomy  . Urinary incontinence     Past Surgical History:  Procedure Laterality Date  . ABDOMINAL HYSTERECTOMY    . LAPAROSCOPIC HYSTERECTOMY  1977  . LARYNGOSCOPY N/A 07/22/2014   Procedure: JET LARYNGOSCOPY with right vocal cord biopsy;  Surgeon: Margaretha Sheffield, MD;  Location: [facility] ORS;  Service: ENT;  Laterality: N/A;  . LUNG CANCER SURGERY      Family History  Problem Relation Age of Onset  . Kidney cancer Cousin   . Cancer Maternal Grandfather   . Cancer Maternal Grandmother   . Kidney disease Neg Hx   . Prostate cancer Neg Hx     Social History:  reports that she quit smoking about 12 years ago. Her smoking use included cigarettes. She has a 12.50 pack-year smoking history. She has never used smokeless tobacco. She reports current alcohol use of about 2.0 standard drinks of alcohol per week. She reports that she does not use drugs.  Allergies:  Allergies   Allergen Reactions  . Alendronate Other (See Comments)    Esophageal burning  . Sulfa Antibiotics Other (See Comments)  . Ciprofloxacin Other (See Comments)    Other Reaction: GI UPSET  . Diphenhydramine Other (See Comments)    Medications: I have reviewed the patient's current medications.  Results for orders placed or performed during the hospital encounter of 05/03/19 (from the past 48 hour(s))  CBC with Differential     Status: Abnormal   Collection Time: 05/03/19  9:08 PM  Result Value Ref Range   WBC 18.0 (H) 4.0 - 10.5 K/uL   RBC 4.25 3.87 - 5.11 MIL/uL   Hemoglobin 12.9 12.0 - 15.0 g/dL   HCT 39.8 36.0 - 46.0 %   MCV 93.6 80.0 - 100.0 fL   MCH 30.4 26.0 - 34.0 pg   MCHC 32.4 30.0 - 36.0 g/dL   RDW 12.8 11.5 - 15.5 %   Platelets 233 150 - 400 K/uL   nRBC 0.0 0.0 - 0.2 %   Neutrophils Relative % 87 %   Neutro Abs 15.6 (H) 1.7 - 7.7 K/uL   Lymphocytes Relative 5 %   Lymphs Abs 1.0 0.7 - 4.0 K/uL   Monocytes Relative 7 %   Monocytes Absolute 1.3 (H) 0.1 - 1.0 K/uL   Eosinophils Relative 0 %   Eosinophils Absolute 0.0 0.0 - 0.5  K/uL   Basophils Relative 0 %   Basophils Absolute 0.1 0.0 - 0.1 K/uL   Immature Granulocytes 1 %   Abs Immature Granulocytes 0.09 (H) 0.00 - 0.07 K/uL    Comment: Performed at [facility], [address] [place], [place] [address]  Basic metabolic panel     Status: Abnormal   Collection Time: 05/03/19  9:08 PM  Result Value Ref Range   Sodium 137 135 - 145 mmol/L   Potassium 3.8 3.5 - 5.1 mmol/L   Chloride 100 98 - 111 mmol/L   CO2 27 22 - 32 mmol/L   Glucose, Bld 139 (H) 70 - 99 mg/dL    Comment: Glucose reference range applies only to samples taken after fasting for at least 8 hours.   BUN 24 (H) 8 - 23 mg/dL   Creatinine, Ser 0.73 0.44 - 1.00 mg/dL   Calcium 9.3 8.9 - 10.3 mg/dL   GFR calc non Af Amer >60 >60 mL/min   GFR calc Af Amer >60 >60 mL/min   Anion gap 10 5 - 15    Comment: Performed at [facility],  [place], [place] [address]  SARS CORONAVIRUS 2 (TAT 6-24 HRS) Nasopharyngeal Nasopharyngeal Swab     Status: None   Collection Time: 05/03/19  9:08 PM   Specimen: Nasopharyngeal Swab  Result Value Ref Range   SARS Coronavirus 2 NEGATIVE NEGATIVE    Comment: (NOTE) SARS-CoV-2 target nucleic acids are NOT DETECTED. The SARS-CoV-2 RNA is generally detectable in upper and lower respiratory specimens during the acute phase of infection. Negative results do not preclude SARS-CoV-2 infection, do not rule out co-infections with other pathogens, and should not be used as the sole basis for treatment or other patient management decisions. Negative results must be combined with clinical observations, patient history, and epidemiological information. The expected result is Negative. Fact Sheet for Patients: SugarRoll.be Fact Sheet for Healthcare Providers: https://www.woods-mathews.com/ This test is not yet approved or cleared by the [place] FDA and  has been authorized for detection and/or diagnosis of SARS-CoV-2 by FDA under an Emergency Use Authorization (EUA). This EUA will remain  in effect (meaning this test can be used) for the duration of the COVID-19 declaration under Section 56 4(b)(1) of the Act, 21 U.S.C. section 360bbb-3(b)(1), unless the authorization is terminated or revoked sooner. Performed at [facility], [place] [address] [place], [place] [address]   Comprehensive metabolic panel     Status: Abnormal   Collection Time: 05/04/19  6:10 AM  Result Value Ref Range   Sodium 138 135 - 145 mmol/L   Potassium 3.9 3.5 - 5.1 mmol/L   Chloride 103 98 - 111 mmol/L   CO2 27 22 - 32 mmol/L   Glucose, Bld 111 (H) 70 - 99 mg/dL    Comment: Glucose reference range applies only to samples taken after fasting for at least 8 hours.   BUN 19 8 - 23 mg/dL   Creatinine, Ser 0.68 0.44 - 1.00 mg/dL   Calcium 9.0 8.9 - 10.3  mg/dL   Total Protein 6.9 6.5 - 8.1 g/dL   Albumin 3.5 3.5 - 5.0 g/dL   AST 22 15 - 41 U/L   ALT 16 0 - 44 U/L   Alkaline Phosphatase 65 38 - 126 U/L   Total Bilirubin 0.9 0.3 - 1.2 mg/dL   GFR calc non Af Amer >60 >60 mL/min   GFR calc Af Amer >60 >60 mL/min  Anion gap 8 5 - 15    Comment: Performed at [facility], [address] [place], [place] [address]  CBC     Status: Abnormal   Collection Time: 05/04/19  6:10 AM  Result Value Ref Range   WBC 14.4 (H) 4.0 - 10.5 K/uL   RBC 4.16 3.87 - 5.11 MIL/uL   Hemoglobin 12.9 12.0 - 15.0 g/dL   HCT 38.7 36.0 - 46.0 %   MCV 93.0 80.0 - 100.0 fL   MCH 31.0 26.0 - 34.0 pg   MCHC 33.3 30.0 - 36.0 g/dL   RDW 12.6 11.5 - 15.5 %   Platelets 237 150 - 400 K/uL   nRBC 0.0 0.0 - 0.2 %    Comment: Performed at [facility], [address] [place], [place] [address]    DG Chest 2 View  Result Date: 05/03/2019 CLINICAL DATA:  28 55-year-old female with fall and back pain. EXAM: CHEST - 2 VIEW COMPARISON:  Chest radiograph dated 12/05/2016 and chest CT of 02/24/2018. FINDINGS: There is no focal consolidation, pleural effusion, pneumothorax. The cardiac silhouette is within limits. The aorta is slightly tortuous. There is compression fracture of a midthoracic vertebra with anterior wedging and approximately 50% loss of vertebral body height, new since the prior chest CT. Bilateral breast implants noted. IMPRESSION: 1. No acute cardiopulmonary process. 2. Age indeterminate midthoracic compression fracture, new since the prior studies. Electronically Signed   By: Anner Crete M.D.   On: 05/03/2019 16:24   DG Thoracic Spine 2 View  Result Date: 05/03/2019 CLINICAL DATA:  T2 and T7 point tenderness from fall. EXAM: THORACIC SPINE 2 VIEWS COMPARISON:  CT 02/24/2018 FINDINGS: Subtle curvature of the thoracic spine convex right. Pedicles are intact. There is a moderate compression fracture over the midthoracic spine likely the T7  level. Age indeterminate, but is new since the previous CT scan. Remainder the exam is unremarkable. IMPRESSION: Moderate compression fracture over the midthoracic spine likely the T7 level, age indeterminate but new since the previous CT scan. Electronically Signed   By: Marin Olp M.D.   On: 05/03/2019 16:27   CT Head Wo Contrast  Addendum Date: 05/03/2019   ADDENDUM REPORT: 05/03/2019 17:14 ADDENDUM: Critical Value/emergent results were called by telephone at the time of interpretation on 05/03/2019 at 5:14 pm to provider [facility] , who verbally acknowledged these results. Electronically Signed   By: Marin Olp M.D.   On: 05/03/2019 17:14   Result Date: 05/03/2019 CLINICAL DATA:  Fall with right occipital hematoma and neck pain. EXAM: CT HEAD WITHOUT CONTRAST CT CERVICAL SPINE WITHOUT CONTRAST TECHNIQUE: Multidetector CT imaging of the head and cervical spine was performed following the standard protocol without intravenous contrast. Multiplanar CT image reconstructions of the cervical spine were also generated. COMPARISON:  Head CT 08/26/2018 FINDINGS: CT HEAD FINDINGS Brain: Ventricles, cisterns and other CSF spaces are normal. There is moderate chronic ischemic microvascular disease. There is no mass, mass effect, shift of midline structures or acute hemorrhage. No evidence of acute infarction. Vascular: No hyperdense vessel or unexpected calcification. Skull: Normal. Negative for fracture or focal lesion. Sinuses/Orbits: No acute finding. Other: None. CT CERVICAL SPINE FINDINGS Alignment: Normal. Skull base and vertebrae: Vertebral body alignment and heights are normal. There is mild spondylosis of the cervical spine to include facet arthropathy and uncovertebral joint spurring. Small Schmorl's node over the superior endplate of C7. Atlantoaxial articulation is unremarkable. Minimally displaced fracture involving the base of the C2 spinous process  extending towards the posterior  aspect of the lamina bilaterally. Minimally displaced fracture of the base of the C3 spinous process. Minimally displaced fracture of the tip of the C7 spinous process. Soft tissues and spinal canal: No prevertebral fluid or swelling. No visible canal hematoma. Disc levels:  Normal. Upper chest: Negative. Other: None. IMPRESSION: 1.  No acute brain injury. 2.  Moderate chronic ischemic microvascular disease. 3. Minimally displaced fractures at the base of the spinous processes of C2 and C3 as well as tip of the spinous process of C7. 4.  Mild spondylosis of the cervical spine. Electronically Signed: By: Marin Olp M.D. On: 05/03/2019 16:52   CT Cervical Spine Wo Contrast  Addendum Date: 05/03/2019   ADDENDUM REPORT: 05/03/2019 17:14 ADDENDUM: Critical Value/emergent results were called by telephone at the time of interpretation on 05/03/2019 at 5:14 pm to provider [facility] , who verbally acknowledged these results. Electronically Signed   By: Marin Olp M.D.   On: 05/03/2019 17:14   Result Date: 05/03/2019 CLINICAL DATA:  Fall with right occipital hematoma and neck pain. EXAM: CT HEAD WITHOUT CONTRAST CT CERVICAL SPINE WITHOUT CONTRAST TECHNIQUE: Multidetector CT imaging of the head and cervical spine was performed following the standard protocol without intravenous contrast. Multiplanar CT image reconstructions of the cervical spine were also generated. COMPARISON:  Head CT 08/26/2018 FINDINGS: CT HEAD FINDINGS Brain: Ventricles, cisterns and other CSF spaces are normal. There is moderate chronic ischemic microvascular disease. There is no mass, mass effect, shift of midline structures or acute hemorrhage. No evidence of acute infarction. Vascular: No hyperdense vessel or unexpected calcification. Skull: Normal. Negative for fracture or focal lesion. Sinuses/Orbits: No acute finding. Other: None. CT CERVICAL SPINE FINDINGS Alignment: Normal. Skull base and vertebrae: Vertebral body  alignment and heights are normal. There is mild spondylosis of the cervical spine to include facet arthropathy and uncovertebral joint spurring. Small Schmorl's node over the superior endplate of C7. Atlantoaxial articulation is unremarkable. Minimally displaced fracture involving the base of the C2 spinous process extending towards the posterior aspect of the lamina bilaterally. Minimally displaced fracture of the base of the C3 spinous process. Minimally displaced fracture of the tip of the C7 spinous process. Soft tissues and spinal canal: No prevertebral fluid or swelling. No visible canal hematoma. Disc levels:  Normal. Upper chest: Negative. Other: None. IMPRESSION: 1.  No acute brain injury. 2.  Moderate chronic ischemic microvascular disease. 3. Minimally displaced fractures at the base of the spinous processes of C2 and C3 as well as tip of the spinous process of C7. 4.  Mild spondylosis of the cervical spine. Electronically Signed: By: Marin Olp M.D. On: 05/03/2019 16:52   CT Thoracic Spine Wo Contrast  Result Date: 05/03/2019 CLINICAL DATA:  Fall. Severe neck and thoracic spine pain with a thoracic compression fracture on chest radiographs. EXAM: CT THORACIC SPINE WITHOUT CONTRAST TECHNIQUE: Multidetector CT images of the thoracic were obtained using the standard protocol without intravenous contrast. COMPARISON:  Chest radiographs 05/03/2019. Chest CT 05/16/2017. FINDINGS: Alignment: Exaggerated upper thoracic kyphosis. No listhesis. Vertebrae: Mildly comminuted T7 vertebral body fracture with involvement of the anterior and middle columns and approximately 50% anterior vertebral body height loss without retropulsion. Chronic right-sided rib deformities. No suspicious osseous lesion. Paraspinal and other soft tissues: Mild swelling/small volume paravertebral hematoma at T7. Bilateral breast implants. Aortic and coronary artery atherosclerosis. Previous right upper lobectomy. Disc levels: No  significant degenerative changes in the thoracic spine. IMPRESSION: Mildly comminuted 2-column T7  vertebral body fracture with 50% height loss. Electronically Signed   By: Logan Bores M.D.   On: 05/03/2019 17:01    Review of Systems Blood pressure (!) 179/97, pulse 90, temperature 98.3 F (36.8 C), temperature source Oral, resp. rate 17, height 5\' 5"  (1.651 m), weight 72.6 kg, SpO2 98 %. Physical Exam she has a kyphotic deformity at T7 which is tender to percussion.  She is wearing a cervical collar and has tenderness over her cervical spine posteriorly.  She has no clonus.  She is able flex extend the toes.  Sensation is diminished and she reports this is usual for her secondary to some neuropathy and spinal stenosis. Radiographic review shows CT of the cervical spine and thoracic spine shows acute T7 compression fracture with 50% compression no other thoracic injury.  There are the spinous fractures posteriorly in the neck that are essentially nondisplaced and there is no other posterior element injury.  Assessment/Plan: T7 compression fracture severe pain Recommendation is for kyphoplasty later today.  She is been made n.p.o., brochure regarding kyphoplasty was given to the patient and bone model reviewed to explain the procedure to her.  Hessie Knows 05/04/2019, 8:20 AM

## 2019-05-04 NOTE — Progress Notes (Signed)
[place] [facility] [place] NAME: Christy [name]    MR#:  [id number]  DATE OF BIRTH:  1940-08-13  SUBJECTIVE:  patient had a mechanical fall at home. She fell backwards on a cement/concrete floor and hit her head. Complains of back pain does not like to wear the neck collar feels uncomfortable-- she understands she has to wear it.  REVIEW OF SYSTEMS:   Review of Systems  Constitutional: Negative for chills, fever and weight loss.  HENT: Negative for ear discharge, ear pain and nosebleeds.   Eyes: Negative for blurred vision, pain and discharge.  Respiratory: Negative for sputum production, shortness of breath, wheezing and stridor.   Cardiovascular: Negative for chest pain, palpitations, orthopnea and PND.  Gastrointestinal: Negative for abdominal pain, diarrhea, nausea and vomiting.  Genitourinary: Negative for frequency and urgency.  Musculoskeletal: Positive for back pain, falls and joint pain.  Neurological: Positive for weakness. Negative for sensory change, speech change and focal weakness.  Psychiatric/Behavioral: Negative for depression and hallucinations. The patient is not nervous/anxious.    Tolerating Diet: Tolerating PT: pending  DRUG ALLERGIES:   Allergies  Allergen Reactions  . Alendronate Other (See Comments)    Esophageal burning  . Sulfa Antibiotics Other (See Comments)  . Ciprofloxacin Other (See Comments)    Other Reaction: GI UPSET  . Diphenhydramine Other (See Comments)    VITALS:  Blood pressure (!) 149/88, pulse 87, temperature 98 F (36.7 C), temperature source Temporal, resp. rate 16, height 5\' 5"  (1.651 m), weight 72.6 kg, SpO2 94 %.  PHYSICAL EXAMINATION:   Physical Exam  GENERAL:  79 y.o.-year-old patient lying in the bed with no acute distress.  EYES: Pupils equal, round, reactive to light and accommodation. No scleral icterus.   HEENT: Head atraumatic, normocephalic. Oropharynx and nasopharynx clear.   NECK:  Supple, no jugular venous distention. No thyroid enlargement, no tenderness. Neck collar LUNGS: Normal breath sounds bilaterally, no wheezing, rales, rhonchi. No use of accessory muscles of respiration.  CARDIOVASCULAR: S1, S2 normal. No murmurs, rubs, or gallops.  ABDOMEN: Soft, nontender, nondistended. Bowel sounds present. No organomegaly or mass.  EXTREMITIES: No cyanosis, clubbing or edema b/l.    NEUROLOGIC: Cranial nerves II through XII are intact. No focal Motor or sensory deficits b/l.   PSYCHIATRIC:  patient is alert and oriented x 3.  SKIN: No obvious rash, lesion, or ulcer.   LABORATORY PANEL:  CBC Recent Labs  Lab 05/04/19 0610  WBC 14.4*  HGB 12.9  HCT 38.7  PLT 237    Chemistries  Recent Labs  Lab 05/04/19 0610  NA 138  K 3.9  CL 103  CO2 27  GLUCOSE 111*  BUN 19  CREATININE 0.68  CALCIUM 9.0  AST 22  ALT 16  ALKPHOS 65  BILITOT 0.9   Cardiac Enzymes No results for input(s): TROPONINI in the last 168 hours. RADIOLOGY:  DG Chest 2 View  Result Date: 05/03/2019 CLINICAL DATA:  [age]-year-old female with fall and back pain. EXAM: CHEST - 2 VIEW COMPARISON:  Chest radiograph dated 12/05/2016 and chest CT of 02/24/2018. FINDINGS: There is no focal consolidation, pleural effusion, pneumothorax. The cardiac silhouette is within limits. The aorta is slightly tortuous. There is compression fracture of a midthoracic vertebra with anterior wedging and approximately 50% loss of vertebral body height, new since the prior chest CT. Bilateral breast implants noted. IMPRESSION: 1. No acute cardiopulmonary process. 2. Age indeterminate midthoracic compression fracture, new since the prior  studies. Electronically Signed   By: Anner Crete M.D.   On: 05/03/2019 16:24   DG Thoracic Spine 2 View  Result Date: 05/03/2019 CLINICAL DATA:  T2 and T7 point tenderness from fall. EXAM: THORACIC SPINE 2 VIEWS COMPARISON:  CT 02/24/2018 FINDINGS: Subtle curvature of the  thoracic spine convex right. Pedicles are intact. There is a moderate compression fracture over the midthoracic spine likely the T7 level. Age indeterminate, but is new since the previous CT scan. Remainder the exam is unremarkable. IMPRESSION: Moderate compression fracture over the midthoracic spine likely the T7 level, age indeterminate but new since the previous CT scan. Electronically Signed   By: Marin Olp M.D.   On: 05/03/2019 16:27   CT Head Wo Contrast  Addendum Date: 05/03/2019   ADDENDUM REPORT: 05/03/2019 17:14 ADDENDUM: Critical Value/emergent results were called by telephone at the time of interpretation on 05/03/2019 at 5:14 pm to provider [facility] , who verbally acknowledged these results. Electronically Signed   By: Marin Olp M.D.   On: 05/03/2019 17:14   Result Date: 05/03/2019 CLINICAL DATA:  Fall with right occipital hematoma and neck pain. EXAM: CT HEAD WITHOUT CONTRAST CT CERVICAL SPINE WITHOUT CONTRAST TECHNIQUE: Multidetector CT imaging of the head and cervical spine was performed following the standard protocol without intravenous contrast. Multiplanar CT image reconstructions of the cervical spine were also generated. COMPARISON:  Head CT 08/26/2018 FINDINGS: CT HEAD FINDINGS Brain: Ventricles, cisterns and other CSF spaces are normal. There is moderate chronic ischemic microvascular disease. There is no mass, mass effect, shift of midline structures or acute hemorrhage. No evidence of acute infarction. Vascular: No hyperdense vessel or unexpected calcification. Skull: Normal. Negative for fracture or focal lesion. Sinuses/Orbits: No acute finding. Other: None. CT CERVICAL SPINE FINDINGS Alignment: Normal. Skull base and vertebrae: Vertebral body alignment and heights are normal. There is mild spondylosis of the cervical spine to include facet arthropathy and uncovertebral joint spurring. Small Schmorl's node over the superior endplate of C7. Atlantoaxial  articulation is unremarkable. Minimally displaced fracture involving the base of the C2 spinous process extending towards the posterior aspect of the lamina bilaterally. Minimally displaced fracture of the base of the C3 spinous process. Minimally displaced fracture of the tip of the C7 spinous process. Soft tissues and spinal canal: No prevertebral fluid or swelling. No visible canal hematoma. Disc levels:  Normal. Upper chest: Negative. Other: None. IMPRESSION: 1.  No acute brain injury. 2.  Moderate chronic ischemic microvascular disease. 3. Minimally displaced fractures at the base of the spinous processes of C2 and C3 as well as tip of the spinous process of C7. 4.  Mild spondylosis of the cervical spine. Electronically Signed: By: Marin Olp M.D. On: 05/03/2019 16:52   CT Cervical Spine Wo Contrast  Addendum Date: 05/03/2019   ADDENDUM REPORT: 05/03/2019 17:14 ADDENDUM: Critical Value/emergent results were called by telephone at the time of interpretation on 05/03/2019 at 5:14 pm to provider [facility] , who verbally acknowledged these results. Electronically Signed   By: Marin Olp M.D.   On: 05/03/2019 17:14   Result Date: 05/03/2019 CLINICAL DATA:  Fall with right occipital hematoma and neck pain. EXAM: CT HEAD WITHOUT CONTRAST CT CERVICAL SPINE WITHOUT CONTRAST TECHNIQUE: Multidetector CT imaging of the head and cervical spine was performed following the standard protocol without intravenous contrast. Multiplanar CT image reconstructions of the cervical spine were also generated. COMPARISON:  Head CT 08/26/2018 FINDINGS: CT HEAD FINDINGS Brain: Ventricles, cisterns and other CSF spaces  are normal. There is moderate chronic ischemic microvascular disease. There is no mass, mass effect, shift of midline structures or acute hemorrhage. No evidence of acute infarction. Vascular: No hyperdense vessel or unexpected calcification. Skull: Normal. Negative for fracture or focal lesion.  Sinuses/Orbits: No acute finding. Other: None. CT CERVICAL SPINE FINDINGS Alignment: Normal. Skull base and vertebrae: Vertebral body alignment and heights are normal. There is mild spondylosis of the cervical spine to include facet arthropathy and uncovertebral joint spurring. Small Schmorl's node over the superior endplate of C7. Atlantoaxial articulation is unremarkable. Minimally displaced fracture involving the base of the C2 spinous process extending towards the posterior aspect of the lamina bilaterally. Minimally displaced fracture of the base of the C3 spinous process. Minimally displaced fracture of the tip of the C7 spinous process. Soft tissues and spinal canal: No prevertebral fluid or swelling. No visible canal hematoma. Disc levels:  Normal. Upper chest: Negative. Other: None. IMPRESSION: 1.  No acute brain injury. 2.  Moderate chronic ischemic microvascular disease. 3. Minimally displaced fractures at the base of the spinous processes of C2 and C3 as well as tip of the spinous process of C7. 4.  Mild spondylosis of the cervical spine. Electronically Signed: By: Marin Olp M.D. On: 05/03/2019 16:52   CT Thoracic Spine Wo Contrast  Result Date: 05/03/2019 CLINICAL DATA:  Fall. Severe neck and thoracic spine pain with a thoracic compression fracture on chest radiographs. EXAM: CT THORACIC SPINE WITHOUT CONTRAST TECHNIQUE: Multidetector CT images of the thoracic were obtained using the standard protocol without intravenous contrast. COMPARISON:  Chest radiographs 05/03/2019. Chest CT 05/16/2017. FINDINGS: Alignment: Exaggerated upper thoracic kyphosis. No listhesis. Vertebrae: Mildly comminuted T7 vertebral body fracture with involvement of the anterior and middle columns and approximately 50% anterior vertebral body height loss without retropulsion. Chronic right-sided rib deformities. No suspicious osseous lesion. Paraspinal and other soft tissues: Mild swelling/small volume paravertebral  hematoma at T7. Bilateral breast implants. Aortic and coronary artery atherosclerosis. Previous right upper lobectomy. Disc levels: No significant degenerative changes in the thoracic spine. IMPRESSION: Mildly comminuted 2-column T7 vertebral body fracture with 50% height loss. Electronically Signed   By: Logan Bores M.D.   On: 05/03/2019 17:01   ASSESSMENT AND PLAN:   Ahmari Garton is a 79 y.o. female with medical history significant of GERD, osteoporosis, remote lung cancer, depression with anxiety, who presented after a fall down onto the cement at home.  She fell about 3 feet and hit her right occipital head on the cement concrete.    #1 acute T7 compression fracture status post fall: Mechanical in nature.   -will need PT and OT consultation.   -PRN pain meds -consulted Dr.'s menz orthopedic-- patient to get Kyphoplasty today  #2 C-spine and T-spine fractures: Conservative measures only.  She is currently in the neck collar.  Pain management and PT OT -neurosurgery recommendations were obtained  #3 osteoporosis: Continue home regimen  #4 GERD:  Continue PPI.  #5 CKDIII: Stable.   #6 Essential HTN: Uncontrolled.  Resume home regimen and monitor   DVT prophylaxis: Lovenox Code Status: Full code Family Communication:  daughter [name] on the phone Disposition Plan:  patient is from Home  -be determined pending PT, Kyphoplasty - TOC for d/c planning Consults called: orthopedic Dr. Rudene Christians  TOTAL TIME TAKING CARE OF THIS PATIENT: *30* minutes.  >50% time spent on counselling and coordination of care  Note: This dictation was prepared with Dragon dictation along with smaller phrase technology. Any  transcriptional errors that result from this process are unintentional.  Fritzi Mandes M.D    [facility]   CC: Primary care physician; Ezequiel Kayser, MDPatient ID: Christy [name], female   DOB: 04/08/1940, 79 y.o.   MRN: [id number]

## 2019-05-04 NOTE — Anesthesia Preprocedure Evaluation (Signed)
Anesthesia Evaluation  Patient identified by MRN, date of birth, ID band Patient awake    Reviewed: Allergy & Precautions, NPO status , Patient's Chart, lab work & pertinent test results  History of Anesthesia Complications Negative for: history of anesthetic complications  Airway Mallampati: II  TM Distance: >3 FB Neck ROM: Full    Dental  (+) Poor Dentition   Pulmonary neg sleep apnea, neg COPD, former smoker,    breath sounds clear to auscultation- rhonchi (-) wheezing      Cardiovascular hypertension, Pt. on medications (-) CAD, (-) Past MI, (-) Cardiac Stents and (-) CABG  Rhythm:Regular Rate:Normal - Systolic murmurs and - Diastolic murmurs    Neuro/Psych neg Seizures PSYCHIATRIC DISORDERS Anxiety Depression negative neurological ROS     GI/Hepatic GERD  ,Fatty liver    Endo/Other  negative endocrine ROSneg diabetes  Renal/GU Renal InsufficiencyRenal disease     Musculoskeletal  (+) Fibromyalgia -C2,3 and 7 spinous process fractures, in [place]-J collar   Abdominal (+) - obese,   Peds  Hematology negative hematology ROS (+)   Anesthesia Other Findings Past Medical History: No date: Allergy No date: Anxiety No date: Depression No date: Fatty liver disease, nonalcoholic No date: GERD (gastroesophageal reflux disease) No date: Heart disease No date: Hypertension 02/2014: Lung cancer ([place])     Comment:  RUL Lobectomy No date: Urinary incontinence   Reproductive/Obstetrics                             Anesthesia Physical Anesthesia Plan  ASA: III  Anesthesia Plan: General   Post-op Pain Management:    Induction: Intravenous  PONV Risk Score and Plan: 2 and Propofol infusion  Airway Management Planned: Natural Airway  Additional Equipment:   Intra-op Plan:   Post-operative Plan:   Informed Consent: I have reviewed the patients History and Physical, chart, labs and  discussed the procedure including the risks, benefits and alternatives for the proposed anesthesia with the patient or authorized representative who has indicated his/her understanding and acceptance.     Dental advisory given  Plan Discussed with: CRNA and Anesthesiologist  Anesthesia Plan Comments:         Anesthesia Quick Evaluation

## 2019-05-04 NOTE — Transfer of Care (Signed)
Immediate Anesthesia Transfer of Care Note  Patient: Christy [name]  Procedure(s) Performed: Procedure(s): KYPHOPLASTY T7 (N/A)  Patient Location: PACU  Anesthesia Type:General  Level of Consciousness: sedated  Airway & Oxygen Therapy: Patient Spontanous Breathing and Patient connected to face mask oxygen  Post-op Assessment: Report given to RN and Post -op Vital signs reviewed and stable  Post vital signs: Reviewed and stable  Last Vitals:  Vitals:   05/04/19 1136 05/04/19 1316  BP: (!) 149/88 137/63  Pulse: 87 80  Resp: 16 18  Temp: 36.7 C (!) 36.3 C  SpO2: 07% 62%    Complications: No apparent anesthesia complications

## 2019-05-04 NOTE — Progress Notes (Signed)
OT Cancellation Note  Patient Details Name: Chalee Hirota MRN: [id number] DOB: 12/18/1940   Cancelled Treatment:    Reason Eval/Treat Not Completed: Patient at procedure or test/ unavailable  OT consult received and chart reviewed. Pt off floor at kyphoplasty at this time. Will f/u as able/approriate this PM for OT evaluation. Thank you.  Gerrianne Scale, [place], OTR/L ascom [phone] 05/04/19, 11:37 AM

## 2019-05-04 NOTE — Op Note (Signed)
Date 05/04/2019  time 1:20 PM   PATIENT:  Christy [name]   PRE-OPERATIVE DIAGNOSIS:  closed wedge compression fracture of T7   POST-OPERATIVE DIAGNOSIS:  closed wedge compression fracture of T7   PROCEDURE:  Procedure(s): KYPHOPLASTY T7  SURGEON: Laurene Footman, MD   ASSISTANTS: None   ANESTHESIA:   local and MAC   EBL:  No intake/output data recorded.   BLOOD ADMINISTERED:none   DRAINS: none    LOCAL MEDICATIONS USED:  MARCAINE    and XYLOCAINE    SPECIMEN:   T7 vertebral body biopsy   DISPOSITION OF SPECIMEN:  Pathology   COUNTS:  YES   TOURNIQUET:  * No tourniquets in log *   IMPLANTS: Bone cement   DICTATION: .Dragon Dictation  patient was brought to the operating room and after adequate anesthesia was obtained the patient was placed prone.  C arm was brought in in good visualization of the affected level obtained on both AP and lateral projections.  After patient identification and timeout procedures were completed, local anesthetic was infiltrated with 10 cc 1% Xylocaine infiltrated subcutaneously.  This is done the area on the each side of the planned approach.  The back was then prepped and draped in the usual sterile manner and repeat timeout procedure carried out.  A spinal needle was brought down to the pedicle on the right side of  T7 and a 50-50 mix of 1% Xylocaine half percent Sensorcaine with epinephrine total of 20 cc injected.  After allowing this to set a small incision was made and the trocar was advanced into the vertebral body in an extrapedicular fashion.  Biopsy was obtained Drilling was carried out balloon inserted with inflation to  for cc.  When the cement was appropriate consistency 4-3/4 cc were injected into the vertebral body without extravasation, good fill superior to inferior endplates and from right to left sides along the inferior endplate.  After the cement had set the trochar was removed and permanent C-arm views obtained.  The wound was  closed with Dermabond followed by Band-Aid   PLAN OF CARE:  Continue as inpatient   PATIENT DISPOSITION:  PACU - hemodynamically stable.

## 2019-05-04 NOTE — Anesthesia Postprocedure Evaluation (Signed)
Anesthesia Post Note  Patient: Christy [name]  Procedure(s) Performed: KYPHOPLASTY T7 (N/A Back)  Patient location during evaluation: PACU Anesthesia Type: General Level of consciousness: awake and alert and oriented Pain management: pain level controlled Vital Signs Assessment: post-procedure vital signs reviewed and stable Respiratory status: spontaneous breathing, nonlabored ventilation and respiratory function stable Cardiovascular status: blood pressure returned to baseline and stable Postop Assessment: no signs of nausea or vomiting Anesthetic complications: no     Last Vitals:  Vitals:   05/04/19 1345 05/04/19 1408  BP: 127/63 124/64  Pulse: 76 73  Resp: 13 16  Temp: (!) 36.1 C 36.7 C  SpO2: 98% 100%    Last Pain:  Vitals:   05/04/19 1408  TempSrc: Oral  PainSc:                  Cianna Kasparian

## 2019-05-04 NOTE — Evaluation (Signed)
Occupational Therapy Evaluation Patient Details Name: Christy [name] MRN: [id number] DOB: 1940/06/13 Today's Date: 05/04/2019    History of Present Illness Christy [name] is a 79 y.o. female who presents to the emergency department today because of concern for spinal fractures found on work up at urgent care. The patient went to urgent care because of back and neck pain that started after a fall. The patient fell about 3-4 feet down some steps. At urgent care had CT imaging done which showed spinous process fractures of C2,C3, C7 and compression fracture of T7. Pt now with cervical collar and s/p T7 kyphoplasty 05/04/2019.   Clinical Impression   Pt was seen for OT evaluation this date. Prior to hospital admission, pt was Indep with BADLs. Pt lives with spouse and intermittently uses 2WW for fxl mobility. Currently pt demonstrates impairments as described below (See OT problem list) which functionally limit her ability to perform ADL/self-care tasks. Pt currently requires MIN/MOD A with LB dressing and MIN A with bed mobility and ADL transfers with RW.  Pt would benefit from skilled OT to address noted impairments and functional limitations (see below for any additional details) in order to maximize safety and independence while minimizing falls risk and caregiver burden. Upon hospital discharge, recommend STR to maximize pt safety and return to PLOF. Will continue to assess.    Follow Up Recommendations  SNF    Equipment Recommendations  3 in 1 bedside commode;Tub/shower seat    Recommendations for Other Services       Precautions / Restrictions Precautions Precautions: Fall Required Braces or Orthoses: Cervical Brace Cervical Brace: Hard collar;At all times      Mobility Bed Mobility Overal bed mobility: Needs Assistance Bed Mobility: Sidelying to Sit;Sit to Sidelying   Sidelying to sit: Min assist     Sit to sidelying: Min assist    Transfers Overall transfer  level: Needs assistance Equipment used: Rolling walker (2 wheeled) Transfers: Sit to/from Stand Sit to Stand: Min assist              Balance Overall balance assessment: Needs assistance Sitting-balance support: Bilateral upper extremity supported Sitting balance-Leahy Scale: Good   Postural control: Posterior lean   Standing balance-Leahy Scale: Fair Standing balance comment: CGA to MIN A with RW for static stand                           ADL either performed or assessed with clinical judgement   ADL                                         General ADL Comments: Pt requires setup for seated UB ADLs, MIN/MOD A for LB ADLs including donning socks, able to adjust socks with MOD I (bed level, long sitting). MIN A for ADL transfers with RW.     Vision Patient Visual Report: No change from baseline       Perception     Praxis      Pertinent Vitals/Pain Pain Assessment: No/denies pain     Hand Dominance     Extremity/Trunk Assessment Upper Extremity Assessment Upper Extremity Assessment: Overall WFL for tasks assessed(shoulder MMT not assessed d/t cervical fx, grip 4/5 bilaterally)   Lower Extremity Assessment Lower Extremity Assessment: Defer to PT evaluation       Communication Communication Communication: No difficulties  Cognition Arousal/Alertness: Awake/alert Behavior During Therapy: WFL for tasks assessed/performed Overall Cognitive Status: Within Functional Limits for tasks assessed                                 General Comments: some slow processing occasionally, but A&O x4   General Comments       Exercises Other Exercises Other Exercises: OT facilitates education re: safety with OOB activity and safe hand placement with RW. Pt demons moderate reception   Shoulder Instructions      Home Living Family/patient expects to be discharged to:: Private residence Living Arrangements: Spouse/significant  other Available Help at Discharge: Family;Available 24 hours/day Type of Home: House Home Access: Ramped entrance(pt states steep and dangerous)     Home Layout: Two level     Bathroom Shower/Tub: Tub/shower unit;Walk-in shower   Bathroom Toilet: Standard     Home Equipment: Environmental consultant - 2 wheels;Cane - quad;Cane - single point          Prior Functioning/Environment Level of Independence: Independent        Comments: Pt reports driving short distances, reports not always using 2WW. Endorses diffciulty walking at times with some assistance from husband. Endorses 3-4 falls in past year.        OT Problem List: Decreased strength;Decreased range of motion;Decreased activity tolerance;Impaired balance (sitting and/or standing);Decreased coordination;Decreased knowledge of use of DME or AE      OT Treatment/Interventions: Self-care/ADL training;Therapeutic exercise;DME and/or AE instruction;Therapeutic activities;Patient/family education;Balance training    OT Goals(Current goals can be found in the care plan section) Acute Rehab OT Goals Patient Stated Goal: to go home OT Goal Formulation: With patient/family Time For Goal Achievement: 05/18/19 Potential to Achieve Goals: Good  OT Frequency: Min 2X/week   Barriers to D/C:            Co-evaluation              AM-PAC OT "6 Clicks" Daily Activity     Outcome Measure Help from another person eating meals?: None Help from another person taking care of personal grooming?: A Little Help from another person toileting, which includes using toliet, bedpan, or urinal?: A Lot Help from another person bathing (including washing, rinsing, drying)?: A Lot Help from another person to put on and taking off regular upper body clothing?: A Little Help from another person to put on and taking off regular lower body clothing?: A Lot 6 Click Score: 16   End of Session Equipment Utilized During Treatment: Gait belt;Rolling  walker Nurse Communication: Mobility status  Activity Tolerance: Patient tolerated treatment well Patient left: in bed;with call bell/phone within reach;with bed alarm set;with family/visitor present  OT Visit Diagnosis: Unsteadiness on feet (R26.81);Muscle weakness (generalized) (M62.81)                Time: 8786-7672 OT Time Calculation (min): 40 min Charges:  OT General Charges $OT Visit: 1 Visit OT Evaluation $OT Eval Moderate Complexity: 1 Mod OT Treatments $Self Care/Home Management : 8-22 mins $Therapeutic Activity: 8-22 mins  Gerrianne Scale, MS, OTR/L ascom [phone] 05/04/19, 5:03 PM

## 2019-05-04 NOTE — Plan of Care (Signed)

## 2019-05-04 NOTE — TOC Progression Note (Signed)
Transition of Care [facility]) - Progression Note    Patient Details  Name: Christy [name] MRN: [id number] Date of Birth: Jan 24, 1941  [facility]) CM/SW [place], RN Phone Number: 05/04/2019, 1:52 PM  Clinical Narrative:    Patient lives at home with her husband, she fell and has multiple spinal fractures, anticipate the need for SNF at DC, FL2, PASSR, and Bedsearch completed, Insurance auth to be started once the PT eval completed.       Expected Discharge Plan and Services                                                 Social Determinants of Health (SDOH) Interventions    Readmission Risk Interventions No flowsheet data found.

## 2019-05-04 NOTE — Plan of Care (Signed)
Patient s/p Kyphoplasty, returned to room, VSS. Tolerating regular diet and is able to sit in high fowlers position. No signs of bleeding, no c/o pain. Remains on low bed with bed alarm on. Family is at bedside at this moment. PT consult pending.   Problem: Education: Goal: Knowledge of General Education information will improve Description: Including pain rating scale, medication(s)/side effects and non-pharmacologic comfort measures Outcome: Progressing   Problem: Health Behavior/Discharge Planning: Goal: Ability to manage health-related needs will improve Outcome: Progressing   Problem: Clinical Measurements: Goal: Ability to maintain clinical measurements within normal limits will improve Outcome: Progressing   Problem: Activity: Goal: Risk for activity intolerance will decrease Outcome: Progressing   Problem: Nutrition: Goal: Adequate nutrition will be maintained Outcome: Progressing

## 2019-05-04 NOTE — Anesthesia Procedure Notes (Signed)
Date/Time: 05/04/2019 12:35 PM Performed by: Doreen Salvage, CRNA Pre-anesthesia Checklist: Patient identified, Emergency Drugs available, Suction available and Patient being monitored Patient Re-evaluated:Patient Re-evaluated prior to induction Oxygen Delivery Method: Nasal cannula Induction Type: IV induction Dental Injury: Teeth and Oropharynx as per pre-operative assessment  Comments: Nasal cannula with etCO2 monitoring

## 2019-05-05 MED ORDER — PHENOL 1.4 % MT LIQD
1.0000 | OROMUCOSAL | Status: DC | PRN
  Filled 2019-05-05: qty 177

## 2019-05-05 NOTE — Progress Notes (Signed)
[facility] NAME: Christy [name]    MR#:  [id number]  DATE OF BIRTH:  10-12-1940  SUBJECTIVE:  patient had a mechanical fall at home. She fell backwards on a cement/concrete floor and hit her head. Work with physical therapy out in the chair. Daughter at bedside.   REVIEW OF SYSTEMS:   Review of Systems  Constitutional: Negative for chills, fever and weight loss.  HENT: Negative for ear discharge, ear pain and nosebleeds.   Eyes: Negative for blurred vision, pain and discharge.  Respiratory: Negative for sputum production, shortness of breath, wheezing and stridor.   Cardiovascular: Negative for chest pain, palpitations, orthopnea and PND.  Gastrointestinal: Negative for abdominal pain, diarrhea, nausea and vomiting.  Genitourinary: Negative for frequency and urgency.  Musculoskeletal: Positive for back pain, falls and joint pain.  Neurological: Positive for weakness. Negative for sensory change, speech change and focal weakness.  Psychiatric/Behavioral: Negative for depression and hallucinations. The patient is not nervous/anxious.    Tolerating Diet:yes Tolerating PT: SNF  DRUG ALLERGIES:   Allergies  Allergen Reactions  . Alendronate Other (See Comments)    Esophageal burning  . Sulfa Antibiotics Other (See Comments)  . Ciprofloxacin Other (See Comments)    Other Reaction: GI UPSET  . Diphenhydramine Other (See Comments)    VITALS:  Blood pressure (!) 169/81, pulse 79, temperature 98.4 F (36.9 C), temperature source Oral, resp. rate 16, height 5\' 5"  (1.651 m), weight 72.6 kg, SpO2 94 %.  PHYSICAL EXAMINATION:   Physical Exam  GENERAL:  79 y.o.-year-old patient lying in the bed with no acute distress.  EYES: Pupils equal, round, reactive to light and accommodation. No scleral icterus.   HEENT: Head atraumatic, normocephalic. Oropharynx and nasopharynx clear.  NECK:  Supple, no jugular venous distention. No  thyroid enlargement, no tenderness. Neck collar LUNGS: Normal breath sounds bilaterally, no wheezing, rales, rhonchi. No use of accessory muscles of respiration.  CARDIOVASCULAR: S1, S2 normal. No murmurs, rubs, or gallops.  ABDOMEN: Soft, nontender, nondistended. Bowel sounds present. No organomegaly or mass.  EXTREMITIES: No cyanosis, clubbing or edema b/l.    NEUROLOGIC: Cranial nerves II through XII are intact. No focal Motor or sensory deficits b/l.   PSYCHIATRIC:  patient is alert and oriented x 3.  SKIN: No obvious rash, lesion, or ulcer.   LABORATORY PANEL:  CBC Recent Labs  Lab 05/04/19 0610  WBC 14.4*  HGB 12.9  HCT 38.7  PLT 237    Chemistries  Recent Labs  Lab 05/04/19 0610  NA 138  K 3.9  CL 103  CO2 27  GLUCOSE 111*  BUN 19  CREATININE 0.68  CALCIUM 9.0  AST 22  ALT 16  ALKPHOS 65  BILITOT 0.9   Cardiac Enzymes No results for input(s): TROPONINI in the last 168 hours. RADIOLOGY:  DG Chest 2 View  Result Date: 05/03/2019 CLINICAL DATA:  [age]-year-old female with fall and back pain. EXAM: CHEST - 2 VIEW COMPARISON:  Chest radiograph dated 12/05/2016 and chest CT of 02/24/2018. FINDINGS: There is no focal consolidation, pleural effusion, pneumothorax. The cardiac silhouette is within limits. The aorta is slightly tortuous. There is compression fracture of a midthoracic vertebra with anterior wedging and approximately 50% loss of vertebral body height, new since the prior chest CT. Bilateral breast implants noted. IMPRESSION: 1. No acute cardiopulmonary process. 2. Age indeterminate midthoracic compression fracture, new since the prior studies. Electronically Signed   By: Anner Crete  M.D.   On: 05/03/2019 16:24   DG Thoracic Spine 2 View  Result Date: 05/04/2019 CLINICAL DATA:  Kyphoplasty at T7 EXAM: THORACIC SPINE 2 VIEWS; DG C-ARM 1-60 MIN COMPARISON:  Thoracic radiographs May 03, 2019 FLUOROSCOPY TIME:  2 minutes 3 seconds; 2 acquired images  FINDINGS: Frontal and lateral views were obtained intraoperatively over the T7 region. Patient is status post kyphoplasty at T7 with less wedging at T7 compared to precontrast administration. Slight extravasation of contrast is noted posteriorly. Adjacent vertebral bodies appear unremarkable. No spondylolisthesis. Slight disc space narrowing noted at T6-7. IMPRESSION: Contrast administration for kyphoplasty at T7 with slight extravasation posteriorly. Less collapse at T7 currently compared to precontrast images. Nearby vertebral bodies elsewhere appear unremarkable. Electronically Signed   By: Lowella Grip III M.D.   On: 05/04/2019 13:44   DG Thoracic Spine 2 View  Result Date: 05/03/2019 CLINICAL DATA:  T2 and T7 point tenderness from fall. EXAM: THORACIC SPINE 2 VIEWS COMPARISON:  CT 02/24/2018 FINDINGS: Subtle curvature of the thoracic spine convex right. Pedicles are intact. There is a moderate compression fracture over the midthoracic spine likely the T7 level. Age indeterminate, but is new since the previous CT scan. Remainder the exam is unremarkable. IMPRESSION: Moderate compression fracture over the midthoracic spine likely the T7 level, age indeterminate but new since the previous CT scan. Electronically Signed   By: Marin Olp M.D.   On: 05/03/2019 16:27   CT Head Wo Contrast  Addendum Date: 05/03/2019   ADDENDUM REPORT: 05/03/2019 17:14 ADDENDUM: Critical Value/emergent results were called by telephone at the time of interpretation on 05/03/2019 at 5:14 pm to provider [facility] , who verbally acknowledged these results. Electronically Signed   By: Marin Olp M.D.   On: 05/03/2019 17:14   Result Date: 05/03/2019 CLINICAL DATA:  Fall with right occipital hematoma and neck pain. EXAM: CT HEAD WITHOUT CONTRAST CT CERVICAL SPINE WITHOUT CONTRAST TECHNIQUE: Multidetector CT imaging of the head and cervical spine was performed following the standard protocol without  intravenous contrast. Multiplanar CT image reconstructions of the cervical spine were also generated. COMPARISON:  Head CT 08/26/2018 FINDINGS: CT HEAD FINDINGS Brain: Ventricles, cisterns and other CSF spaces are normal. There is moderate chronic ischemic microvascular disease. There is no mass, mass effect, shift of midline structures or acute hemorrhage. No evidence of acute infarction. Vascular: No hyperdense vessel or unexpected calcification. Skull: Normal. Negative for fracture or focal lesion. Sinuses/Orbits: No acute finding. Other: None. CT CERVICAL SPINE FINDINGS Alignment: Normal. Skull base and vertebrae: Vertebral body alignment and heights are normal. There is mild spondylosis of the cervical spine to include facet arthropathy and uncovertebral joint spurring. Small Schmorl's node over the superior endplate of C7. Atlantoaxial articulation is unremarkable. Minimally displaced fracture involving the base of the C2 spinous process extending towards the posterior aspect of the lamina bilaterally. Minimally displaced fracture of the base of the C3 spinous process. Minimally displaced fracture of the tip of the C7 spinous process. Soft tissues and spinal canal: No prevertebral fluid or swelling. No visible canal hematoma. Disc levels:  Normal. Upper chest: Negative. Other: None. IMPRESSION: 1.  No acute brain injury. 2.  Moderate chronic ischemic microvascular disease. 3. Minimally displaced fractures at the base of the spinous processes of C2 and C3 as well as tip of the spinous process of C7. 4.  Mild spondylosis of the cervical spine. Electronically Signed: By: Marin Olp M.D. On: 05/03/2019 16:52   CT Cervical Spine Wo Contrast  Addendum Date: 05/03/2019   ADDENDUM REPORT: 05/03/2019 17:14 ADDENDUM: Critical Value/emergent results were called by telephone at the time of interpretation on 05/03/2019 at 5:14 pm to provider [facility] , who verbally acknowledged these results.  Electronically Signed   By: Marin Olp M.D.   On: 05/03/2019 17:14   Result Date: 05/03/2019 CLINICAL DATA:  Fall with right occipital hematoma and neck pain. EXAM: CT HEAD WITHOUT CONTRAST CT CERVICAL SPINE WITHOUT CONTRAST TECHNIQUE: Multidetector CT imaging of the head and cervical spine was performed following the standard protocol without intravenous contrast. Multiplanar CT image reconstructions of the cervical spine were also generated. COMPARISON:  Head CT 08/26/2018 FINDINGS: CT HEAD FINDINGS Brain: Ventricles, cisterns and other CSF spaces are normal. There is moderate chronic ischemic microvascular disease. There is no mass, mass effect, shift of midline structures or acute hemorrhage. No evidence of acute infarction. Vascular: No hyperdense vessel or unexpected calcification. Skull: Normal. Negative for fracture or focal lesion. Sinuses/Orbits: No acute finding. Other: None. CT CERVICAL SPINE FINDINGS Alignment: Normal. Skull base and vertebrae: Vertebral body alignment and heights are normal. There is mild spondylosis of the cervical spine to include facet arthropathy and uncovertebral joint spurring. Small Schmorl's node over the superior endplate of C7. Atlantoaxial articulation is unremarkable. Minimally displaced fracture involving the base of the C2 spinous process extending towards the posterior aspect of the lamina bilaterally. Minimally displaced fracture of the base of the C3 spinous process. Minimally displaced fracture of the tip of the C7 spinous process. Soft tissues and spinal canal: No prevertebral fluid or swelling. No visible canal hematoma. Disc levels:  Normal. Upper chest: Negative. Other: None. IMPRESSION: 1.  No acute brain injury. 2.  Moderate chronic ischemic microvascular disease. 3. Minimally displaced fractures at the base of the spinous processes of C2 and C3 as well as tip of the spinous process of C7. 4.  Mild spondylosis of the cervical spine. Electronically Signed:  By: Marin Olp M.D. On: 05/03/2019 16:52   CT Thoracic Spine Wo Contrast  Result Date: 05/03/2019 CLINICAL DATA:  Fall. Severe neck and thoracic spine pain with a thoracic compression fracture on chest radiographs. EXAM: CT THORACIC SPINE WITHOUT CONTRAST TECHNIQUE: Multidetector CT images of the thoracic were obtained using the standard protocol without intravenous contrast. COMPARISON:  Chest radiographs 05/03/2019. Chest CT 05/16/2017. FINDINGS: Alignment: Exaggerated upper thoracic kyphosis. No listhesis. Vertebrae: Mildly comminuted T7 vertebral body fracture with involvement of the anterior and middle columns and approximately 50% anterior vertebral body height loss without retropulsion. Chronic right-sided rib deformities. No suspicious osseous lesion. Paraspinal and other soft tissues: Mild swelling/small volume paravertebral hematoma at T7. Bilateral breast implants. Aortic and coronary artery atherosclerosis. Previous right upper lobectomy. Disc levels: No significant degenerative changes in the thoracic spine. IMPRESSION: Mildly comminuted 2-column T7 vertebral body fracture with 50% height loss. Electronically Signed   By: Logan Bores M.D.   On: 05/03/2019 17:01   DG C-Arm 1-60 Min  Result Date: 05/04/2019 CLINICAL DATA:  Kyphoplasty at T7 EXAM: THORACIC SPINE 2 VIEWS; DG C-ARM 1-60 MIN COMPARISON:  Thoracic radiographs May 03, 2019 FLUOROSCOPY TIME:  2 minutes 3 seconds; 2 acquired images FINDINGS: Frontal and lateral views were obtained intraoperatively over the T7 region. Patient is status post kyphoplasty at T7 with less wedging at T7 compared to precontrast administration. Slight extravasation of contrast is noted posteriorly. Adjacent vertebral bodies appear unremarkable. No spondylolisthesis. Slight disc space narrowing noted at T6-7. IMPRESSION: Contrast administration for kyphoplasty at T7 with  slight extravasation posteriorly. Less collapse at T7 currently compared to precontrast  images. Nearby vertebral bodies elsewhere appear unremarkable. Electronically Signed   By: Lowella Grip III M.D.   On: 05/04/2019 13:44   ASSESSMENT AND PLAN:   Rodneisha Bonnet is a 79 y.o. female with medical history significant of GERD, osteoporosis, remote lung cancer, depression with anxiety, who presented after a fall down onto the cement at home.  She fell about 3 feet and hit her right occipital head on the cement concrete.    #1 acute T7 compression fracture status post fall: Mechanical in nature.   -will need PT and OT consultation.   -PRN pain meds -consulted Dr.'s menz orthopedic-- patient to get Kyphoplasty today  #2 C2,3 and 7-spinous process fracture : - Conservative measures only--relayed by admitting MD.   -She is currently in the neck collar.  - Pain management and PT OT -pt will need to f/u neurosurgery as out pt  #3 osteoporosis: - Continue home regimen  #4 GERD:  Continue PPI.  #5 CKDIII: Stable.   #6 Essential HTN: Uncontrolled due to pain -  Resume home regimen and monitor   DVT prophylaxis: Lovenox Code Status: Full code Family Communication:  daughter [name] at bedside Disposition Plan:  patient is from Home   -To Rehab once bed available - TOC for d/c planning Consults called: orthopedic Dr. Rudene Christians  TOTAL TIME TAKING CARE OF THIS PATIENT: *30* minutes.  >50% time spent on counselling and coordination of care  Note: This dictation was prepared with Dragon dictation along with smaller phrase technology. Any transcriptional errors that result from this process are unintentional.  Fritzi Mandes M.D    [facility]   CC: Primary care physician; Ezequiel Kayser, MDPatient ID: Emeline Darling, female   DOB: Jan 26, 1941, 79 y.o.   MRN: [id number]

## 2019-05-05 NOTE — TOC Progression Note (Signed)
Transition of Care [facility]) - Progression Note    Patient Details  Name: Christy [name] MRN: [id number] Date of Birth: 08-04-1940  Transition [facility] CM/SW Contact  Boris Sharper, LCSW Phone [phone] 05/05/2019, 1:55 PM  Clinical Narrative:    CSW notified pt's family of SNF's that she was accepted to, pt's daughter asked for time to look these over and will contact CSW with a bed choice in the morning.  TOC will continue to follow for discharge planning needs.        Expected Discharge Plan and Services                                                 Social Determinants of Health (SDOH) Interventions    Readmission Risk Interventions No flowsheet data found.

## 2019-05-05 NOTE — Progress Notes (Signed)
Physical Therapy Evaluation Patient Details Name: Christy [name] MRN: [id number] DOB: 11/08/1940 Today's Date: 05/05/2019   History of Present Illness  Christy [name] is a 79 y.o. female who presents to the emergency department today because of concern for spinal fractures found on work up at urgent care. The patient went to urgent care because of back and neck pain that started after a fall. The patient fell about 3-4 feet down some steps. At urgent care had CT imaging done which showed spinous process fractures of C2,C3, C7 and compression fracture of T7. Pt now with cervical collar and s/p T7 kyphoplasty 05/04/2019.  Clinical Impression  Pt was seen for mobility and note her struggle with distances with gait, as well as being incontinent of urine.  Pt is somewhat unaware of her limitations, but does ask if she can remove collar.  Assured her that was not to be removed, and pt is going to work toward better standing balance and gait with the device.  Pt is going to be seen for therapy to work on these goals, and will encourage her to progress with regard to spinal precautions.    Follow Up Recommendations SNF    Equipment Recommendations  None recommended by PT(allow SNF to help decide)    Recommendations for Other Services       Precautions / Restrictions Precautions Precautions: Back;Fall Precaution Booklet Issued: No Precaution Comments: reviewed back precautions, does not know them Required Braces or Orthoses: Cervical Brace Cervical Brace: Hard collar;At all times Restrictions Weight Bearing Restrictions: No      Mobility  Bed Mobility Overal bed mobility: Needs Assistance             General bed mobility comments: OOB in chair when PT arrived  Transfers Overall transfer level: Needs assistance Equipment used: Rolling walker (2 wheeled) Transfers: Sit to/from Stand Sit to Stand: Min assist;Mod assist         General transfer comment: min to stand  but mod to assist sitting  Ambulation/Gait Ambulation/Gait assistance: Min assist Gait Distance (Feet): 10 Feet(5 x 2) Assistive device: Rolling walker (2 wheeled);1 person hand held assist Gait Pattern/deviations: Step-through pattern;Shuffle;Wide base of support;Trunk flexed;Decreased stride length Gait velocity: reduced Gait velocity interpretation: <1.31 ft/sec, indicative of household ambulator General Gait Details: guarded and nearly appears to buckle on LE"s  Stairs            Wheelchair Mobility    Modified Rankin (Stroke Patients Only)       Balance Overall balance assessment: Needs assistance Sitting-balance support: Bilateral upper extremity supported;Feet supported Sitting balance-Leahy Scale: Good     Standing balance support: Bilateral upper extremity supported;During functional activity Standing balance-Leahy Scale: Fair Standing balance comment: less than fair dynamically                             Pertinent Vitals/Pain Pain Assessment: Faces Faces Pain Scale: Hurts little more Pain Location: mid back Pain Descriptors / Indicators: Sore Pain Intervention(s): Monitored during session;Premedicated before session;Repositioned;Limited activity within patient's tolerance    Home Living Family/patient expects to be discharged to:: Private residence Living Arrangements: Spouse/significant other Available Help at Discharge: Family;Available 24 hours/day Type of Home: House Home Access: Ramped entrance     Home Layout: Two level Home Equipment: Walker - 2 wheels;Cane - quad;Cane - single point Additional Comments: Pt is unable to climb stairs    Prior Function Level of Independence: Independent  Comments: used RW sporadically and was able to walk and drive with no help previously     Hand Dominance   Dominant Hand: Right    Extremity/Trunk Assessment   Upper Extremity Assessment Upper Extremity Assessment: Overall WFL  for tasks assessed    Lower Extremity Assessment Lower Extremity Assessment: Generalized weakness    Cervical / Trunk Assessment Cervical / Trunk Assessment: Kyphotic  Communication   Communication: No difficulties  Cognition Arousal/Alertness: Awake/alert Behavior During Therapy: WFL for tasks assessed/performed Overall Cognitive Status: Within Functional Limits for tasks assessed                                 General Comments: slow to respond to questions, concerning about her awareness of being in need of using BSC      General Comments General comments (skin integrity, edema, etc.): Pt was seen for mobility of gait and transfers and in chair when PT arrived, removed Purwick and pt immediately lost control of bladder upon standing    Exercises     Assessment/Plan    PT Assessment Patient needs continued PT services  PT Problem List Decreased strength;Decreased range of motion;Decreased activity tolerance;Decreased balance;Decreased mobility;Decreased coordination;Decreased knowledge of use of DME;Decreased safety awareness;Pain       PT Treatment Interventions DME instruction;Gait training;Stair training;Functional mobility training;Therapeutic activities;Therapeutic exercise;Balance training;Neuromuscular re-education;Patient/family education    PT Goals (Current goals can be found in the Care Plan section)  Acute Rehab PT Goals Patient Stated Goal: to go home PT Goal Formulation: With patient Time For Goal Achievement: 05/19/19 Potential to Achieve Goals: Good    Frequency 7X/week   Barriers to discharge Inaccessible home environment;Decreased caregiver support has two levels to home and will not be able to climb stairs    Co-evaluation               AM-PAC PT "6 Clicks" Mobility  Outcome Measure Help needed turning from your back to your side while in a flat bed without using bedrails?: A Little Help needed moving from lying on your back  to sitting on the side of a flat bed without using bedrails?: A Little Help needed moving to and from a bed to a chair (including a wheelchair)?: A Little Help needed standing up from a chair using your arms (e.g., wheelchair or bedside chair)?: A Little Help needed to walk in hospital room?: A Little Help needed climbing 3-5 steps with a railing? : Total 6 Click Score: 16    End of Session Equipment Utilized During Treatment: Gait belt Activity Tolerance: Patient limited by fatigue;Treatment limited secondary to medical complications (Comment) Patient left: in chair;with call bell/phone within reach;with chair alarm set(has pillows to support spine when PT finished) Nurse Communication: Mobility status PT Visit Diagnosis: Unsteadiness on feet (R26.81);Muscle weakness (generalized) (M62.81);Difficulty in walking, not elsewhere classified (R26.2)    Time: 9767-3419 PT Time Calculation (min) (ACUTE ONLY): 36 min   Charges:   PT Evaluation $PT Eval Moderate Complexity: 1 Mod PT Treatments $Gait Training: 8-22 mins        Ramond Dial 05/05/2019, 1:17 PM  Mee Hives, PT MS Acute Rehab Dept. Number: [place] and [place]

## 2019-05-05 NOTE — Plan of Care (Signed)

## 2019-05-05 NOTE — Progress Notes (Addendum)
  Subjective: 1 Day Post-Op Procedure(s) (LRB): KYPHOPLASTY T7 (N/A) Patient seen with daughter in room. Patient states she has no back pain since surgery.   Objective: Vital signs in last 24 hours: Temp:  [97 F (36.1 C)-98.4 F (36.9 C)] 98.4 F (36.9 C) (03/27 0740) Pulse Rate:  [73-90] 79 (03/27 0740) Resp:  [11-18] 16 (03/27 0740) BP: (124-169)/(63-86) 169/81 (03/27 0740) SpO2:  [91 %-100 %] 94 % (03/27 0740)  Intake/Output from previous day:  Intake/Output Summary (Last 24 hours) at 05/05/2019 1256 Last data filed at 05/05/2019 0948 Gross per 24 hour  Intake 300 ml  Output 1000 ml  Net -700 ml    Intake/Output this shift: Total I/O In: 100 [P.O.:100] Out: -   Labs: Recent Labs    05/03/19 2108 05/04/19 0610  HGB 12.9 12.9   Recent Labs    05/03/19 2108 05/04/19 0610  WBC 18.0* 14.4*  RBC 4.25 4.16  HCT 39.8 38.7  PLT 233 237   Recent Labs    05/03/19 2108 05/04/19 0610  NA 137 138  K 3.8 3.9  CL 100 103  CO2 27 27  BUN 24* 19  CREATININE 0.73 0.68  GLUCOSE 139* 111*  CALCIUM 9.3 9.0   No results for input(s): LABPT, INR in the last 72 hours.   EXAM General - Patient is Alert, Appropriate and Oriented Back- nontender to palpation over kyphoplasty site  Dressing/Incision - portal site healing well without signs of infection    Assessment/Plan: 1 Day Post-Op Procedure(s) (LRB): KYPHOPLASTY T7 (N/A) Principal Problem:   Fall Active Problems:   Chronic kidney disease (CKD), stage III (moderate)   Benign essential HTN   Acid reflux   Osteoporosis, post-menopausal   Compression fracture of body of thoracic vertebra (HCC)   Compression fracture of C-spine (HCC)  Estimated body mass index is 26.63 kg/m as calculated from the following:   Height as of this encounter: 5\' 5"  (1.651 m).   Weight as of this encounter: 72.6 kg. Advance diet  Fresh bandage applied.  Patient to follow-up with [facility] in 2 weeks after discharge.  Cassell Smiles, PA-C [facility] 05/05/2019, 12:56 PM

## 2019-05-06 NOTE — TOC Progression Note (Signed)
Transition of Care [facility]) - Progression Note    Patient Details  Name: Anupama Piehl MRN: [id number] Date of Birth: 1940-04-24  [facility]) CM/SW Contact  Boris Sharper, LCSW Phone Number:[phone] 05/06/2019, 11:36 AM  Clinical Narrative:    Pt selected [facility] as SNF choice. CSW called and spoke with Magda Paganini at [facility] to confirm bed availability. CSW began authorization with [facility] health and faxed clinicals. Ref number [id number].  TOC will continue to follow for discharge planning needs.          Expected Discharge Plan and Services                                                 Social Determinants of Health (SDOH) Interventions    Readmission Risk Interventions No flowsheet data found.

## 2019-05-06 NOTE — Progress Notes (Signed)
Physical Therapy Treatment Patient Details Name: Christy [name] MRN: [id number] DOB: [birth year] Today's Date: 05/06/2019    History of Present Illness Christy [name] is a 79 y.o. female who presents to the emergency department today because of concern for spinal fractures found on work up at urgent care. The patient went to urgent care because of back and neck pain that started after a fall. The patient fell about 3-4 feet down some steps. At urgent care had CT imaging done which showed spinous process fractures of C2,C3, C7 and compression fracture of T7. Pt now with cervical collar and s/p T7 kyphoplasty 05/04/2019.    PT Comments    Pt was seen for mobility of gait and noted her struggle with incontinence even after trying to use purwick before OOB, to sit on Commode initially and then was unable to control bladder after a 40' walk.  Will need to consider regular toileting to manage this issue, and in the time of PT session continue to encourage her to try to empty her bladder for the session.   Follow Up Recommendations  SNF     Equipment Recommendations  None recommended by PT    Recommendations for Other Services       Precautions / Restrictions Precautions Precautions: Back;Fall Precaution Booklet Issued: No Precaution Comments: reviewed back precautions, does not know them Required Braces or Orthoses: Cervical Brace Cervical Brace: Hard collar;At all times Restrictions Weight Bearing Restrictions: No    Mobility  Bed Mobility Overal bed mobility: Needs Assistance Bed Mobility: Supine to Sit;Sit to Supine   Sidelying to sit: Min assist     Sit to sidelying: Min assist General bed mobility comments: help to lift legs onto bed and to support trunk off the bed  Transfers Overall transfer level: Needs assistance Equipment used: Rolling walker (2 wheeled);1 person hand held assist Transfers: Sit to/from Stand Sit to Stand: Min assist;Mod assist          General transfer comment: mod to power up from lower height  Ambulation/Gait Ambulation/Gait assistance: Min guard Gait Distance (Feet): 40 Feet Assistive device: Rolling walker (2 wheeled);1 person hand held assist Gait Pattern/deviations: Step-through pattern;Decreased stride length;Wide base of support;Trunk flexed Gait velocity: reduced Gait velocity interpretation: <1.31 ft/sec, indicative of household ambulator General Gait Details: cues for safety, pt had urine incontinence with gait   Stairs             Wheelchair Mobility    Modified Rankin (Stroke Patients Only)       Balance                                            Cognition Arousal/Alertness: Awake/alert Behavior During Therapy: WFL for tasks assessed/performed Overall Cognitive Status: Within Functional Limits for tasks assessed                                 General Comments: slow to respond to questions, concerning about her awareness of being in need of using [facility]      Exercises      General Comments General comments (skin integrity, edema, etc.): pt is asked to use purwick before getting up but after a short walk still was incontinent even after sitting on [facility]      Pertinent Vitals/Pain Pain Assessment: Faces Faces Pain  Scale: Hurts little more Pain Location: mid back Pain Descriptors / Indicators: Sore Pain Intervention(s): Limited activity within patient's tolerance;Monitored during session;Repositioned    Home Living                      Prior Function            PT Goals (current goals can now be found in the care plan section) Acute Rehab PT Goals Patient Stated Goal: to go home Progress towards PT goals: Progressing toward goals    Frequency    7X/week      PT Plan Current plan remains appropriate    Co-evaluation              AM-PAC PT "6 Clicks" Mobility   Outcome Measure  Help needed turning from your back to  your side while in a flat bed without using bedrails?: A Little Help needed moving from lying on your back to sitting on the side of a flat bed without using bedrails?: A Little Help needed moving to and from a bed to a chair (including a wheelchair)?: A Little Help needed standing up from a chair using your arms (e.g., wheelchair or bedside chair)?: A Lot Help needed to walk in hospital room?: A Little Help needed climbing 3-5 steps with a railing? : A Lot 6 Click Score: 16    End of Session Equipment Utilized During Treatment: Gait belt Activity Tolerance: Patient limited by fatigue;Treatment limited secondary to medical complications (Comment) Patient left: in bed;with call bell/phone within reach;with bed alarm set Nurse Communication: Mobility status PT Visit Diagnosis: Unsteadiness on feet (R26.81);Muscle weakness (generalized) (M62.81);Difficulty in walking, not elsewhere classified (R26.2)     Time: 2585-2778 PT Time Calculation (min) (ACUTE ONLY): 31 min  Charges:  $Gait Training: 8-22 mins $Therapeutic Activity: 8-22 mins                    Ramond Dial 05/06/2019, 3:40 PM  Mee Hives, PT MS Acute Rehab Dept. Number: [place] and [place]

## 2019-05-06 NOTE — Progress Notes (Signed)
[facility] at [place] NAME: Christy [name]    MR#:  [id number]  DATE OF BIRTH:  08/26/1940  SUBJECTIVE:  patient had a mechanical fall at home. She fell backwards on a cement/concrete floor and hit her head. Patient doing well with physical therapy. New complaints today.  REVIEW OF SYSTEMS:   Review of Systems  Constitutional: Negative for chills, fever and weight loss.  HENT: Negative for ear discharge, ear pain and nosebleeds.   Eyes: Negative for blurred vision, pain and discharge.  Respiratory: Negative for sputum production, shortness of breath, wheezing and stridor.   Cardiovascular: Negative for chest pain, palpitations, orthopnea and PND.  Gastrointestinal: Negative for abdominal pain, diarrhea, nausea and vomiting.  Genitourinary: Negative for frequency and urgency.  Musculoskeletal: Positive for back pain, falls and joint pain.  Neurological: Positive for weakness. Negative for sensory change, speech change and focal weakness.  Psychiatric/Behavioral: Negative for depression and hallucinations. The patient is not nervous/anxious.    Tolerating Diet:yes Tolerating PT: SNF  DRUG ALLERGIES:   Allergies  Allergen Reactions  . Alendronate Other (See Comments)    Esophageal burning  . Sulfa Antibiotics Other (See Comments)  . Ciprofloxacin Other (See Comments)    Other Reaction: GI UPSET  . Diphenhydramine Other (See Comments)    VITALS:  Blood pressure (!) 152/74, pulse 88, temperature 98 F (36.7 C), resp. rate 16, height 5\' 5"  (1.651 m), weight 72.6 kg, SpO2 100 %.  PHYSICAL EXAMINATION:   Physical Exam  GENERAL:  79 y.o.-year-old patient lying in the bed with no acute distress.  EYES: Pupils equal, round, reactive to light and accommodation. No scleral icterus.   HEENT: Head atraumatic, normocephalic. Oropharynx and nasopharynx clear.  NECK:  Supple, no jugular venous distention. No thyroid enlargement, no  tenderness. Neck collar LUNGS: Normal breath sounds bilaterally, no wheezing, rales, rhonchi. No use of accessory muscles of respiration.  CARDIOVASCULAR: S1, S2 normal. No murmurs, rubs, or gallops.  ABDOMEN: Soft, nontender, nondistended. Bowel sounds present. No organomegaly or mass.  EXTREMITIES: No cyanosis, clubbing or edema b/l.    NEUROLOGIC: Cranial nerves II through XII are intact. No focal Motor or sensory deficits b/l.   PSYCHIATRIC:  patient is alert and oriented x 3.  SKIN: No obvious rash, lesion, or ulcer.   LABORATORY PANEL:  CBC Recent Labs  Lab 05/04/19 0610  WBC 14.4*  HGB 12.9  HCT 38.7  PLT 237    Chemistries  Recent Labs  Lab 05/04/19 0610  NA 138  K 3.9  CL 103  CO2 27  GLUCOSE 111*  BUN 19  CREATININE 0.68  CALCIUM 9.0  AST 22  ALT 16  ALKPHOS 65  BILITOT 0.9   Cardiac Enzymes No results for input(s): TROPONINI in the last 168 hours. RADIOLOGY:  DG Thoracic Spine 2 View  Result Date: 05/04/2019 CLINICAL DATA:  Kyphoplasty at T7 EXAM: THORACIC SPINE 2 VIEWS; DG C-ARM 1-60 MIN COMPARISON:  Thoracic radiographs May 03, 2019 FLUOROSCOPY TIME:  2 minutes 3 seconds; 2 acquired images FINDINGS: Frontal and lateral views were obtained intraoperatively over the T7 region. Patient is status post kyphoplasty at T7 with less wedging at T7 compared to precontrast administration. Slight extravasation of contrast is noted posteriorly. Adjacent vertebral bodies appear unremarkable. No spondylolisthesis. Slight disc space narrowing noted at T6-7. IMPRESSION: Contrast administration for kyphoplasty at T7 with slight extravasation posteriorly. Less collapse at T7 currently compared to precontrast images. Nearby vertebral bodies elsewhere appear  unremarkable. Electronically Signed   By: Lowella Grip III M.D.   On: 05/04/2019 13:44   DG C-Arm 1-60 Min  Result Date: 05/04/2019 CLINICAL DATA:  Kyphoplasty at T7 EXAM: THORACIC SPINE 2 VIEWS; DG C-ARM 1-60 MIN  COMPARISON:  Thoracic radiographs May 03, 2019 FLUOROSCOPY TIME:  2 minutes 3 seconds; 2 acquired images FINDINGS: Frontal and lateral views were obtained intraoperatively over the T7 region. Patient is status post kyphoplasty at T7 with less wedging at T7 compared to precontrast administration. Slight extravasation of contrast is noted posteriorly. Adjacent vertebral bodies appear unremarkable. No spondylolisthesis. Slight disc space narrowing noted at T6-7. IMPRESSION: Contrast administration for kyphoplasty at T7 with slight extravasation posteriorly. Less collapse at T7 currently compared to precontrast images. Nearby vertebral bodies elsewhere appear unremarkable. Electronically Signed   By: Lowella Grip III M.D.   On: 05/04/2019 13:44   ASSESSMENT AND PLAN:   Christy [name] is a 79 y.o. female with medical history significant of GERD, osteoporosis, remote lung cancer, depression with anxiety, who presented after a fall down onto the cement at home.  She fell about 3 feet and hit her right occipital head on the cement concrete.    #1 acute T7 compression fracture status post fall: Mechanical in nature.   -will need PT and OT consultation.   -PRN pain meds -consulted Dr.'s [facility]--pt is s/p Kyphoplasty T7  #2 C2,3 and 7-spinous process fracture : - Conservative measures only--relayed by admitting MD.   -She is currently in the neck collar.  - Pain management and PT OT -pt will need to f/u neurosurgery as out pt-- please make appointment with [facility] follow-up as outpatient.  #3 osteoporosis: - Continue home regimen  #4 GERD:  Continue PPI.  #5 CKDIII: Stable.   #6 Essential HTN: Uncontrolled due to pain -Resume home regimen and monitor   DVT prophylaxis: Lovenox Code Status: Full code Family Communication:  daughter Christy [name]  Disposition Plan:  patient is from Home   -To Rehab -- [facility] once insurance authorization is completed -  TOC for d/c planning Consults called: orthopedic Dr. Rudene Christians  TOTAL TIME TAKING CARE OF THIS PATIENT: *30* minutes.  >50% time spent on counselling and coordination of care  Note: This dictation was prepared with Dragon dictation along with smaller phrase technology. Any transcriptional errors that result from this process are unintentional.  Fritzi Mandes M.D    [facility]   CC: Primary care physician; Ezequiel Kayser, MDPatient ID: Emeline Darling, female   DOB: Jun 14, 1940, 79 y.o.   MRN: [id number]

## 2019-05-06 NOTE — Plan of Care (Signed)
  Problem: Clinical Measurements: Goal: Ability to maintain clinical measurements within normal limits will improve Outcome: Progressing Goal: Will remain free from infection Outcome: Progressing Goal: Diagnostic test results will improve Outcome: Progressing Goal: Respiratory complications will improve Outcome: Progressing Goal: Cardiovascular complication will be avoided Outcome: Progressing   Problem: Activity: Goal: Risk for activity intolerance will decrease Outcome: Progressing   Problem: Coping: Goal: Level of anxiety will decrease Outcome: Progressing   Problem: Pain Managment: Goal: General experience of comfort will improve Outcome: Progressing   

## 2019-05-07 DIAGNOSIS — S22000A Wedge compression fracture of unspecified thoracic vertebra, initial encounter for closed fracture: Secondary | ICD-10-CM

## 2019-05-07 DIAGNOSIS — S12190D Other displaced fracture of second cervical vertebra, subsequent encounter for fracture with routine healing: Secondary | ICD-10-CM

## 2019-05-07 DIAGNOSIS — I10 Essential (primary) hypertension: Secondary | ICD-10-CM

## 2019-05-07 LAB — SARS CORONAVIRUS 2 (TAT 6-24 HRS): SARS Coronavirus 2: NEGATIVE

## 2019-05-07 LAB — SURGICAL PATHOLOGY

## 2019-05-07 MED ORDER — HYDROCODONE-ACETAMINOPHEN 5-325 MG PO TABS: 2.0000 | ORAL_TABLET | Freq: Four times a day (QID) | ORAL | 0 refills | Status: DC | PRN

## 2019-05-07 MED ORDER — ENOXAPARIN SODIUM 40 MG/0.4ML ~~LOC~~ SOLN
40.0000 mg | SUBCUTANEOUS | Status: DC
  Administered 2019-05-07: 40 mg via SUBCUTANEOUS
  Filled 2019-05-07: qty 0.4

## 2019-05-07 MED ORDER — POLYETHYLENE GLYCOL 3350 17 G PO PACK: 17.0000 g | PACK | Freq: Two times a day (BID) | ORAL | 0 refills | Status: DC

## 2019-05-07 MED ORDER — SENNOSIDES-DOCUSATE SODIUM 8.6-50 MG PO TABS: 2.0000 | ORAL_TABLET | Freq: Every day | ORAL | 0 refills | Status: DC | PRN

## 2019-05-07 MED ORDER — BISACODYL 10 MG RE SUPP
10.0000 mg | Freq: Every day | RECTAL | Status: DC
  Administered 2019-05-07: 10 mg via RECTAL
  Filled 2019-05-07: qty 1

## 2019-05-07 MED ORDER — POLYETHYLENE GLYCOL 3350 17 G PO PACK
17.0000 g | PACK | Freq: Two times a day (BID) | ORAL | Status: DC
  Administered 2019-05-07: 17 g via ORAL
  Filled 2019-05-07 (×2): qty 1

## 2019-05-07 NOTE — Plan of Care (Signed)
  Problem: Education: Goal: Knowledge of General Education information will improve Description: Including pain rating scale, medication(s)/side effects and non-pharmacologic comfort measures Outcome: Progressing   Problem: Clinical Measurements: Goal: Will remain free from infection Outcome: Progressing Note: No s/s of infection noted this shift. Goal: Cardiovascular complication will be avoided Outcome: Progressing

## 2019-05-07 NOTE — Progress Notes (Signed)
Pt discharged to [facility] via EMS.

## 2019-05-07 NOTE — Progress Notes (Signed)
Physical Therapy Treatment Patient Details Name: Christy [name] MRN: [id number] DOB: 1940-07-30 Today's Date: 05/07/2019    History of Present Illness Christy [name] is a 79 y.o. female who presents to the emergency department today because of concern for spinal fractures found on work up at urgent care. The patient went to urgent care because of back and neck pain that started after a fall. The patient fell about 3-4 feet down some steps. At urgent care had CT imaging done which showed spinous process fractures of C2,C3, C7 and compression fracture of T7. Pt now with cervical collar and s/p T7 kyphoplasty 05/04/2019.    PT Comments    Pt resting in bed upon PT arrival; c-collar in place.  Purewick removed to place brief on pt but pt incontinent of urine prior to being able to finish placing brief on so NT called and assisted with clean-up and new clean brief then donned.  Bed mobility min to mod assist via logrolling; transfers with min assist (pt initially appearing shaky with standing but improved on 2nd trial standing); and CGA for ambulation 70 feet with RW.  Briefed doffed end of session and nurse present to assist with repositioning pt in bed and also to replace [place].  Will continue to focus on strengthening and progressive functional mobility during hospitalization.    Follow Up Recommendations  SNF     Equipment Recommendations  Rolling walker with 5" wheels    Recommendations for Other Services       Precautions / Restrictions Precautions Precautions: Back;Fall;Cervical Precaution Comments: spinal precautions Required Braces or Orthoses: Cervical Brace Cervical Brace: Hard collar;At all times Restrictions Weight Bearing Restrictions: No    Mobility  Bed Mobility Overal bed mobility: Needs Assistance Bed Mobility: Rolling;Sidelying to Sit;Sit to Sidelying Rolling: Min assist;Mod assist(logrolling L and R in bed multiple times to donn/doff brief and change  linens d/t urinary incontinence) Sidelying to sit: Min assist;Mod assist;HOB elevated     Sit to sidelying: Mod assist General bed mobility comments: vc's for logrolling technique; assist for trunk > LE's L sidelying to sitting and assist for LE's > trunk sit to L sidelying  Transfers Overall transfer level: Needs assistance Equipment used: Rolling walker (2 wheeled) Transfers: Sit to/from Stand Sit to Stand: Min assist         General transfer comment: x2 trials standing from bed up to RW; vc's for UE placement to assist with standing; vc's for feeling LE's against bed and reaching back prior to sitting  Ambulation/Gait Ambulation/Gait assistance: Min guard Gait Distance (Feet): 70 Feet Assistive device: Rolling walker (2 wheeled)   Gait velocity: decreased   General Gait Details: decreased B LE step length/foot clearance/heelstrike   Stairs             Wheelchair Mobility    Modified Rankin (Stroke Patients Only)       Balance Overall balance assessment: Needs assistance Sitting-balance support: No upper extremity supported;Feet supported   Sitting balance - Comments: steady sitting reaching within BOS   Standing balance support: Bilateral upper extremity supported;During functional activity Standing balance-Leahy Scale: Fair Standing balance comment: requiring B UE support on RW for ambulation                            Cognition Arousal/Alertness: Awake/alert Behavior During Therapy: WFL for tasks assessed/performed Overall Cognitive Status: Within Functional Limits for tasks assessed  Exercises      General Comments   Nursing cleared pt for participation in physical therapy.  Pt agreeable to PT session.  Pt's daughter present during session.      Pertinent Vitals/Pain Pain Assessment: No/denies pain Pain Intervention(s): Limited activity within patient's tolerance;Monitored  during session;Repositioned;Premedicated before session  Vitals (HR and O2 on room air)stable and WFL throughout treatment session.    Home Living                      Prior Function            PT Goals (current goals can now be found in the care plan section) Acute Rehab PT Goals Patient Stated Goal: to go home PT Goal Formulation: With patient Time For Goal Achievement: 05/19/19 Potential to Achieve Goals: Good Progress towards PT goals: Progressing toward goals    Frequency    7X/week      PT Plan Current plan remains appropriate    Co-evaluation              AM-PAC PT "6 Clicks" Mobility   Outcome Measure  Help needed turning from your back to your side while in a flat bed without using bedrails?: A Little Help needed moving from lying on your back to sitting on the side of a flat bed without using bedrails?: A Lot Help needed moving to and from a bed to a chair (including a wheelchair)?: A Little Help needed standing up from a chair using your arms (e.g., wheelchair or bedside chair)?: A Little Help needed to walk in hospital room?: A Little Help needed climbing 3-5 steps with a railing? : A Lot 6 Click Score: 16    End of Session Equipment Utilized During Treatment: Gait belt Activity Tolerance: Patient limited by fatigue Patient left: in bed;with call bell/phone within reach;with bed alarm set;with nursing/sitter in room;Other (comment)(Nurse present and reported she would lower pt's bed down once done with COVID swab.) Nurse Communication: Mobility status;Precautions(Nurse replaced purewick end of PT session) PT Visit Diagnosis: Unsteadiness on feet (R26.81);Muscle weakness (generalized) (M62.81);Difficulty in walking, not elsewhere classified (R26.2)     Time: 5366-4403 PT Time Calculation (min) (ACUTE ONLY): 38 min  Charges:  $Gait Training: 8-22 mins $Therapeutic Activity: 23-37 mins                     Leitha Bleak, PT 05/07/19,  2:21 PM

## 2019-05-07 NOTE — Progress Notes (Signed)
Report given to [facility], no questions. Patient to be transported by EMS. Christy [name]

## 2019-05-07 NOTE — Discharge Summary (Signed)
Physician Discharge Summary  Christy [name] [facility] GBE:[id number] DOB: Mar 22, 1940 DOA: 05/03/2019  PCP: Ezequiel Kayser, MD  Admit date: 05/03/2019 Discharge date: 05/07/2019  Admitted From: Home Disposition: SNF  Recommendations for Outpatient Follow-up:  Follow-up with MD at SNF in 1 week Appointment made to follow-up with [facility] on 05/14/2019 at 1 PM ([place]. clinic Monday/Wednesday).  Home Health: As per therapy Equipment/Devices: Cervical collar  Discharge Condition: Fair CODE STATUS: Full code Diet recommendation: Regular    Discharge Diagnoses:  Principal Problem:   Compression fracture of C-spine ([place])   Active Problems:   Chronic kidney disease (CKD), stage III (moderate)   Benign essential HTN   Acid reflux   Osteoporosis, post-menopausal   Fall   Compression fracture of body of thoracic vertebra ([place])  Brief narrative/HPI 79 y.o.femalewith medical history significant ofGERD, osteoporosis, remote lung cancer, depression with anxiety, who presented after a fall down onto the cement at home. She fell about 3 feet and hit her right occipital head on the cement concrete. CT head and cervical spine showed minimally displaced fracture involving the base of C2 spinous process, C3 spinous process and tip of the C7 spinous process.  A CT of the thoracic spine showed mildly comminuted T7 vertebral body fracture with 50% height loss.  Hospital course  Principal problem  C2, C3 and 7 spinous process fracture Secondary to mechanical fall as well.  Cervical collar applied.  Pain control with Percocet as needed.  Added aggressive bowel regimen.  PT recommends SNF.  She will be discharged in a cervical collar.  Have made appointment with [facility] on 05/14/2019.   Active problems Acute T7 compression fracture Secondary to mechanical fall.  Underwent kyphoplasty of T7 on 3/26.  Appreciate orthopedics consult.  Pain control as  below.   Osteoporosis Continue home meds  GERD Continue PPI  CKD stage IIIa Stable.  Essential hypertension Stable.  Continue benazepril.  Procedure: CT head and cervical spine, CT thoracic spine Family communication: None Consults: Orthopedics  Disposition: SNF  Discharge Instructions   Allergies as of 05/07/2019      Reactions   Alendronate Other (See Comments)   Esophageal burning   Sulfa Antibiotics Other (See Comments)   Ciprofloxacin Other (See Comments)   Other Reaction: GI UPSET   Diphenhydramine Other (See Comments)      Medication List    STOP taking these medications   multivitamin tablet     TAKE these medications   benazepril 5 MG tablet Commonly known as: LOTENSIN Take 1 tablet by mouth daily.   calcium citrate-vitamin D 500-400 MG-UNIT chewable tablet Chew 1 tablet by mouth daily.   DULoxetine 20 MG capsule Commonly known as: CYMBALTA Take 1 capsule by mouth daily.   estradiol 1 MG tablet Commonly known as: ESTRACE Take 1 mg by mouth daily.   HYDROcodone-acetaminophen 5-325 MG tablet Commonly known as: NORCO/VICODIN Take 2 tablets by mouth every 6 (six) hours as needed for moderate pain.   nortriptyline 10 MG capsule Commonly known as: PAMELOR Take 20 mg by mouth at bedtime.   OCUVITE ADULT 50+ PO Take 1 tablet by mouth daily.   polyethylene glycol 17 g packet Commonly known as: MIRALAX / GLYCOLAX Take 17 g by mouth 2 (two) times daily.   senna-docusate 8.6-50 MG tablet Commonly known as: Senokot-S Take 2 tablets by mouth daily as needed for mild constipation.   vitamin B-12 1000 MCG tablet Commonly known as: CYANOCOBALAMIN Take 1,000  mcg by mouth daily.       Contact information for follow-up providers    Duanne Guess, PA-C Follow up in 2 week(s).   Specialties: Orthopedic Surgery, Emergency Medicine Contact information: [place] [address] [phone]        Pearletha Furl,  PA-C Follow up on 05/14/2019.   Specialty: Physician Assistant Why: 1:30 pm @ [facility], clinic [name] [address]  Contact information: [place] [address] [phone]            Contact information for after-discharge care    [facility] .   Service: Skilled Nursing Contact information: [place] [phone]                 Allergies  Allergen Reactions  . Alendronate Other (See Comments)    Esophageal burning  . Sulfa Antibiotics Other (See Comments)  . Ciprofloxacin Other (See Comments)    Other Reaction: GI UPSET  . Diphenhydramine Other (See Comments)     Procedures/Studies: DG Chest 2 View  Result Date: 05/03/2019 CLINICAL DATA:  [age]-year-old female with fall and back pain. EXAM: CHEST - 2 VIEW COMPARISON:  Chest radiograph dated 12/05/2016 and chest CT of 02/24/2018. FINDINGS: There is no focal consolidation, pleural effusion, pneumothorax. The cardiac silhouette is within limits. The aorta is slightly tortuous. There is compression fracture of a midthoracic vertebra with anterior wedging and approximately 50% loss of vertebral body height, new since the prior chest CT. Bilateral breast implants noted. IMPRESSION: 1. No acute cardiopulmonary process. 2. Age indeterminate midthoracic compression fracture, new since the prior studies. Electronically Signed   By: Anner Crete M.D.   On: 05/03/2019 16:24   DG Thoracic Spine 2 View  Result Date: 05/04/2019 CLINICAL DATA:  Kyphoplasty at T7 EXAM: THORACIC SPINE 2 VIEWS; DG C-ARM 1-60 MIN COMPARISON:  Thoracic radiographs May 03, 2019 FLUOROSCOPY TIME:  2 minutes 3 seconds; 2 acquired images FINDINGS: Frontal and lateral views were obtained intraoperatively over the T7 region. Patient is status post kyphoplasty at T7 with less wedging at T7 compared to precontrast administration. Slight  extravasation of contrast is noted posteriorly. Adjacent vertebral bodies appear unremarkable. No spondylolisthesis. Slight disc space narrowing noted at T6-7. IMPRESSION: Contrast administration for kyphoplasty at T7 with slight extravasation posteriorly. Less collapse at T7 currently compared to precontrast images. Nearby vertebral bodies elsewhere appear unremarkable. Electronically Signed   By: Lowella Grip III M.D.   On: 05/04/2019 13:44   DG Thoracic Spine 2 View  Result Date: 05/03/2019 CLINICAL DATA:  T2 and T7 point tenderness from fall. EXAM: THORACIC SPINE 2 VIEWS COMPARISON:  CT 02/24/2018 FINDINGS: Subtle curvature of the thoracic spine convex right. Pedicles are intact. There is a moderate compression fracture over the midthoracic spine likely the T7 level. Age indeterminate, but is new since the previous CT scan. Remainder the exam is unremarkable. IMPRESSION: Moderate compression fracture over the midthoracic spine likely the T7 level, age indeterminate but new since the previous CT scan. Electronically Signed   By: Marin Olp M.D.   On: 05/03/2019 16:27   CT Head Wo Contrast  Addendum Date: 05/03/2019   ADDENDUM REPORT: 05/03/2019 17:14 ADDENDUM: Critical Value/emergent results were called by telephone at the time of interpretation on 05/03/2019 at 5:14 pm to provider [facility] , who verbally acknowledged these results. Electronically Signed   By: Quillian Quince  Derrel Nip M.D.   On: 05/03/2019 17:14   Result Date: 05/03/2019 CLINICAL DATA:  Fall with right occipital hematoma and neck pain. EXAM: CT HEAD WITHOUT CONTRAST CT CERVICAL SPINE WITHOUT CONTRAST TECHNIQUE: Multidetector CT imaging of the head and cervical spine was performed following the standard protocol without intravenous contrast. Multiplanar CT image reconstructions of the cervical spine were also generated. COMPARISON:  Head CT 08/26/2018 FINDINGS: CT HEAD FINDINGS Brain: Ventricles, cisterns and other CSF  spaces are normal. There is moderate chronic ischemic microvascular disease. There is no mass, mass effect, shift of midline structures or acute hemorrhage. No evidence of acute infarction. Vascular: No hyperdense vessel or unexpected calcification. Skull: Normal. Negative for fracture or focal lesion. Sinuses/Orbits: No acute finding. Other: None. CT CERVICAL SPINE FINDINGS Alignment: Normal. Skull base and vertebrae: Vertebral body alignment and heights are normal. There is mild spondylosis of the cervical spine to include facet arthropathy and uncovertebral joint spurring. Small Schmorl's node over the superior endplate of C7. Atlantoaxial articulation is unremarkable. Minimally displaced fracture involving the base of the C2 spinous process extending towards the posterior aspect of the lamina bilaterally. Minimally displaced fracture of the base of the C3 spinous process. Minimally displaced fracture of the tip of the C7 spinous process. Soft tissues and spinal canal: No prevertebral fluid or swelling. No visible canal hematoma. Disc levels:  Normal. Upper chest: Negative. Other: None. IMPRESSION: 1.  No acute brain injury. 2.  Moderate chronic ischemic microvascular disease. 3. Minimally displaced fractures at the base of the spinous processes of C2 and C3 as well as tip of the spinous process of C7. 4.  Mild spondylosis of the cervical spine. Electronically Signed: By: Marin Olp M.D. On: 05/03/2019 16:52   CT Cervical Spine Wo Contrast  Addendum Date: 05/03/2019   ADDENDUM REPORT: 05/03/2019 17:14 ADDENDUM: Critical Value/emergent results were called by telephone at the time of interpretation on 05/03/2019 at 5:14 pm to provider [facility] , who verbally acknowledged these results. Electronically Signed   By: Marin Olp M.D.   On: 05/03/2019 17:14   Result Date: 05/03/2019 CLINICAL DATA:  Fall with right occipital hematoma and neck pain. EXAM: CT HEAD WITHOUT CONTRAST CT CERVICAL  SPINE WITHOUT CONTRAST TECHNIQUE: Multidetector CT imaging of the head and cervical spine was performed following the standard protocol without intravenous contrast. Multiplanar CT image reconstructions of the cervical spine were also generated. COMPARISON:  Head CT 08/26/2018 FINDINGS: CT HEAD FINDINGS Brain: Ventricles, cisterns and other CSF spaces are normal. There is moderate chronic ischemic microvascular disease. There is no mass, mass effect, shift of midline structures or acute hemorrhage. No evidence of acute infarction. Vascular: No hyperdense vessel or unexpected calcification. Skull: Normal. Negative for fracture or focal lesion. Sinuses/Orbits: No acute finding. Other: None. CT CERVICAL SPINE FINDINGS Alignment: Normal. Skull base and vertebrae: Vertebral body alignment and heights are normal. There is mild spondylosis of the cervical spine to include facet arthropathy and uncovertebral joint spurring. Small Schmorl's node over the superior endplate of C7. Atlantoaxial articulation is unremarkable. Minimally displaced fracture involving the base of the C2 spinous process extending towards the posterior aspect of the lamina bilaterally. Minimally displaced fracture of the base of the C3 spinous process. Minimally displaced fracture of the tip of the C7 spinous process. Soft tissues and spinal canal: No prevertebral fluid or swelling. No visible canal hematoma. Disc levels:  Normal. Upper chest: Negative. Other: None. IMPRESSION: 1.  No acute brain injury. 2.  Moderate chronic ischemic microvascular  disease. 3. Minimally displaced fractures at the base of the spinous processes of C2 and C3 as well as tip of the spinous process of C7. 4.  Mild spondylosis of the cervical spine. Electronically Signed: By: Marin Olp M.D. On: 05/03/2019 16:52   CT Thoracic Spine Wo Contrast  Result Date: 05/03/2019 CLINICAL DATA:  Fall. Severe neck and thoracic spine pain with a thoracic compression fracture on chest  radiographs. EXAM: CT THORACIC SPINE WITHOUT CONTRAST TECHNIQUE: Multidetector CT images of the thoracic were obtained using the standard protocol without intravenous contrast. COMPARISON:  Chest radiographs 05/03/2019. Chest CT 05/16/2017. FINDINGS: Alignment: Exaggerated upper thoracic kyphosis. No listhesis. Vertebrae: Mildly comminuted T7 vertebral body fracture with involvement of the anterior and middle columns and approximately 50% anterior vertebral body height loss without retropulsion. Chronic right-sided rib deformities. No suspicious osseous lesion. Paraspinal and other soft tissues: Mild swelling/small volume paravertebral hematoma at T7. Bilateral breast implants. Aortic and coronary artery atherosclerosis. Previous right upper lobectomy. Disc levels: No significant degenerative changes in the thoracic spine. IMPRESSION: Mildly comminuted 2-column T7 vertebral body fracture with 50% height loss. Electronically Signed   By: Logan Bores M.D.   On: 05/03/2019 17:01   DG C-Arm 1-60 Min  Result Date: 05/04/2019 CLINICAL DATA:  Kyphoplasty at T7 EXAM: THORACIC SPINE 2 VIEWS; DG C-ARM 1-60 MIN COMPARISON:  Thoracic radiographs May 03, 2019 FLUOROSCOPY TIME:  2 minutes 3 seconds; 2 acquired images FINDINGS: Frontal and lateral views were obtained intraoperatively over the T7 region. Patient is status post kyphoplasty at T7 with less wedging at T7 compared to precontrast administration. Slight extravasation of contrast is noted posteriorly. Adjacent vertebral bodies appear unremarkable. No spondylolisthesis. Slight disc space narrowing noted at T6-7. IMPRESSION: Contrast administration for kyphoplasty at T7 with slight extravasation posteriorly. Less collapse at T7 currently compared to precontrast images. Nearby vertebral bodies elsewhere appear unremarkable. Electronically Signed   By: Lowella Grip III M.D.   On: 05/04/2019 13:44       Subjective: Neck pain controlled on pain meds.  Reports  no bowel movement for past 2 days and informs she usually has bowel movement 1-2 times a week only.  Discharge Exam: Vitals:   05/07/19 0010 05/07/19 0817  BP: 131/70 (!) 141/70  Pulse: 87 76  Resp: 16 16  Temp: 97.7 F (36.5 C) 98.4 F (36.9 C)  SpO2: 92% 96%   Vitals:   05/05/19 2334 05/06/19 0805 05/07/19 0010 05/07/19 0817  BP: (!) 144/72 (!) 152/74 131/70 (!) 141/70  Pulse: 88 88 87 76  Resp: 17 16 16 16   Temp: 97.9 F (36.6 C) 98 F (36.7 C) 97.7 F (36.5 C) 98.4 F (36.9 C)  TempSrc:   Oral Oral  SpO2: 93% 100% 92% 96%  Weight:      Height:        General: Elderly female not in distress HEENT: Moist mucosa, supple neck, cervical collar in place Chest: Clear bilaterally CVs: Normal S1-S2, no murmurs GI: Soft, nondistended, nontender Musculoskeletal: Warm, no edema  CNs: Alert and oriented, nonfocal     The results of significant diagnostics from this hospitalization (including imaging, microbiology, ancillary and laboratory) are listed below for reference.     Microbiology: Recent Results (from the past 240 hour(s))  SARS CORONAVIRUS 2 (TAT 6-24 HRS) Nasopharyngeal Nasopharyngeal Swab     Status: None   Collection Time: 05/03/19  9:08 PM   Specimen: Nasopharyngeal Swab  Result Value Ref Range Status   SARS Coronavirus 2  NEGATIVE NEGATIVE Final    Comment: (NOTE) SARS-CoV-2 target nucleic acids are NOT DETECTED. The SARS-CoV-2 RNA is generally detectable in upper and lower respiratory specimens during the acute phase of infection. Negative results do not preclude SARS-CoV-2 infection, do not rule out co-infections with other pathogens, and should not be used as the sole basis for treatment or other patient management decisions. Negative results must be combined with clinical observations, patient history, and epidemiological information. The expected result is Negative. Fact Sheet for Patients: SugarRoll.be Fact Sheet for  Healthcare Providers: https://www.woods-mathews.com/ This test is not yet approved or cleared by the [place] FDA and  has been authorized for detection and/or diagnosis of SARS-CoV-2 by FDA under an Emergency Use Authorization (EUA). This EUA will remain  in effect (meaning this test can be used) for the duration of the COVID-19 declaration under Section 56 4(b)(1) of the Act, 21 U.S.C. section 360bbb-3(b)(1), unless the authorization is terminated or revoked sooner. Performed at [facility], [place] [address], [place], [place] [address]      Labs: BNP (last 3 results) No results for input(s): BNP in the last 8760 hours. Basic Metabolic Panel: Recent Labs  Lab 05/03/19 2108 05/04/19 0610  NA 137 138  K 3.8 3.9  CL 100 103  CO2 27 27  GLUCOSE 139* 111*  BUN 24* 19  CREATININE 0.73 0.68  CALCIUM 9.3 9.0   Liver Function Tests: Recent Labs  Lab 05/04/19 0610  AST 22  ALT 16  ALKPHOS 65  BILITOT 0.9  PROT 6.9  ALBUMIN 3.5   No results for input(s): LIPASE, AMYLASE in the last 168 hours. No results for input(s): AMMONIA in the last 168 hours. CBC: Recent Labs  Lab 05/03/19 2108 05/04/19 0610  WBC 18.0* 14.4*  NEUTROABS 15.6*  --   HGB 12.9 12.9  HCT 39.8 38.7  MCV 93.6 93.0  PLT 233 237   Cardiac Enzymes: No results for input(s): CKTOTAL, CKMB, CKMBINDEX, TROPONINI in the last 168 hours. BNP: Invalid input(s): POCBNP CBG: No results for input(s): GLUCAP in the last 168 hours. D-Dimer No results for input(s): DDIMER in the last 72 hours. Hgb A1c No results for input(s): HGBA1C in the last 72 hours. Lipid Profile No results for input(s): CHOL, HDL, LDLCALC, TRIG, CHOLHDL, LDLDIRECT in the last 72 hours. Thyroid function studies No results for input(s): TSH, T4TOTAL, T3FREE, THYROIDAB in the last 72 hours.  Invalid input(s): FREET3 Anemia work up No results for input(s): VITAMINB12, FOLATE, FERRITIN, TIBC, IRON, RETICCTPCT in the  last 72 hours. Urinalysis    Component Value Date/Time   COLORURINE YELLOW 06/17/2017 [place] 06/17/2017 1353   APPEARANCEUR Clear 10/15/2014 1143   LABSPEC 1.010 06/17/2017 1353   LABSPEC see comment 03/18/2012 1057   PHURINE 7.0 06/17/2017 1353   GLUCOSEU NEGATIVE 06/17/2017 1353   GLUCOSEU see comment 03/18/2012 1057   HGBUR NEGATIVE 06/17/2017 1353   BILIRUBINUR neg 01/09/2019 1106   BILIRUBINUR Negative 10/15/2014 1143   BILIRUBINUR see comment 03/18/2012 1057   KETONESUR NEGATIVE 06/17/2017 1353   PROTEINUR Negative 01/09/2019 1106   PROTEINUR NEGATIVE 06/17/2017 1353   UROBILINOGEN 0.2 01/09/2019 1106   NITRITE neg 01/09/2019 1106   NITRITE NEGATIVE 06/17/2017 1353   LEUKOCYTESUR Trace (A) 01/09/2019 1106   LEUKOCYTESUR Negative 10/15/2014 1143   LEUKOCYTESUR see comment 03/18/2012 1057   Sepsis Labs Invalid input(s): PROCALCITONIN,  WBC,  LACTICIDVEN Microbiology Recent Results (from the past 240 hour(s))  SARS CORONAVIRUS 2 (  TAT 6-24 HRS) Nasopharyngeal Nasopharyngeal Swab     Status: None   Collection Time: 05/03/19  9:08 PM   Specimen: Nasopharyngeal Swab  Result Value Ref Range Status   SARS Coronavirus 2 NEGATIVE NEGATIVE Final    Comment: (NOTE) SARS-CoV-2 target nucleic acids are NOT DETECTED. The SARS-CoV-2 RNA is generally detectable in upper and lower respiratory specimens during the acute phase of infection. Negative results do not preclude SARS-CoV-2 infection, do not rule out co-infections with other pathogens, and should not be used as the sole basis for treatment or other patient management decisions. Negative results must be combined with clinical observations, patient history, and epidemiological information. The expected result is Negative. Fact Sheet for Patients: SugarRoll.be Fact Sheet for Healthcare Providers: https://www.woods-mathews.com/ This test is not yet approved or cleared  by the [place] FDA and  has been authorized for detection and/or diagnosis of SARS-CoV-2 by FDA under an Emergency Use Authorization (EUA). This EUA will remain  in effect (meaning this test can be used) for the duration of the COVID-19 declaration under Section 56 4(b)(1) of the Act, 21 U.S.C. section 360bbb-3(b)(1), unless the authorization is terminated or revoked sooner. Performed at [facility], [place] [address], [place], [place] [address]      Time coordinating discharge: 35 minutes  SIGNED:   Louellen Molder, MD  [facility] 05/07/2019, 2:12 PM Pager   If 7PM-7AM, please contact night-coverage [email or web address] Password [email or web address]

## 2019-05-07 NOTE — Care Management Important Message (Signed)
Important Message  Patient Details  Name: Christy [name] MRN: [id number] Date of Birth: Jun 29, 1940   Medicare Important Message Given:  Yes     Dannette Barbara 05/07/2019, 11:29 AM

## 2019-05-18 ENCOUNTER — Other Ambulatory Visit: Payer: Self-pay | Admitting: Student

## 2019-05-18 DIAGNOSIS — S129XXD Fracture of neck, unspecified, subsequent encounter: Secondary | ICD-10-CM

## 2019-05-22 ENCOUNTER — Encounter: Payer: Self-pay | Admitting: Emergency Medicine

## 2019-05-22 ENCOUNTER — Inpatient Hospital Stay
Admission: EM | Admit: 2019-05-22 | Discharge: 2019-05-25 | DRG: 699 | Disposition: A | Discharge status: Home Health | Payer: Medicare Other | Source: Ambulatory Visit | Attending: Hospitalist | Admitting: Hospitalist

## 2019-05-22 ENCOUNTER — Other Ambulatory Visit: Payer: Self-pay

## 2019-05-22 DIAGNOSIS — Z882 Allergy status to sulfonamides status: Secondary | ICD-10-CM | POA: Diagnosis not present

## 2019-05-22 DIAGNOSIS — S12200D Unspecified displaced fracture of third cervical vertebra, subsequent encounter for fracture with routine healing: Secondary | ICD-10-CM | POA: Diagnosis not present

## 2019-05-22 DIAGNOSIS — F419 Anxiety disorder, unspecified: Secondary | ICD-10-CM | POA: Diagnosis present

## 2019-05-22 DIAGNOSIS — S12600D Unspecified displaced fracture of seventh cervical vertebra, subsequent encounter for fracture with routine healing: Secondary | ICD-10-CM | POA: Diagnosis not present

## 2019-05-22 DIAGNOSIS — A419 Sepsis, unspecified organism: Secondary | ICD-10-CM | POA: Diagnosis not present

## 2019-05-22 DIAGNOSIS — Z90710 Acquired absence of both cervix and uterus: Secondary | ICD-10-CM

## 2019-05-22 DIAGNOSIS — Z20822 Contact with and (suspected) exposure to covid-19: Secondary | ICD-10-CM | POA: Diagnosis present

## 2019-05-22 DIAGNOSIS — S12100D Unspecified displaced fracture of second cervical vertebra, subsequent encounter for fracture with routine healing: Secondary | ICD-10-CM

## 2019-05-22 DIAGNOSIS — Z66 Do not resuscitate: Secondary | ICD-10-CM | POA: Diagnosis present

## 2019-05-22 DIAGNOSIS — I9589 Other hypotension: Secondary | ICD-10-CM

## 2019-05-22 DIAGNOSIS — Z8051 Family history of malignant neoplasm of kidney: Secondary | ICD-10-CM | POA: Diagnosis not present

## 2019-05-22 DIAGNOSIS — Z79891 Long term (current) use of opiate analgesic: Secondary | ICD-10-CM

## 2019-05-22 DIAGNOSIS — Z888 Allergy status to other drugs, medicaments and biological substances status: Secondary | ICD-10-CM | POA: Diagnosis not present

## 2019-05-22 DIAGNOSIS — S12190D Other displaced fracture of second cervical vertebra, subsequent encounter for fracture with routine healing: Secondary | ICD-10-CM | POA: Diagnosis not present

## 2019-05-22 DIAGNOSIS — R3915 Urgency of urination: Secondary | ICD-10-CM | POA: Diagnosis present

## 2019-05-22 DIAGNOSIS — Z79899 Other long term (current) drug therapy: Secondary | ICD-10-CM

## 2019-05-22 DIAGNOSIS — B964 Proteus (mirabilis) (morganii) as the cause of diseases classified elsewhere: Secondary | ICD-10-CM | POA: Diagnosis present

## 2019-05-22 DIAGNOSIS — Z85118 Personal history of other malignant neoplasm of bronchus and lung: Secondary | ICD-10-CM | POA: Diagnosis not present

## 2019-05-22 DIAGNOSIS — I341 Nonrheumatic mitral (valve) prolapse: Secondary | ICD-10-CM | POA: Diagnosis present

## 2019-05-22 DIAGNOSIS — W19XXXD Unspecified fall, subsequent encounter: Secondary | ICD-10-CM | POA: Diagnosis present

## 2019-05-22 DIAGNOSIS — N179 Acute kidney failure, unspecified: Secondary | ICD-10-CM | POA: Diagnosis present

## 2019-05-22 DIAGNOSIS — S129XXA Fracture of neck, unspecified, initial encounter: Secondary | ICD-10-CM | POA: Diagnosis present

## 2019-05-22 DIAGNOSIS — E860 Dehydration: Secondary | ICD-10-CM | POA: Diagnosis present

## 2019-05-22 DIAGNOSIS — N3281 Overactive bladder: Secondary | ICD-10-CM | POA: Diagnosis present

## 2019-05-22 DIAGNOSIS — K219 Gastro-esophageal reflux disease without esophagitis: Secondary | ICD-10-CM | POA: Diagnosis present

## 2019-05-22 DIAGNOSIS — I129 Hypertensive chronic kidney disease with stage 1 through stage 4 chronic kidney disease, or unspecified chronic kidney disease: Secondary | ICD-10-CM | POA: Diagnosis present

## 2019-05-22 DIAGNOSIS — I959 Hypotension, unspecified: Secondary | ICD-10-CM | POA: Diagnosis present

## 2019-05-22 DIAGNOSIS — R531 Weakness: Secondary | ICD-10-CM

## 2019-05-22 DIAGNOSIS — N390 Urinary tract infection, site not specified: Secondary | ICD-10-CM

## 2019-05-22 DIAGNOSIS — N1831 Chronic kidney disease, stage 3a: Secondary | ICD-10-CM | POA: Diagnosis present

## 2019-05-22 DIAGNOSIS — F329 Major depressive disorder, single episode, unspecified: Secondary | ICD-10-CM | POA: Diagnosis present

## 2019-05-22 DIAGNOSIS — N189 Chronic kidney disease, unspecified: Secondary | ICD-10-CM | POA: Diagnosis not present

## 2019-05-22 DIAGNOSIS — M797 Fibromyalgia: Secondary | ICD-10-CM | POA: Diagnosis present

## 2019-05-22 LAB — CBC
HCT: 45 % (ref 36.0–46.0)
Hemoglobin: 15.1 g/dL — ABNORMAL HIGH (ref 12.0–15.0)
MCH: 31.5 pg (ref 26.0–34.0)
MCHC: 33.6 g/dL (ref 30.0–36.0)
MCV: 93.8 fL (ref 80.0–100.0)
Platelets: 317 10*3/uL (ref 150–400)
RBC: 4.8 MIL/uL (ref 3.87–5.11)
RDW: 12.7 % (ref 11.5–15.5)
WBC: 13.3 10*3/uL — ABNORMAL HIGH (ref 4.0–10.5)
nRBC: 0 % (ref 0.0–0.2)

## 2019-05-22 LAB — URINALYSIS, COMPLETE (UACMP) WITH MICROSCOPIC
Bilirubin Urine: NEGATIVE
Glucose, UA: NEGATIVE mg/dL
Hgb urine dipstick: NEGATIVE
Ketones, ur: NEGATIVE mg/dL
Nitrite: POSITIVE — AB
Protein, ur: NEGATIVE mg/dL
Specific Gravity, Urine: 1.01 (ref 1.005–1.030)
WBC, UA: >50 WBC/hpf — ABNORMAL HIGH (ref 0–5)
pH: 8 (ref 5.0–8.0)

## 2019-05-22 LAB — BASIC METABOLIC PANEL
Anion gap: 11 (ref 5–15)
Anion gap: 7 (ref 5–15)
BUN: 33 mg/dL — ABNORMAL HIGH (ref 8–23)
BUN: 29 mg/dL — ABNORMAL HIGH (ref 8–23)
CO2: 25 mmol/L (ref 22–32)
CO2: 25 mmol/L (ref 22–32)
Calcium: 9.6 mg/dL (ref 8.9–10.3)
Calcium: 8.7 mg/dL — ABNORMAL LOW (ref 8.9–10.3)
Chloride: 100 mmol/L (ref 98–111)
Chloride: 106 mmol/L (ref 98–111)
Creatinine, Ser: 1.23 mg/dL — ABNORMAL HIGH (ref 0.44–1.00)
Creatinine, Ser: 0.92 mg/dL (ref 0.44–1.00)
GFR calc Af Amer: 48 mL/min — ABNORMAL LOW (ref >60)
GFR calc Af Amer: >60 mL/min (ref >60)
GFR calc non Af Amer: 42 mL/min — ABNORMAL LOW (ref >60)
GFR calc non Af Amer: 59 mL/min — ABNORMAL LOW (ref >60)
Glucose, Bld: 148 mg/dL — ABNORMAL HIGH (ref 70–99)
Glucose, Bld: 117 mg/dL — ABNORMAL HIGH (ref 70–99)
Potassium: 4.1 mmol/L (ref 3.5–5.1)
Potassium: 4.6 mmol/L (ref 3.5–5.1)
Sodium: 136 mmol/L (ref 135–145)
Sodium: 138 mmol/L (ref 135–145)

## 2019-05-22 LAB — TROPONIN I (HIGH SENSITIVITY)
Troponin I (High Sensitivity): 7 ng/L (ref <18)
Troponin I (High Sensitivity): 7 ng/L (ref <18)

## 2019-05-22 LAB — LACTIC ACID, PLASMA
Lactic Acid, Venous: 1.6 mmol/L (ref 0.5–1.9)
Lactic Acid, Venous: 0.8 mmol/L (ref 0.5–1.9)

## 2019-05-22 LAB — PROTIME-INR
INR: 1 (ref 0.8–1.2)
Prothrombin Time: 12.9 seconds (ref 11.4–15.2)

## 2019-05-22 MED ORDER — ENOXAPARIN SODIUM 40 MG/0.4ML ~~LOC~~ SOLN
40.0000 mg | SUBCUTANEOUS | Status: DC
  Administered 2019-05-23 – 2019-05-25 (×3): 40 mg via SUBCUTANEOUS
  Filled 2019-05-22 (×3): qty 0.4

## 2019-05-22 MED ORDER — SODIUM CHLORIDE 0.9 % IV SOLN
1.0000 g | Freq: Once | INTRAVENOUS | Status: AC
  Administered 2019-05-22: 1 g via INTRAVENOUS
  Filled 2019-05-22: qty 10

## 2019-05-22 MED ORDER — DULOXETINE HCL 20 MG PO CPEP
20.0000 mg | ORAL_CAPSULE | Freq: Every day | ORAL | Status: DC
  Administered 2019-05-23 – 2019-05-25 (×3): 20 mg via ORAL
  Filled 2019-05-22 (×3): qty 1

## 2019-05-22 MED ORDER — MORPHINE SULFATE (PF) 2 MG/ML IV SOLN
2.0000 mg | Freq: Once | INTRAVENOUS | Status: AC
  Administered 2019-05-22: 2 mg via INTRAVENOUS
  Filled 2019-05-22: qty 1

## 2019-05-22 MED ORDER — POLYETHYLENE GLYCOL 3350 17 G PO PACK
17.0000 g | PACK | Freq: Two times a day (BID) | ORAL | Status: DC
  Administered 2019-05-22 – 2019-05-24 (×4): 17 g via ORAL
  Filled 2019-05-22 (×5): qty 1

## 2019-05-22 MED ORDER — LACTATED RINGERS IV BOLUS
1000.0000 mL | Freq: Once | INTRAVENOUS | Status: AC
  Administered 2019-05-22: 1000 mL via INTRAVENOUS

## 2019-05-22 MED ORDER — ONDANSETRON HCL 4 MG/2ML IJ SOLN
4.0000 mg | Freq: Once | INTRAMUSCULAR | Status: AC
  Administered 2019-05-22: 4 mg via INTRAVENOUS
  Filled 2019-05-22: qty 2

## 2019-05-22 MED ORDER — SODIUM CHLORIDE 0.9 % IV SOLN
1.0000 g | INTRAVENOUS | Status: DC
  Administered 2019-05-23 – 2019-05-24 (×2): 1 g via INTRAVENOUS
  Filled 2019-05-22 (×2): qty 1
  Filled 2019-05-22: qty 10

## 2019-05-22 MED ORDER — VITAMIN B-12 1000 MCG PO TABS
1000.0000 ug | ORAL_TABLET | Freq: Every day | ORAL | Status: DC
  Administered 2019-05-23 – 2019-05-25 (×3): 1000 ug via ORAL
  Filled 2019-05-22 (×3): qty 1

## 2019-05-22 MED ORDER — SODIUM CHLORIDE 0.9 % IV BOLUS
1000.0000 mL | Freq: Once | INTRAVENOUS | Status: AC
  Administered 2019-05-22: 1000 mL via INTRAVENOUS

## 2019-05-22 MED ORDER — ESTRADIOL 1 MG PO TABS
1.0000 mg | ORAL_TABLET | Freq: Every day | ORAL | Status: DC
  Administered 2019-05-23 – 2019-05-25 (×3): 1 mg via ORAL
  Filled 2019-05-22 (×4): qty 1

## 2019-05-22 MED ORDER — HYDROCODONE-ACETAMINOPHEN 5-325 MG PO TABS
2.0000 | ORAL_TABLET | Freq: Four times a day (QID) | ORAL | Status: DC | PRN
  Administered 2019-05-22 – 2019-05-23 (×2): 2 via ORAL
  Filled 2019-05-22 (×2): qty 2

## 2019-05-22 MED ORDER — PANTOPRAZOLE SODIUM 40 MG PO TBEC
40.0000 mg | DELAYED_RELEASE_TABLET | Freq: Every day | ORAL | Status: DC
  Administered 2019-05-23 – 2019-05-25 (×3): 40 mg via ORAL
  Filled 2019-05-22 (×3): qty 1

## 2019-05-22 MED ORDER — NORTRIPTYLINE HCL 10 MG PO CAPS
20.0000 mg | ORAL_CAPSULE | Freq: Every day | ORAL | Status: DC
  Administered 2019-05-23 – 2019-05-24 (×2): 20 mg via ORAL
  Filled 2019-05-22 (×4): qty 2

## 2019-05-22 NOTE — ED Triage Notes (Signed)
Pt arrived via POV with reports of 4 days of feeling sick on her stomach and c/o nausea. Pt reports decreased PO intake and appetite.  PT has [place] collar on due to recent c-spine compression fracture.   Pt keeps saying in triage she needs to lay down.  Informed her we do not have anywhere for her to lay down.

## 2019-05-22 NOTE — ED Notes (Signed)
Pt provided with apple juice per pt's request.

## 2019-05-22 NOTE — ED Provider Notes (Signed)
[facility] Emergency Department Provider Note  Time seen: 5:32 PM  I have reviewed the triage vital signs and the nursing notes.   HISTORY  Chief Complaint Nausea and Weakness   HPI Christy [name] is a 79 y.o. female with a past medical history anxiety, allergies, gastric reflux, hypertension, urinary incontinence, fibromyalgia, recent cervical spine fracture currently in a [place] J collar presents to the emergency department for generalized fatigue weakness nausea.  According to the patient since her fall she has been feeling nauseated with decreased appetite that is worse over the past 4 days.  States she has been feeling weak today.  Patient sent from [facility] clinic for evaluation.  Per [facility] clinic note patient sent for evaluation of possible dehydration.  Past Medical History:  Diagnosis Date  . Allergy   . Anxiety   . Depression   . Fatty liver disease, nonalcoholic   . GERD (gastroesophageal reflux disease)   . Heart disease   . Hypertension   . Lung cancer ([place]) 02/2014   RUL Lobectomy  . Urinary incontinence     Patient Active Problem List   Diagnosis Date Noted  . Compression fracture of body of thoracic vertebra ([place]) 05/04/2019  . Compression fracture of C-spine ([place]) 05/04/2019  . Fall 05/03/2019  . Cystocele, midline 04/28/2017  . Stress incontinence of urine 10/15/2014  . Polypharmacy 09/02/2014  . Primary cancer of right upper lobe of lung ([place]) 07/19/2014  . Acid reflux 07/18/2014  . 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 Chronic kidney disease (CKD), stage III (moderate) 08/30/2013  . Benign essential HTN 08/30/2013  . Fatty liver disease, nonalcoholic 89/38/1017  . Fibrositis 08/30/2013  . Hot flash, menopausal 08/30/2013  . Billowing mitral valve 08/30/2013  . Allergic rhinitis, seasonal 08/30/2013  . Fibromyalgia 08/30/2013    Past Surgical History:  Procedure Laterality Date   . ABDOMINAL HYSTERECTOMY    . KYPHOPLASTY N/A 05/04/2019   Procedure: KYPHOPLASTY T7;  Surgeon: Hessie Knows, MD;  Location: [facility] ORS;  Service: Orthopedics;  Laterality: N/A;  . [place]  . LARYNGOSCOPY N/A 07/22/2014   Procedure: JET LARYNGOSCOPY with right vocal cord biopsy;  Surgeon: Margaretha Sheffield, MD;  Location: [facility] ORS;  Service: ENT;  Laterality: N/A;  . LUNG CANCER SURGERY      Prior to Admission medications   Medication Sig Start Date End Date Taking? Authorizing Provider  benazepril (LOTENSIN) 5 MG tablet Take 1 tablet by mouth daily. 03/04/14   [provider]  calcium citrate-vitamin D 500-400 MG-UNIT chewable tablet Chew 1 tablet by mouth daily.    [provider]  DULoxetine (CYMBALTA) 20 MG capsule Take 1 capsule by mouth daily. 10/09/18   [provider]  estradiol (ESTRACE) 1 MG tablet Take 1 mg by mouth daily.  04/29/17   [provider]  HYDROcodone-acetaminophen (NORCO/VICODIN) 5-325 MG tablet Take 2 tablets by mouth every 6 (six) hours as needed for moderate pain. 05/07/19   Dhungel, Flonnie Overman, MD  Multiple Vitamins-Minerals (OCUVITE ADULT 50+ PO) Take 1 tablet by mouth daily.    [provider]  nortriptyline (PAMELOR) 10 MG capsule Take 20 mg by mouth at bedtime.  03/21/19   [provider]  polyethylene glycol (MIRALAX / GLYCOLAX) 17 g packet Take 17 g by mouth 2 (two) times daily. 05/07/19   Dhungel, Nishant, MD  senna-docusate (SENOKOT-S) 8.6-50 MG tablet Take 2 tablets by mouth daily as needed for mild constipation. 05/07/19 05/06/20  Dhungel, Nishant, MD  vitamin B-12 (CYANOCOBALAMIN) 1000 MCG tablet Take 1,000 mcg by mouth daily.    [provider]    Allergies  Allergen Reactions  . Alendronate Other (See Comments)    Esophageal burning  . Sulfa Antibiotics Other (See Comments)  . Ciprofloxacin Other (See Comments)    Other Reaction: GI UPSET  . Diphenhydramine Other (See Comments)     Family History  Problem Relation Age of Onset  . Kidney cancer Cousin   . Cancer Maternal Grandfather   . Cancer Maternal Grandmother   . Kidney disease Neg Hx   . Prostate cancer Neg Hx     Social History Social History   Tobacco Use  . Smoking status: Former Smoker    Packs/day: 0.50    Years: 25.00    Pack years: 12.50    Types: Cigarettes    Quit date: 07/18/2006    Years since quitting: 12.8  . Smokeless tobacco: Never Used  Substance Use Topics  . Alcohol use: Yes    Alcohol/week: 2.0 standard drinks    Types: 1 Glasses of wine, 1 Cans of beer per week    Comment: occassionally about 4 times a year  . Drug use: No    Review of Systems Constitutional: Negative for fever.  Generalized fatigue. Cardiovascular: Negative for chest pain. Respiratory: Negative for shortness of breath. Gastrointestinal: Negative for abdominal pain.  Positive for nausea but negative for vomiting or diarrhea. Genitourinary: Negative for urinary compaints.  Chronic incontinence. Musculoskeletal: Negative for musculoskeletal complaints Neurological: Negative for headache All other ROS negative  ____________________________________________   PHYSICAL EXAM:  VITAL SIGNS: ED Triage Vitals  Enc Vitals Group     BP 05/22/19 1317 (!) 83/55     Pulse Rate 05/22/19 1328 (!) 129     Resp 05/22/19 1317 20     Temp 05/22/19 1317 98.1 F (36.7 C)     Temp Source 05/22/19 1317 Oral     SpO2 05/22/19 1317 100 %     Weight 05/22/19 1319 149 lb (67.6 kg)     Height 05/22/19 1319 5\' 5"  (1.651 m)     Head Circumference --      Peak Flow --      Pain Score 05/22/19 1325 4     Pain Loc --      Pain Edu? --      Excl. in [place]? --    Constitutional: Alert and oriented. Well appearing and in no distress. Eyes: Normal exam ENT      Head: Normocephalic and atraumatic.      Mouth/Throat: Mucous membranes are moist. Cardiovascular: Normal rate, regular rhythm.  Respiratory: Normal respiratory  effort without tachypnea nor retractions. Breath sounds are clear  Gastrointestinal: Soft and nontender. No distention. Musculoskeletal: Nontender with normal range of motion in all extremities. Neurologic:  Normal speech and language. No gross focal neurologic deficits Skin:  Skin is warm, dry and intact.  Psychiatric: Mood and affect are normal.   ____________________________________________    EKG  EKG viewed and interpreted by myself shows a normal sinus rhythm at 96 bpm with a narrow QRS, normal axis, normal intervals, no concerning ST changes.  ____________________________________________    INITIAL IMPRESSION / ASSESSMENT AND PLAN / ED COURSE  Pertinent labs & imaging results that were available during my care of the patient were reviewed by me and considered in my medical decision making (see chart for details).   Patient presents emergency department generalized fatigue weakness  and nausea.  States the nausea has been an ongoing issue since her fall 3 weeks ago currently in a [place] J collar.  Patient states the nausea worse over the past 4 days where she has been feeling weak.  Upon arrival patient is noted to be somewhat hypotensive however with minimal IV fluids as has corrected and the patient is now hypertensive.  Patient states she is feeling much better after fluids.  Patient's lab work is consistent with dehydration creatinine of 1.2 with a baseline 1.6-0.8.  We will dose an additional liter of lactated Ringer's.  Reassuringly patient's troponin is negative.  We will repeat a BMP following fluid administration.  We will also obtain a urinalysis via in and out cath.  Patient agreeable to plan of care.  Patient's BMP shows improving creatinine after 2 L of fluid.  Patient's urinalysis however shows a nitrite positive result with greater than 50 white blood cells likely indicating urinary tract infection.  Given the patient's significant weakness renal insufficiency nausea unable  to tolerate p.o. patient will be admitted to the hospital service for further treatment and work-up.  I have dosed IV Rocephin and send a urine culture.  Tirzah Fross was evaluated in Emergency Department on 05/22/2019 for the symptoms described in the history of present illness. She was evaluated in the context of the global COVID-19 pandemic, which necessitated consideration that the patient might be at risk for infection with the SARS-CoV-2 virus that causes COVID-19. Institutional protocols and algorithms that pertain to the evaluation of patients at risk for COVID-19 are in a state of rapid change based on information released by regulatory bodies including the CDC and federal and state organizations. These policies and algorithms were followed during the patient's care in the ED.  ____________________________________________   FINAL CLINICAL IMPRESSION(S) / ED DIAGNOSES  Nausea Weakness Dehydration   Harvest Dark, MD 05/22/19 2046

## 2019-05-22 NOTE — ED Notes (Signed)
Pt denies nausea. Pt provided with a sandwich tray and water.

## 2019-05-22 NOTE — ED Notes (Signed)
Pt denies nausea and asking for juice and food. This RN provided with crackers and apple juice by pt's request.

## 2019-05-22 NOTE — ED Notes (Signed)
Pt able to void. UA collected and sent it to lab.

## 2019-05-22 NOTE — H&P (Signed)
History and Physical    Khamil Lamica IHK:[id number] DOB: 1940/06/03 DOA: 05/22/2019  PCP: Ezequiel Kayser, MD  Patient coming from: Outpatient clinic  I have personally briefly reviewed patient's old medical records in [place]  Chief Complaint: nausea  HPI: Christy [name] is a 79 y.o. female with medical history significant for remote right lung cancer s/p resection, CKD stage IIIa, hypertension, mitral valve prolapse, anxiety depression who presents from outpatient clinic for concerns of nausea.  Patient was recently discharged from the hospital on to SNF following a mechanical fall resulting in C2, C3 and 7 process fracture and acute T7 compression fracture s/p kyphoplasty on 3/26.  Since being in the rehab facility, patient says that she is constantly felt nauseous but has never vomited.  She also feels generally uncomfortable throughout her entire abdomen.  States she has lost about 11 pounds since she been in rehab.  She is incontinent at baseline but felt that she had increased urgency.  She told people at rehab that she might have had a UTI but never received any treatment. She denies any fever.  No chest pain or shortness of breath.  No diarrhea. Denies any worsening of her back following kyphoplasty.   ED Course: She was afebrile but tachycardic and hypotensive down to 83/55.  This improved following 2 L of IV fluids.  Notable for leukocytosis of 13.3 and hemoglobin of 15.1.  Glucose of 148, creatinine elevated to 1.23 from a prior of 0.68.  Although this resolved after repeat following fluids.  UA showed large leukocyte and positive nitrite with greater than 50 WBCs. She was started on IV Rocephin in the ED.  Review of Systems:  Constitutional: No Weight Change, No Fever ENT/Mouth: No sore throat, No Rhinorrhea Eyes: No Eye Pain, No Vision Changes Cardiovascular: No Chest Pain, no SOB Respiratory: No Cough, No Sputum Gastrointestinal: + Nausea, No  Vomiting, No Diarrhea, No Constipation, No Pain Genitourinary: + Urinary Incontinence, + Urgency, No Flank Pain Musculoskeletal: No Arthralgias, No Myalgias Skin: No Skin Lesions, No Pruritus, Neuro: no Weakness, No Numbness, no sycope Psych: No Anxiety/Panic, No Depression, no decrease appetite Heme/Lymph: No Bruising, No Bleeding  Past Medical History:  Diagnosis Date  . Allergy   . Anxiety   . Depression   . Fatty liver disease, nonalcoholic   . GERD (gastroesophageal reflux disease)   . Heart disease   . Hypertension   . Lung cancer ([place]) 02/2014   RUL Lobectomy  . Urinary incontinence     Past Surgical History:  Procedure Laterality Date  . ABDOMINAL HYSTERECTOMY    . KYPHOPLASTY N/A 05/04/2019   Procedure: KYPHOPLASTY T7;  Surgeon: Hessie Knows, MD;  Location: [facility] ORS;  Service: Orthopedics;  Laterality: N/A;  . [place]  . LARYNGOSCOPY N/A 07/22/2014   Procedure: JET LARYNGOSCOPY with right vocal cord biopsy;  Surgeon: Margaretha Sheffield, MD;  Location: [facility] ORS;  Service: ENT;  Laterality: N/A;  . LUNG CANCER SURGERY       reports that she quit smoking about 12 years ago. Her smoking use included cigarettes. She has a 12.50 pack-year smoking history. She has never used smokeless tobacco. She reports current alcohol use of about 2.0 standard drinks of alcohol per week. She reports that she does not use drugs.  Allergies  Allergen Reactions  . Alendronate Other (See Comments)    Esophageal burning  . Sulfa Antibiotics Other (See Comments)  . Ciprofloxacin Other (See Comments)    Other  Reaction: GI UPSET  . Diphenhydramine Other (See Comments)    Family History  Problem Relation Age of Onset  . Kidney cancer Cousin   . Cancer Maternal Grandfather   . Cancer Maternal Grandmother   . Kidney disease Neg Hx   . Prostate cancer Neg Hx      Prior to Admission medications   Medication Sig Start Date End Date Taking? Authorizing Provider   benazepril (LOTENSIN) 5 MG tablet Take 1 tablet by mouth daily. 03/04/14   [provider]  calcium citrate-vitamin D 500-400 MG-UNIT chewable tablet Chew 1 tablet by mouth daily.    [provider]  DULoxetine (CYMBALTA) 20 MG capsule Take 1 capsule by mouth daily. 10/09/18   [provider]  estradiol (ESTRACE) 1 MG tablet Take 1 mg by mouth daily.  04/29/17   [provider]  HYDROcodone-acetaminophen (NORCO/VICODIN) 5-325 MG tablet Take 2 tablets by mouth every 6 (six) hours as needed for moderate pain. 05/07/19   Dhungel, Flonnie Overman, MD  Multiple Vitamins-Minerals (OCUVITE ADULT 50+ PO) Take 1 tablet by mouth daily.    [provider]  nortriptyline (PAMELOR) 10 MG capsule Take 20 mg by mouth at bedtime.  03/21/19   [provider]  polyethylene glycol (MIRALAX / GLYCOLAX) 17 g packet Take 17 g by mouth 2 (two) times daily. 05/07/19   Dhungel, Nishant, MD  senna-docusate (SENOKOT-S) 8.6-50 MG tablet Take 2 tablets by mouth daily as needed for mild constipation. 05/07/19 05/06/20  Dhungel, Flonnie Overman, MD  vitamin B-12 (CYANOCOBALAMIN) 1000 MCG tablet Take 1,000 mcg by mouth daily.    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 Neck: normal, supple, wearing [place] J collar Respiratory: clear to  auscultation bilaterally, no wheezing, no crackles. Normal respiratory effort on room air. No accessory muscle use.  Cardiovascular: Regular rate and rhythm, no murmurs / rubs / gallops. No extremity edema. 2+ pedal pulses. Abdomen: no tenderness, no masses palpated.  Bowel sounds positive.  Musculoskeletal: no clubbing / cyanosis. No joint deformity upper and lower extremities. Good ROM, no contractures. Normal muscle tone.  Skin: no rashes, lesions, ulcers. No induration Neurologic: CN 2-12 grossly intact. Sensation intact. Strength 5/5 in all 4.  Psychiatric: Normal judgment and insight. Alert and oriented x 3. Normal pleasant mood.     Labs on Admission: I have personally reviewed following labs and imaging studies  CBC: Recent Labs  Lab 05/22/19 1332  WBC 13.3*  HGB 15.1*  HCT 45.0  MCV 93.8  PLT 725   Basic Metabolic Panel: Recent Labs  Lab 05/22/19 1332 05/22/19  1910  NA 136 138  K 4.1 4.6  CL 100 106  CO2 25 25  GLUCOSE 148* 117*  BUN 33* 29*  CREATININE 1.23* 0.92  CALCIUM 9.6 8.7*   GFR: Estimated Creatinine Clearance: 44.6 mL/min (by C-G formula based on SCr of 0.92 mg/dL). Liver Function Tests: No results for input(s): AST, ALT, ALKPHOS, BILITOT, PROT, ALBUMIN in the last 168 hours. No results for input(s): LIPASE, AMYLASE in the last 168 hours. No results for input(s): AMMONIA in the last 168 hours. Coagulation Profile: Recent Labs  Lab 05/22/19 1332  INR 1.0   Cardiac Enzymes: No results for input(s): CKTOTAL, CKMB, CKMBINDEX, TROPONINI in the last 168 hours. BNP (last 3 results) No results for input(s): PROBNP in the last 8760 hours. HbA1C: No results for input(s): HGBA1C in the last 72 hours. CBG: No results for input(s): GLUCAP in the last 168 hours. Lipid Profile: No results for input(s): CHOL, HDL, LDLCALC, TRIG, CHOLHDL, LDLDIRECT in the last 72 hours. Thyroid Function Tests: No results for input(s): TSH, T4TOTAL, FREET4, T3FREE,  THYROIDAB in the last 72 hours. Anemia Panel: No results for input(s): VITAMINB12, FOLATE, FERRITIN, TIBC, IRON, RETICCTPCT in the last 72 hours. Urine analysis:    Component Value Date/Time   COLORURINE YELLOW (A) 05/22/2019 1844   APPEARANCEUR HAZY (A) 05/22/2019 1844   APPEARANCEUR Clear 10/15/2014 1143   LABSPEC 1.010 05/22/2019 1844   LABSPEC see comment 03/18/2012 1057   PHURINE 8.0 05/22/2019 1844   GLUCOSEU NEGATIVE 05/22/2019 1844   GLUCOSEU see comment 03/18/2012 1057   HGBUR NEGATIVE 05/22/2019 1844   BILIRUBINUR NEGATIVE 05/22/2019 1844   BILIRUBINUR neg 01/09/2019 1106   BILIRUBINUR Negative 10/15/2014 1143   BILIRUBINUR see comment 03/18/2012 [place] 05/22/2019 1844   PROTEINUR NEGATIVE 05/22/2019 1844   UROBILINOGEN 0.2 01/09/2019 1106   NITRITE POSITIVE (A) 05/22/2019 1844   LEUKOCYTESUR LARGE (A) 05/22/2019 1844   LEUKOCYTESUR see comment 03/18/2012 1057    Radiological Exams on Admission: No results found.  EKG: Independently reviewed.   Assessment/Plan Sepsis secondary to UTI Continue IV Rocephin Urine culture pending  Hypotension secondary to sepsis Now normotensive following 2 L of IV fluids Continue to monitor Hold all antihypertensives  AKI on CKD stage IIIa Presented with creatinine of 1.23.  Now resolved following fluids  History of recent C2, C3 and 7 spinous process fracture following mechanical fall Continue to wear [place] J collar Continue PRN pain medication  GERD Continue PPI  DVT prophylaxis:.Lovenox Code Status: Full Family Communication: Plan discussed with patient at bedside  disposition Plan: Home with at least 2 midnight stays  Consults called:  Admission status: inpatient  Constantine Ruddick T Blossom Crume DO [facility]   If 7PM-7AM, please contact night-coverage [email or web address]   05/22/2019, 9:24 PM

## 2019-05-22 NOTE — ED Notes (Signed)
Pt c/o neck pain. Sharion Settler NP messaged for prn medication

## 2019-05-22 NOTE — ED Notes (Signed)
EDP Paduchowski at bedside.

## 2019-05-23 DIAGNOSIS — N390 Urinary tract infection, site not specified: Secondary | ICD-10-CM

## 2019-05-23 DIAGNOSIS — A419 Sepsis, unspecified organism: Secondary | ICD-10-CM

## 2019-05-23 LAB — CBC
HCT: 34.6 % — ABNORMAL LOW (ref 36.0–46.0)
Hemoglobin: 11.5 g/dL — ABNORMAL LOW (ref 12.0–15.0)
MCH: 31 pg (ref 26.0–34.0)
MCHC: 33.2 g/dL (ref 30.0–36.0)
MCV: 93.3 fL (ref 80.0–100.0)
Platelets: 218 10*3/uL (ref 150–400)
RBC: 3.71 MIL/uL — ABNORMAL LOW (ref 3.87–5.11)
RDW: 12.8 % (ref 11.5–15.5)
WBC: 9.6 10*3/uL (ref 4.0–10.5)
nRBC: 0 % (ref 0.0–0.2)

## 2019-05-23 LAB — BASIC METABOLIC PANEL
Anion gap: 6 (ref 5–15)
BUN: 25 mg/dL — ABNORMAL HIGH (ref 8–23)
CO2: 25 mmol/L (ref 22–32)
Calcium: 8 mg/dL — ABNORMAL LOW (ref 8.9–10.3)
Chloride: 107 mmol/L (ref 98–111)
Creatinine, Ser: 0.89 mg/dL (ref 0.44–1.00)
GFR calc Af Amer: >60 mL/min (ref >60)
GFR calc non Af Amer: >60 mL/min (ref >60)
Glucose, Bld: 104 mg/dL — ABNORMAL HIGH (ref 70–99)
Potassium: 4.2 mmol/L (ref 3.5–5.1)
Sodium: 138 mmol/L (ref 135–145)

## 2019-05-23 LAB — SARS CORONAVIRUS 2 (TAT 6-24 HRS): SARS Coronavirus 2: NEGATIVE

## 2019-05-23 MED ORDER — SODIUM CHLORIDE 0.9 % IV SOLN: INTRAVENOUS | Status: DC

## 2019-05-23 MED ORDER — DOCUSATE SODIUM 100 MG PO CAPS: 100.0000 mg | ORAL_CAPSULE | Freq: Two times a day (BID) | ORAL | Status: DC | PRN

## 2019-05-23 MED ORDER — ONDANSETRON 4 MG PO TBDP
4.0000 mg | ORAL_TABLET | Freq: Three times a day (TID) | ORAL | Status: DC | PRN
  Filled 2019-05-23: qty 1

## 2019-05-23 MED ORDER — ALUM & MAG HYDROXIDE-SIMETH 200-200-20 MG/5ML PO SUSP: 15.0000 mL | Freq: Four times a day (QID) | ORAL | Status: DC | PRN

## 2019-05-23 MED ORDER — ONDANSETRON HCL 4 MG/2ML IJ SOLN
4.0000 mg | Freq: Four times a day (QID) | INTRAMUSCULAR | Status: DC | PRN
  Administered 2019-05-23: 4 mg via INTRAVENOUS
  Filled 2019-05-23: qty 2

## 2019-05-23 MED ORDER — GUAIFENESIN-DM 100-10 MG/5ML PO SYRP: 10.0000 mL | ORAL_SOLUTION | Freq: Four times a day (QID) | ORAL | Status: DC | PRN

## 2019-05-23 MED ORDER — CALCIUM CARBONATE ANTACID 500 MG PO CHEW: 1.0000 | CHEWABLE_TABLET | Freq: Three times a day (TID) | ORAL | Status: DC | PRN

## 2019-05-23 NOTE — Progress Notes (Signed)
PROGRESS NOTE    Christy [name]  WJX:[id number] DOB: 1940-12-19 DOA: 05/22/2019 PCP: Ezequiel Kayser, MD    Assessment & Plan:   Principal Problem:   Sepsis secondary to UTI [facility]) Active Problems:   Acid reflux   Compression fracture of C-spine ([place])   Hypotension   Acute kidney injury superimposed on CKD ([place])    Christy [name] is a 79 y.o. female with medical history significant for remote right lung cancer s/p resection, CKD stage IIIa, hypertension, mitral valve prolapse, anxiety depression who presents from outpatient clinic for concerns of nausea.  Patient was recently discharged from the hospital on to SNF following a mechanical fall resulting in C2, C3 and 7 process fracture and acute T7 compression fracture s/p kyphoplasty on 3/26.  Since being in the rehab facility, patient says that she is constantly felt nauseous but has never vomited.  She also feels generally uncomfortable throughout her entire abdomen.  States she has lost about 11 pounds since she been in rehab.  She is incontinent at baseline but felt that she had increased urgency.  She told people at rehab that she might have had a UTI but never received any treatment.   UTI Sepsis ruled out Symptom was of urinary frequency.  Mild leukocytosis.  No fever. Continue IV Rocephin Urine culture pending  Hypotension likely 2/2 dehydration  Now normotensive following 2 L of IV fluids Continue to monitor Hold all antihypertensives  AKI on CKD stage IIIa Presented with creatinine of 1.23.  Now resolved following fluids  History of recent C2, C3 and 7 spinous process fracture following mechanical fall Continue to wear [place] collar Continue PRN pain medication  GERD Continue PPI   DVT prophylaxis: Lovenox SQ Code Status: DNR  Family Communication: daughter and husband updated at bedside today Disposition Plan: Home in 1-2 day, pending urine cx and symptom improvement.   Subjective and  Interval History:  Pt reported feeling better.  No dysuria, abdominal pain, just urgency with her UTI.  Complained of mild scratchy throat, but no dyspnea, cough, fever.  Nausea improved.  No vomiting, diarrhea.   Objective: Vitals:   05/23/19 0037 05/23/19 0841 05/23/19 1211 05/23/19 1608  BP: 139/73 137/78 118/60 137/72  Pulse: 95 91 89 88  Resp: 19     Temp: 97.8 F (36.6 C) 97.7 F (36.5 C) 97.9 F (36.6 C) 98 F (36.7 C)  TempSrc: Oral Oral Oral Oral  SpO2: 95% 100% 99% 98%  Weight:      Height:        Intake/Output Summary (Last 24 hours) at 05/23/2019 2210 Last data filed at 05/23/2019 1904 Gross per 24 hour  Intake 653.3 ml  Output 950 ml  Net -296.7 ml   Filed Weights   05/22/19 1319 05/22/19 1326  Weight: 67.6 kg 67.6 kg    Examination:   Constitutional: NAD, AAOx3 HEENT: conjunctivae and lids normal, EOMI, neck in brace CV: RRR no M,R,G. Distal pulses +2.  No cyanosis.   RESP: CTA B/L, normal respiratory effort  GI: +BS, NTND Extremities: No effusions, edema, or tenderness in BLE SKIN: warm, dry and intact Neuro: II - XII grossly intact.  Sensation intact Psych: Normal mood and affect.  Appropriate judgement and reason   Data Reviewed: I have personally reviewed following labs and imaging studies  CBC: Recent Labs  Lab 05/22/19 1332 05/23/19 0551  WBC 13.3* 9.6  HGB 15.1* 11.5*  HCT 45.0 34.6*  MCV 93.8 93.3  PLT 317  481   Basic Metabolic Panel: Recent Labs  Lab 05/22/19 1332 05/22/19 1910 05/23/19 0551  NA 136 138 138  K 4.1 4.6 4.2  CL 100 106 107  CO2 25 25 25   GLUCOSE 148* 117* 104*  BUN 33* 29* 25*  CREATININE 1.23* 0.92 0.89  CALCIUM 9.6 8.7* 8.0*   GFR: Estimated Creatinine Clearance: 46.1 mL/min (by C-G formula based on SCr of 0.89 mg/dL). Liver Function Tests: No results for input(s): AST, ALT, ALKPHOS, BILITOT, PROT, ALBUMIN in the last 168 hours. No results for input(s): LIPASE, AMYLASE in the last 168 hours. No  results for input(s): AMMONIA in the last 168 hours. Coagulation Profile: Recent Labs  Lab 05/22/19 1332  INR 1.0   Cardiac Enzymes: No results for input(s): CKTOTAL, CKMB, CKMBINDEX, TROPONINI in the last 168 hours. BNP (last 3 results) No results for input(s): PROBNP in the last 8760 hours. HbA1C: No results for input(s): HGBA1C in the last 72 hours. CBG: No results for input(s): GLUCAP in the last 168 hours. Lipid Profile: No results for input(s): CHOL, HDL, LDLCALC, TRIG, CHOLHDL, LDLDIRECT in the last 72 hours. Thyroid Function Tests: No results for input(s): TSH, T4TOTAL, FREET4, T3FREE, THYROIDAB in the last 72 hours. Anemia Panel: No results for input(s): VITAMINB12, FOLATE, FERRITIN, TIBC, IRON, RETICCTPCT in the last 72 hours. Sepsis Labs: Recent Labs  Lab 05/22/19 1334 05/22/19 1716  LATICACIDVEN 1.6 0.8    Recent Results (from the past 240 hour(s))  Culture, blood (Routine x 2)     Status: None (Preliminary result)   Collection Time: 05/22/19  1:38 PM   Specimen: BLOOD RIGHT ARM  Result Value Ref Range Status   Specimen Description BLOOD RIGHT ARM  Final   Special Requests   Final    BOTTLES DRAWN AEROBIC AND ANAEROBIC Blood Culture adequate volume   Culture   Final    NO GROWTH < 24 HOURS Performed at [facility], [address], [place], [place] [address]    Report Status PENDING  Incomplete  SARS CORONAVIRUS 2 (TAT 6-24 HRS) Nasopharyngeal Nasopharyngeal Swab     Status: None   Collection Time: 05/22/19  8:23 PM   Specimen: Nasopharyngeal Swab  Result Value Ref Range Status   SARS Coronavirus 2 NEGATIVE NEGATIVE Final    Comment: (NOTE) SARS-CoV-2 target nucleic acids are NOT DETECTED. The SARS-CoV-2 RNA is generally detectable in upper and lower respiratory specimens during the acute phase of infection. Negative results do not preclude SARS-CoV-2 infection, do not rule out co-infections with other pathogens, and should not be used as  the sole basis for treatment or other patient management decisions. Negative results must be combined with clinical observations, patient history, and epidemiological information. The expected result is Negative. Fact Sheet for Patients: SugarRoll.be Fact Sheet for Healthcare Providers: https://www.woods-mathews.com/ This test is not yet approved or cleared by the [place] FDA and  has been authorized for detection and/or diagnosis of SARS-CoV-2 by FDA under an Emergency Use Authorization (EUA). This EUA will remain  in effect (meaning this test can be used) for the duration of the COVID-19 declaration under Section 56 4(b)(1) of the Act, 21 U.S.C. section 360bbb-3(b)(1), unless the authorization is terminated or revoked sooner. Performed at [facility], [place] [address] [place],  [address]       Radiology Studies: No results found.   Scheduled Meds: . DULoxetine  20 mg Oral Daily  . enoxaparin (LOVENOX) injection  40 mg Subcutaneous Q24H  .  estradiol  1 mg Oral Daily  . nortriptyline  20 mg Oral QHS  . pantoprazole  40 mg Oral Daily  . polyethylene glycol  17 g Oral BID  . vitamin B-12  1,000 mcg Oral Daily   Continuous Infusions: . sodium chloride 75 mL/hr at 05/23/19 1541  . cefTRIAXone (ROCEPHIN)  IV 1 g (05/23/19 2114)     LOS: 1 day     Enzo Bi, MD [facility] If 7PM-7AM, please contact night-coverage 05/23/2019, 10:10 PM

## 2019-05-24 LAB — CBC
HCT: 34.7 % — ABNORMAL LOW (ref 36.0–46.0)
Hemoglobin: 11.3 g/dL — ABNORMAL LOW (ref 12.0–15.0)
MCH: 31 pg (ref 26.0–34.0)
MCHC: 32.6 g/dL (ref 30.0–36.0)
MCV: 95.1 fL (ref 80.0–100.0)
Platelets: 219 10*3/uL (ref 150–400)
RBC: 3.65 MIL/uL — ABNORMAL LOW (ref 3.87–5.11)
RDW: 12.4 % (ref 11.5–15.5)
WBC: 7.7 10*3/uL (ref 4.0–10.5)
nRBC: 0 % (ref 0.0–0.2)

## 2019-05-24 LAB — BASIC METABOLIC PANEL
Anion gap: 4 — ABNORMAL LOW (ref 5–15)
BUN: 12 mg/dL (ref 8–23)
CO2: 25 mmol/L (ref 22–32)
Calcium: 8.1 mg/dL — ABNORMAL LOW (ref 8.9–10.3)
Chloride: 110 mmol/L (ref 98–111)
Creatinine, Ser: 0.77 mg/dL (ref 0.44–1.00)
GFR calc Af Amer: >60 mL/min (ref >60)
GFR calc non Af Amer: >60 mL/min (ref >60)
Glucose, Bld: 98 mg/dL (ref 70–99)
Potassium: 3.8 mmol/L (ref 3.5–5.1)
Sodium: 139 mmol/L (ref 135–145)

## 2019-05-24 LAB — MAGNESIUM: Magnesium: 2 mg/dL (ref 1.7–2.4)

## 2019-05-24 MED ORDER — BISACODYL 10 MG RE SUPP
10.0000 mg | Freq: Every day | RECTAL | Status: DC | PRN
  Administered 2019-05-24: 10 mg via RECTAL
  Filled 2019-05-24: qty 1

## 2019-05-24 NOTE — TOC Initial Note (Signed)
Transition of Care [facility]) - Initial/Assessment Note    Patient Details  Name: Christy [name] MRN: [id number] Date of Birth: [birth year]  Transition [facility]) CM/SW Contact:    Su Hilt, RN Phone Number: 05/24/2019, 3:39 PM  Clinical Narrative:                  Met with the patient in the room to discuss DC plan and needs She lives at home with her husband and he provides good care, she has a RW at home she is currently wearing a cervical collar, She was in [facility] previously and does not need to go to rehab,  [facility], I notified Corene Cornea, Her husband provides transportation, she is up to date with PCP and can afford her medications, no additional needs  Expected Discharge Plan: [place] Barriers to Discharge: Continued Medical Work up   Patient Goals and CMS Choice Patient states their goals for this hospitalization and ongoing recovery are:: go home      Expected Discharge Plan and Services Expected Discharge Plan: [place]   Discharge Planning Services: CM Consult   Living arrangements for the past 2 months: Single Family Home                 DME Arranged: N/A         HH Arranged: PT HH Agency: [place] [facility]) Date HH Agency Contacted: 05/24/19 Time HH Agency Contacted: 1538 Representative spoke with at [place]: Corene Cornea  Prior Living Arrangements/Services Living arrangements for the past 2 months: [place] Lives with:: Spouse Patient language and need for interpreter reviewed:: Yes Do you feel safe going back to the place where you live?: Yes      Need for Family Participation in Patient Care: No (Comment) Care giver support system in place?: Yes (comment) Current home services: DME(RW, cervical collar) Criminal Activity/Legal Involvement Pertinent to Current Situation/Hospitalization: No - Comment as needed  Activities of Daily Living Home  Assistive Devices/Equipment: Cane (specify quad or straight) ADL Screening (condition at time of admission) Patient's cognitive ability adequate to safely complete daily activities?: Yes Is the patient deaf or have difficulty hearing?: Yes Does the patient have difficulty seeing, even when wearing glasses/contacts?: No Does the patient have difficulty concentrating, remembering, or making decisions?: Yes Patient able to express need for assistance with ADLs?: Yes Does the patient have difficulty dressing or bathing?: No Independently performs ADLs?: Yes (appropriate for developmental age) Walks in Home: Independent with device (comment) Does the patient have difficulty walking or climbing stairs?: No Weakness of Legs: Both Weakness of Arms/Hands: None  Permission Sought/Granted   Permission granted to share information with : Yes, Verbal Permission Granted              Emotional Assessment Appearance:: Appears stated age Attitude/Demeanor/Rapport: Engaged Affect (typically observed): Appropriate Orientation: : Oriented to Situation, Oriented to  Time, Oriented to Place, Oriented to Self Alcohol / Substance Use: Not Applicable Psych Involvement: No (comment)  Admission diagnosis:  Dehydration [E86.0] Weakness [R53.1] Sepsis ([place]) [A41.9] Urinary tract infection without hematuria, site unspecified [N39.0] Patient Active Problem List   Diagnosis Date Noted  . Sepsis ([place]) 05/22/2019  . Sepsis secondary to UTI ([place]) 05/22/2019  . Hypotension 05/22/2019  . Acute kidney injury superimposed on CKD ([place]) 05/22/2019  . Compression fracture of body of thoracic vertebra ([place]) 05/04/2019  . Compression fracture of C-spine (  [place]) 05/04/2019  . Fall 05/03/2019  . Cystocele, midline 04/28/2017  . Stress incontinence of urine 10/15/2014  . Polypharmacy 09/02/2014  . Primary cancer of right upper lobe of lung ([place]) 07/19/2014  . Acid reflux 07/18/2014  . 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 Chronic kidney disease (CKD), stage III (moderate) 08/30/2013  . Benign essential HTN 08/30/2013  . Fatty liver disease, nonalcoholic 02/71/4232  . Fibrositis 08/30/2013  . Hot flash, menopausal 08/30/2013  . Billowing mitral valve 08/30/2013  . Allergic rhinitis, seasonal 08/30/2013  . Fibromyalgia 08/30/2013   PCP:  Ezequiel Kayser, MD Pharmacy:   [place], [place]. [place] [address] Phone: [phone] Fax: [place], [place] [facility] [address] [place] [facility] [place] [address] Phone: [phone] Fax: [phone]     Social Determinants of Health (SDOH) Interventions    Readmission Risk Interventions No flowsheet data found.

## 2019-05-24 NOTE — Evaluation (Signed)
Physical Therapy Evaluation Patient Details Name: Christy [name] MRN: [id number] DOB: Nov 08, 1940 Today's Date: 05/24/2019   History of Present Illness  79 y.o. female who comes in from SNF 2/2 nausea and UTI/kidney failure.  Was her last month after fall with C2,C3, C7 and compression fracture of T7.  Still in cervical collar and s/p T7 kyphoplasty 05/04/2019.  Clinical Impression  Pt eager to get up and walk with PT did well with today's effort.  She was able to walk ~350 ft with consistent and safe cadence.  She had a few episodes of hitting L wheel on objects (unable to look down secondary to collar, educated on clearing obstacles, rugs, etc at home).  Her O2 stayed in the mid 90s with the effort, HR up to ~110.  Pt does have incontinence and despite stating she did not need to use the purewick before taking it out she lost control of bladder in the first few steps of ambulation.  Pt able to assist with clean up, take socks off and towel legs all w/o increased pain or issues with spinal precautions/positioning.  Pt should be able to safely transition home with HHPT once medically stable.       Follow Up Recommendations Home health PT    Equipment Recommendations  Rolling walker with 5" wheels    Recommendations for Other Services       Precautions / Restrictions Precautions Precautions: Back;Fall;Cervical      Mobility  Bed Mobility Overal bed mobility: Modified Independent Bed Mobility: Supine to Sit     Supine to sit: Min guard     General bed mobility comments: Pt was able to transition to sitting EOB w/o assist, slow but confident effort  Transfers Overall transfer level: Needs assistance Equipment used: Rolling walker (2 wheeled) Transfers: Sit to/from Stand Sit to Stand: Min guard         General transfer comment: cuing for appropriate hand placement (reaching to walker before cuing on all 3 sit to stand transitions)  Ambulation/Gait Ambulation/Gait  assistance: Min guard Gait Distance (Feet): 350 Feet Assistive device: Rolling walker (2 wheeled)       General Gait Details: Pt did well with ambulation, she did have a a few episodes where she ran L wheel into stationary obstacle (likely partly due to inability to look down 2/2 neck collar)  Stairs            Wheelchair Mobility    Modified Rankin (Stroke Patients Only)       Balance Overall balance assessment: Modified Independent Sitting-balance support: No upper extremity supported;Feet supported Sitting balance-Leahy Scale: Good     Standing balance support: Bilateral upper extremity supported;During functional activity Standing balance-Leahy Scale: Fair Standing balance comment: Pt steady with RW during static and dynamic standing tasks                             Pertinent Vitals/Pain Pain Assessment: 0-10 Pain Score: 1  Pain Location: back    Home Living Family/patient expects to be discharged to:: Unsure Living Arrangements: Spouse/significant other;Children Available Help at Discharge: Family;Available 24 hours/day Type of Home: House Home Access: Ramped entrance     Home Layout: Two level Home Equipment: Walker - 2 wheels;Cane - quad;Cane - single point      Prior Function Level of Independence: Independent         Comments: prior to fall (~1 month ago) she used RW sporadically and  was able to walk and drive with no help previously     Hand Dominance        Extremity/Trunk Assessment   Upper Extremity Assessment Upper Extremity Assessment: Generalized weakness;Overall [facility] for tasks assessed    Lower Extremity Assessment Lower Extremity Assessment: Overall WFL for tasks assessed       Communication   Communication: No difficulties  Cognition Arousal/Alertness: Awake/alert Behavior During Therapy: WFL for tasks assessed/performed Overall Cognitive Status: Within Functional Limits for tasks assessed                                         General Comments      Exercises     Assessment/Plan    PT Assessment Patient needs continued PT services  PT Problem List Decreased strength;Decreased range of motion;Decreased activity tolerance;Decreased balance;Decreased mobility;Decreased coordination;Decreased knowledge of use of DME;Decreased safety awareness;Pain       PT Treatment Interventions DME instruction;Gait training;Stair training;Functional mobility training;Therapeutic activities;Therapeutic exercise;Balance training;Neuromuscular re-education;Patient/family education    PT Goals (Current goals can be found in the Care Plan section)  Acute Rehab PT Goals Patient Stated Goal: to go home PT Goal Formulation: With patient Time For Goal Achievement: 06/07/19 Potential to Achieve Goals: Good    Frequency Min 2X/week   Barriers to discharge        Co-evaluation               AM-PAC PT "6 Clicks" Mobility  Outcome Measure Help needed turning from your back to your side while in a flat bed without using bedrails?: None Help needed moving from lying on your back to sitting on the side of a flat bed without using bedrails?: A Little Help needed moving to and from a bed to a chair (including a wheelchair)?: A Little Help needed standing up from a chair using your arms (e.g., wheelchair or bedside chair)?: A Little Help needed to walk in hospital room?: A Little Help needed climbing 3-5 steps with a railing? : A Little 6 Click Score: 19    End of Session Equipment Utilized During Treatment: Gait belt Activity Tolerance: Patient tolerated treatment well Patient left: with chair alarm set;with call bell/phone within reach Nurse Communication: Mobility status PT Visit Diagnosis: Unsteadiness on feet (R26.81);Muscle weakness (generalized) (M62.81);Difficulty in walking, not elsewhere classified (R26.2)    Time: 2297-9892 PT Time Calculation (min) (ACUTE ONLY): 36  min   Charges:   PT Evaluation $PT Eval Low Complexity: 1 Low PT Treatments $Gait Training: 8-22 mins        Kreg Shropshire, DPT 05/24/2019, 1:39 PM

## 2019-05-24 NOTE — Progress Notes (Signed)
PROGRESS NOTE    Christy [name]  EGB:[id number] DOB: Nov 18, 1940 DOA: 05/22/2019 PCP: Ezequiel Kayser, MD    Assessment & Plan:   Principal Problem:   Sepsis secondary to UTI [facility]) Active Problems:   Acid reflux   Compression fracture of C-spine ([place])   Hypotension   Acute kidney injury superimposed on CKD ([place])    Christy [name] is a 79 y.o. Caucasian female with medical history significant for remote right lung cancer s/p resection, CKD stage IIIa, hypertension, mitral valve prolapse, anxiety depression who presents from outpatient clinic for concerns of nausea.  Patient was recently discharged from the hospital on to SNF following a mechanical fall resulting in C2, C3 and 7 process fracture and acute T7 compression fracture s/p kyphoplasty on 3/26.  Since being in the rehab facility, patient says that she is constantly felt nauseous but has never vomited.  She also feels generally uncomfortable throughout her entire abdomen.  States she has lost about 11 pounds since she been in rehab.  She is incontinent at baseline but felt that she had increased urgency.  She told people at rehab that she might have had a UTI but never received any treatment.   UTI Sepsis ruled out Symptom was of urinary frequency.  Mild leukocytosis.  No fever. Continue IV Rocephin Urine culture grew Proteus, sensitivities pending.  Hypotension likely 2/2 dehydration  Now normotensive following 2 L of IV fluids Continue to monitor Hold all antihypertensives  AKI on CKD stage IIIa Presented with creatinine of 1.23.  Now resolved following fluids  History of recent C2, C3 and 7 spinous process fracture following mechanical fall Continue to wear [place] collar Continue PRN pain medication  GERD Continue PPI   DVT prophylaxis: Lovenox SQ Code Status: DNR  Family Communication:  Disposition Plan: Home tomorrow after urine cx sensitivity results.   Subjective and Interval History:   Pt reported feeling better.  Still had some minor nausea this morning, but able to eat full lunch today.  Still has urinary urgency.  No fever, dyspnea, chest pain, abdominal pain, N/V/D, dysuria.  Walked with PT   Objective: Vitals:   05/23/19 1608 05/24/19 0038 05/24/19 0858 05/24/19 1747  BP: 137/72 (!) 141/76 (!) 145/76 135/72  Pulse: 88 92 77 (!) 105  Resp:  16 20 16   Temp: 98 F (36.7 C) 98.4 F (36.9 C) 97.8 F (36.6 C) 98.5 F (36.9 C)  TempSrc: Oral Oral Oral Oral  SpO2: 98% 97% 99% 97%  Weight:      Height:        Intake/Output Summary (Last 24 hours) at 05/24/2019 1839 Last data filed at 05/24/2019 1529 Gross per 24 hour  Intake 2600.92 ml  Output 3500 ml  Net -899.08 ml   Filed Weights   05/22/19 1319 05/22/19 1326  Weight: 67.6 kg 67.6 kg    Examination:   Constitutional: NAD, AAOx3 HEENT: conjunctivae and lids normal, EOMI, neck in brace CV: RRR no M,R,G. Distal pulses +2.  No cyanosis.   RESP: CTA B/L, normal respiratory effort  GI: +BS, NTND Extremities: No effusions, edema, or tenderness in BLE SKIN: warm, dry and intact Neuro: II - XII grossly intact.  Sensation intact Psych: Normal mood and affect.  Appropriate judgement and reason   Data Reviewed: I have personally reviewed following labs and imaging studies  CBC: Recent Labs  Lab 05/22/19 1332 05/23/19 0551 05/24/19 0522  WBC 13.3* 9.6 7.7  HGB 15.1* 11.5* 11.3*  HCT 45.0  34.6* 34.7*  MCV 93.8 93.3 95.1  PLT 317 218 244   Basic Metabolic Panel: Recent Labs  Lab 05/22/19 1332 05/22/19 1910 05/23/19 0551 05/24/19 0522  NA 136 138 138 139  K 4.1 4.6 4.2 3.8  CL 100 106 107 110  CO2 25 25 25 25   GLUCOSE 148* 117* 104* 98  BUN 33* 29* 25* 12  CREATININE 1.23* 0.92 0.89 0.77  CALCIUM 9.6 8.7* 8.0* 8.1*  MG  --   --   --  2.0   GFR: Estimated Creatinine Clearance: 51.3 mL/min (by C-G formula based on SCr of 0.77 mg/dL). Liver Function Tests: No results for input(s): AST,  ALT, ALKPHOS, BILITOT, PROT, ALBUMIN in the last 168 hours. No results for input(s): LIPASE, AMYLASE in the last 168 hours. No results for input(s): AMMONIA in the last 168 hours. Coagulation Profile: Recent Labs  Lab 05/22/19 1332  INR 1.0   Cardiac Enzymes: No results for input(s): CKTOTAL, CKMB, CKMBINDEX, TROPONINI in the last 168 hours. BNP (last 3 results) No results for input(s): PROBNP in the last 8760 hours. HbA1C: No results for input(s): HGBA1C in the last 72 hours. CBG: No results for input(s): GLUCAP in the last 168 hours. Lipid Profile: No results for input(s): CHOL, HDL, LDLCALC, TRIG, CHOLHDL, LDLDIRECT in the last 72 hours. Thyroid Function Tests: No results for input(s): TSH, T4TOTAL, FREET4, T3FREE, THYROIDAB in the last 72 hours. Anemia Panel: No results for input(s): VITAMINB12, FOLATE, FERRITIN, TIBC, IRON, RETICCTPCT in the last 72 hours. Sepsis Labs: Recent Labs  Lab 05/22/19 1334 05/22/19 1716  LATICACIDVEN 1.6 0.8    Recent Results (from the past 240 hour(s))  Culture, blood (Routine x 2)     Status: None (Preliminary result)   Collection Time: 05/22/19  1:38 PM   Specimen: BLOOD RIGHT ARM  Result Value Ref Range Status   Specimen Description BLOOD RIGHT ARM  Final   Special Requests   Final    BOTTLES DRAWN AEROBIC AND ANAEROBIC Blood Culture adequate volume   Culture   Final    NO GROWTH 2 DAYS Performed at [facility], [address] [place], [place] [address]    Report Status PENDING  Incomplete  Urine culture     Status: Abnormal (Preliminary result)   Collection Time: 05/22/19  6:44 PM   Specimen: Urine, Random  Result Value Ref Range Status   Specimen Description   Final    URINE, RANDOM Performed at [facility], [address] [place], [place] [address]    Special Requests   Final    NONE Performed at [facility], [address] [place], [place] [address]    Culture (A)  Final    >=100,000  COLONIES/mL PROTEUS MIRABILIS SUSCEPTIBILITIES TO FOLLOW Performed at [facility], [place] [address], [place], [place] [address]    Report Status PENDING  Incomplete  SARS CORONAVIRUS 2 (TAT 6-24 HRS) Nasopharyngeal Nasopharyngeal Swab     Status: None   Collection Time: 05/22/19  8:23 PM   Specimen: Nasopharyngeal Swab  Result Value Ref Range Status   SARS Coronavirus 2 NEGATIVE NEGATIVE Final    Comment: (NOTE) SARS-CoV-2 target nucleic acids are NOT DETECTED. The SARS-CoV-2 RNA is generally detectable in upper and lower respiratory specimens during the acute phase of infection. Negative results do not preclude SARS-CoV-2 infection, do not rule out co-infections with other pathogens, and should not be used as the sole basis for treatment or other patient management  decisions. Negative results must be combined with clinical observations, patient history, and epidemiological information. The expected result is Negative. Fact Sheet for Patients: SugarRoll.be Fact Sheet for Healthcare Providers: https://www.woods-mathews.com/ This test is not yet approved or cleared by the [place] FDA and  has been authorized for detection and/or diagnosis of SARS-CoV-2 by FDA under an Emergency Use Authorization (EUA). This EUA will remain  in effect (meaning this test can be used) for the duration of the COVID-19 declaration under Section 56 4(b)(1) of the Act, 21 U.S.C. section 360bbb-3(b)(1), unless the authorization is terminated or revoked sooner. Performed at [facility], [place] [address], [place],  [address]       Radiology Studies: No results found.   Scheduled Meds: . DULoxetine  20 mg Oral Daily  . enoxaparin (LOVENOX) injection  40 mg Subcutaneous Q24H  . estradiol  1 mg Oral Daily  . nortriptyline  20 mg Oral QHS  . pantoprazole  40 mg Oral Daily  . polyethylene glycol  17 g Oral BID  . vitamin B-12  1,000 mcg  Oral Daily   Continuous Infusions: . sodium chloride 75 mL/hr at 05/24/19 1800  . cefTRIAXone (ROCEPHIN)  IV Stopped (05/24/19 5462)     LOS: 2 days     Enzo Bi, MD [facility] If 7PM-7AM, please contact night-coverage 05/24/2019, 6:39 PM

## 2019-05-25 LAB — BASIC METABOLIC PANEL
Anion gap: 8 (ref 5–15)
BUN: 10 mg/dL (ref 8–23)
CO2: 24 mmol/L (ref 22–32)
Calcium: 8 mg/dL — ABNORMAL LOW (ref 8.9–10.3)
Chloride: 107 mmol/L (ref 98–111)
Creatinine, Ser: 0.51 mg/dL (ref 0.44–1.00)
GFR calc Af Amer: >60 mL/min (ref >60)
GFR calc non Af Amer: >60 mL/min (ref >60)
Glucose, Bld: 95 mg/dL (ref 70–99)
Potassium: 3.9 mmol/L (ref 3.5–5.1)
Sodium: 139 mmol/L (ref 135–145)

## 2019-05-25 LAB — CBC
HCT: 36 % (ref 36.0–46.0)
Hemoglobin: 12 g/dL (ref 12.0–15.0)
MCH: 30.7 pg (ref 26.0–34.0)
MCHC: 33.3 g/dL (ref 30.0–36.0)
MCV: 92.1 fL (ref 80.0–100.0)
Platelets: 225 10*3/uL (ref 150–400)
RBC: 3.91 MIL/uL (ref 3.87–5.11)
RDW: 12.4 % (ref 11.5–15.5)
WBC: 9.4 10*3/uL (ref 4.0–10.5)
nRBC: 0 % (ref 0.0–0.2)

## 2019-05-25 LAB — URINE CULTURE: Culture: >=100000 — AB

## 2019-05-25 LAB — MAGNESIUM: Magnesium: 2.1 mg/dL (ref 1.7–2.4)

## 2019-05-25 MED ORDER — OXYBUTYNIN CHLORIDE 5 MG PO TABS
2.5000 mg | ORAL_TABLET | Freq: Two times a day (BID) | ORAL | Status: DC
  Administered 2019-05-25: 2.5 mg via ORAL
  Filled 2019-05-25 (×2): qty 0.5

## 2019-05-25 MED ORDER — PREDNISONE 5 MG PO TABS: ORAL_TABLET | ORAL | Status: DC

## 2019-05-25 MED ORDER — SULFAMETHOXAZOLE-TRIMETHOPRIM 800-160 MG PO TABS: 1.0000 | ORAL_TABLET | Freq: Two times a day (BID) | ORAL | Status: DC

## 2019-05-25 MED ORDER — OXYBUTYNIN CHLORIDE 5 MG PO TABS: 2.5000 mg | ORAL_TABLET | Freq: Two times a day (BID) | ORAL | 0 refills | Status: AC

## 2019-05-25 NOTE — Care Management Important Message (Signed)
Important Message  Patient Details  Name: Christy [name] MRN: [id number] Date of Birth: April 09, 1940   Medicare Important Message Given:  Yes     Juliann Pulse A Deundra Bard 05/25/2019, 10:43 AM

## 2019-05-25 NOTE — TOC Progression Note (Addendum)
Transition of Care [facility]) - Progression Note    Patient Details  Name: Christy [name] MRN: [id number] Date of Birth: 1940-08-06  Transition of Care [facility]) CM/SW Contact  Su Hilt, RN Phone Number: 05/25/2019, 11:22 AM  Clinical Narrative:    [facility] called to notify me that they are not able to see the patient until Monday, I notified the physician, she has walked 350 ft with PT here in the hospital, Dr says this is appropriate, and ok   Expected Discharge Plan: [place] Barriers to Discharge: Continued Medical Work up  Expected Discharge Plan and Services Expected Discharge Plan: [place]   Discharge Planning Services: CM Consult   Living arrangements for the past 2 months: Single Family Home Expected Discharge Date: 05/25/19               DME Arranged: N/A         HH Arranged: PT HH Agency: [facility] ([place]) Date HH Agency Contacted: 05/24/19 Time [place]: [place] Representative spoke with at [place]: [place] ([place]) Interventions    Readmission Risk Interventions No flowsheet data found.

## 2019-05-25 NOTE — Discharge Summary (Signed)
Physician Discharge Summary   Christy [name]  female DOB: 13-Apr-1940  VOH:[id number]  PCP: Ezequiel Kayser, MD  Admit date: 05/22/2019 Discharge date: 05/25/2019  Admitted From: home Disposition:  home Home Health: Yes CODE STATUS: DNR  Discharge Instructions    Diet - low sodium heart healthy   Complete by: As directed    Discharge instructions   Complete by: As directed    The bacteria from your urine culture is sensitive to the IV Ceftriaxone you have been receiving for 3 days, which completes the treatment.  The fact that your urinary urgency has not improved with treatment of your UTI, then your symptom is unlikely to be due to UTI.  I have prescribed Ditropan to control bladder spasm, which may help with your urinary urgency.  I have started you on a low dose, please see your PCP to titrate up the dose if necessary.  And as we discussed, your urine will likely always have bacteria in it.  Avoid treating it with antibiotics if you don't have symptoms like painful urination, bladder pain, or fever.  Urinary urgency should not be considered a UTI symptom for you.   Dr. Enzo Bi - -   Increase activity slowly   Complete by: As directed        Hospital Course:  For full details, please see H&P, progress notes, consult notes and ancillary notes.  Briefly,  Christy Fennewald Stinsonis a 79 y.o.Caucasian femalewith medical history significant forremote right lung cancer s/p resection, CKD stage IIIa, hypertension, mitral valve prolapse, anxiety depression who presented from outpatient clinic for concerns of nausea.  Patient was recently discharged from the hospital on to SNF following a mechanical fall resulting in C2, C3 and 7 process fracture and acute T7 compression fracture s/p kyphoplasty on 3/26.  She is incontinent at baseline but felt that she had increased urgency. She told people at rehab that she might have had a UTI but never received any treatment.   UTI,  treated Sepsis ruled out Likely overactive bladder Symptom was of urinary urgency and frequency only.  Mild leukocytosis.  No fever. Urine culture grew Proteus sensitive to Rocephin.  Pt received 3 days of IV Rocephin with no improvement in urinary urgency, so symptom unlikely to be due to UTI.  Pt was started on Ditropan for possible overactive bladder.  Pt was instructed to follow up with PCP to titrate up the Ditropan per response.  Hypotension likely 2/2 dehydration  BP normalized following 2 L of IV fluids.  AKI on CKD stage IIIa Presented with creatinine of 1.23. Resolved following fluids.  Prior to discharge, Cr 0.51.  History of recent C2, C3 and 7 spinous process fracture following mechanical fall Continue to wear [place] collar Continue PRN pain medication  GERD Continued PPI   Discharge Diagnoses:  Principal Problem:   Sepsis secondary to UTI [facility]) Active Problems:   Acid reflux   Compression fracture of C-spine (HCC)   Hypotension   Acute kidney injury superimposed on CKD [facility])    Discharge Instructions:  Allergies as of 05/25/2019      Reactions   Alendronate Other (See Comments)   Esophageal burning   Sulfa Antibiotics Other (See Comments)   Ciprofloxacin Other (See Comments)   Other Reaction: GI UPSET   Diphenhydramine Other (See Comments)      Medication List    STOP taking these medications   predniSONE 5 MG tablet Commonly known as: DELTASONE  TAKE these medications   benazepril 5 MG tablet Commonly known as: LOTENSIN Take 1 tablet by mouth daily.   calcium citrate-vitamin D 500-400 MG-UNIT chewable tablet Chew 1 tablet by mouth daily.   DULoxetine 20 MG capsule Commonly known as: CYMBALTA Take 1 capsule by mouth daily.   estradiol 1 MG tablet Commonly known as: ESTRACE Take 1 mg by mouth daily.   HYDROcodone-acetaminophen 5-325 MG tablet Commonly known as: NORCO/VICODIN Take 2 tablets by mouth every 6 (six) hours as needed  for moderate pain.   nortriptyline 10 MG capsule Commonly known as: PAMELOR Take 20 mg by mouth at bedtime.   OCUVITE ADULT 50+ PO Take 1 tablet by mouth daily.   omeprazole 40 MG capsule Commonly known as: PRILOSEC Take 40 mg by mouth daily.   oxybutynin 5 MG tablet Commonly known as: DITROPAN Take 0.5 tablets (2.5 mg total) by mouth 2 (two) times daily. This is to help with urinary urgency.  You can have your PCP titrate up the dose.   polyethylene glycol 17 g packet Commonly known as: MIRALAX / GLYCOLAX Take 17 g by mouth 2 (two) times daily.   senna-docusate 8.6-50 MG tablet Commonly known as: Senokot-S Take 2 tablets by mouth daily as needed for mild constipation.   traMADol 50 MG tablet Commonly known as: ULTRAM Take 50 mg by mouth every 12 (twelve) hours as needed.   vitamin B-12 1000 MCG tablet Commonly known as: CYANOCOBALAMIN Take 1,000 mcg by mouth daily.       Follow-up Information    Ezequiel Kayser, MD. Schedule an appointment as soon as possible for a visit in 1 week(s).   Specialty: Internal Medicine Contact information: [place] [address] [phone]           Allergies  Allergen Reactions   Alendronate Other (See Comments)    Esophageal burning   Sulfa Antibiotics Other (See Comments)   Ciprofloxacin Other (See Comments)    Other Reaction: GI UPSET   Diphenhydramine Other (See Comments)     The results of significant diagnostics from this hospitalization (including imaging, microbiology, ancillary and laboratory) are listed below for reference.   Consultations:   Procedures/Studies: DG Chest 2 View  Result Date: 05/03/2019 CLINICAL DATA:  [age]-year-old female with fall and back pain. EXAM: CHEST - 2 VIEW COMPARISON:  Chest radiograph dated 12/05/2016 and chest CT of 02/24/2018. FINDINGS: There is no focal consolidation, pleural effusion, pneumothorax. The cardiac silhouette is  within limits. The aorta is slightly tortuous. There is compression fracture of a midthoracic vertebra with anterior wedging and approximately 50% loss of vertebral body height, new since the prior chest CT. Bilateral breast implants noted. IMPRESSION: 1. No acute cardiopulmonary process. 2. Age indeterminate midthoracic compression fracture, new since the prior studies. Electronically Signed   By: Anner Crete M.D.   On: 05/03/2019 16:24   DG Thoracic Spine 2 View  Result Date: 05/04/2019 CLINICAL DATA:  Kyphoplasty at T7 EXAM: THORACIC SPINE 2 VIEWS; DG C-ARM 1-60 MIN COMPARISON:  Thoracic radiographs May 03, 2019 FLUOROSCOPY TIME:  2 minutes 3 seconds; 2 acquired images FINDINGS: Frontal and lateral views were obtained intraoperatively over the T7 region. Patient is status post kyphoplasty at T7 with less wedging at T7 compared to precontrast administration. Slight extravasation of contrast is noted posteriorly. Adjacent vertebral bodies appear unremarkable. No spondylolisthesis. Slight disc space narrowing noted at T6-7. IMPRESSION: Contrast administration for kyphoplasty at T7 with slight extravasation posteriorly.  Less collapse at T7 currently compared to precontrast images. Nearby vertebral bodies elsewhere appear unremarkable. Electronically Signed   By: Lowella Grip III M.D.   On: 05/04/2019 13:44   DG Thoracic Spine 2 View  Result Date: 05/03/2019 CLINICAL DATA:  T2 and T7 point tenderness from fall. EXAM: THORACIC SPINE 2 VIEWS COMPARISON:  CT 02/24/2018 FINDINGS: Subtle curvature of the thoracic spine convex right. Pedicles are intact. There is a moderate compression fracture over the midthoracic spine likely the T7 level. Age indeterminate, but is new since the previous CT scan. Remainder the exam is unremarkable. IMPRESSION: Moderate compression fracture over the midthoracic spine likely the T7 level, age indeterminate but new since the previous CT scan. Electronically Signed   By:  Marin Olp M.D.   On: 05/03/2019 16:27   CT Head Wo Contrast  Addendum Date: 05/03/2019   ADDENDUM REPORT: 05/03/2019 17:14 ADDENDUM: Critical Value/emergent results were called by telephone at the time of interpretation on 05/03/2019 at 5:14 pm to provider [facility] , who verbally acknowledged these results. Electronically Signed   By: Marin Olp M.D.   On: 05/03/2019 17:14   Result Date: 05/03/2019 CLINICAL DATA:  Fall with right occipital hematoma and neck pain. EXAM: CT HEAD WITHOUT CONTRAST CT CERVICAL SPINE WITHOUT CONTRAST TECHNIQUE: Multidetector CT imaging of the head and cervical spine was performed following the standard protocol without intravenous contrast. Multiplanar CT image reconstructions of the cervical spine were also generated. COMPARISON:  Head CT 08/26/2018 FINDINGS: CT HEAD FINDINGS Brain: Ventricles, cisterns and other CSF spaces are normal. There is moderate chronic ischemic microvascular disease. There is no mass, mass effect, shift of midline structures or acute hemorrhage. No evidence of acute infarction. Vascular: No hyperdense vessel or unexpected calcification. Skull: Normal. Negative for fracture or focal lesion. Sinuses/Orbits: No acute finding. Other: None. CT CERVICAL SPINE FINDINGS Alignment: Normal. Skull base and vertebrae: Vertebral body alignment and heights are normal. There is mild spondylosis of the cervical spine to include facet arthropathy and uncovertebral joint spurring. Small Schmorl's node over the superior endplate of C7. Atlantoaxial articulation is unremarkable. Minimally displaced fracture involving the base of the C2 spinous process extending towards the posterior aspect of the lamina bilaterally. Minimally displaced fracture of the base of the C3 spinous process. Minimally displaced fracture of the tip of the C7 spinous process. Soft tissues and spinal canal: No prevertebral fluid or swelling. No visible canal hematoma. Disc  levels:  Normal. Upper chest: Negative. Other: None. IMPRESSION: 1.  No acute brain injury. 2.  Moderate chronic ischemic microvascular disease. 3. Minimally displaced fractures at the base of the spinous processes of C2 and C3 as well as tip of the spinous process of C7. 4.  Mild spondylosis of the cervical spine. Electronically Signed: By: Marin Olp M.D. On: 05/03/2019 16:52   CT Cervical Spine Wo Contrast  Addendum Date: 05/03/2019   ADDENDUM REPORT: 05/03/2019 17:14 ADDENDUM: Critical Value/emergent results were called by telephone at the time of interpretation on 05/03/2019 at 5:14 pm to provider [facility] , who verbally acknowledged these results. Electronically Signed   By: Marin Olp M.D.   On: 05/03/2019 17:14   Result Date: 05/03/2019 CLINICAL DATA:  Fall with right occipital hematoma and neck pain. EXAM: CT HEAD WITHOUT CONTRAST CT CERVICAL SPINE WITHOUT CONTRAST TECHNIQUE: Multidetector CT imaging of the head and cervical spine was performed following the standard protocol without intravenous contrast. Multiplanar CT image reconstructions of the cervical spine were also generated. COMPARISON:  Head CT 08/26/2018 FINDINGS: CT HEAD FINDINGS Brain: Ventricles, cisterns and other CSF spaces are normal. There is moderate chronic ischemic microvascular disease. There is no mass, mass effect, shift of midline structures or acute hemorrhage. No evidence of acute infarction. Vascular: No hyperdense vessel or unexpected calcification. Skull: Normal. Negative for fracture or focal lesion. Sinuses/Orbits: No acute finding. Other: None. CT CERVICAL SPINE FINDINGS Alignment: Normal. Skull base and vertebrae: Vertebral body alignment and heights are normal. There is mild spondylosis of the cervical spine to include facet arthropathy and uncovertebral joint spurring. Small Schmorl's node over the superior endplate of C7. Atlantoaxial articulation is unremarkable. Minimally displaced  fracture involving the base of the C2 spinous process extending towards the posterior aspect of the lamina bilaterally. Minimally displaced fracture of the base of the C3 spinous process. Minimally displaced fracture of the tip of the C7 spinous process. Soft tissues and spinal canal: No prevertebral fluid or swelling. No visible canal hematoma. Disc levels:  Normal. Upper chest: Negative. Other: None. IMPRESSION: 1.  No acute brain injury. 2.  Moderate chronic ischemic microvascular disease. 3. Minimally displaced fractures at the base of the spinous processes of C2 and C3 as well as tip of the spinous process of C7. 4.  Mild spondylosis of the cervical spine. Electronically Signed: By: Marin Olp M.D. On: 05/03/2019 16:52   CT Thoracic Spine Wo Contrast  Result Date: 05/03/2019 CLINICAL DATA:  Fall. Severe neck and thoracic spine pain with a thoracic compression fracture on chest radiographs. EXAM: CT THORACIC SPINE WITHOUT CONTRAST TECHNIQUE: Multidetector CT images of the thoracic were obtained using the standard protocol without intravenous contrast. COMPARISON:  Chest radiographs 05/03/2019. Chest CT 05/16/2017. FINDINGS: Alignment: Exaggerated upper thoracic kyphosis. No listhesis. Vertebrae: Mildly comminuted T7 vertebral body fracture with involvement of the anterior and middle columns and approximately 50% anterior vertebral body height loss without retropulsion. Chronic right-sided rib deformities. No suspicious osseous lesion. Paraspinal and other soft tissues: Mild swelling/small volume paravertebral hematoma at T7. Bilateral breast implants. Aortic and coronary artery atherosclerosis. Previous right upper lobectomy. Disc levels: No significant degenerative changes in the thoracic spine. IMPRESSION: Mildly comminuted 2-column T7 vertebral body fracture with 50% height loss. Electronically Signed   By: Logan Bores M.D.   On: 05/03/2019 17:01   DG C-Arm 1-60 Min  Result Date:  05/04/2019 CLINICAL DATA:  Kyphoplasty at T7 EXAM: THORACIC SPINE 2 VIEWS; DG C-ARM 1-60 MIN COMPARISON:  Thoracic radiographs May 03, 2019 FLUOROSCOPY TIME:  2 minutes 3 seconds; 2 acquired images FINDINGS: Frontal and lateral views were obtained intraoperatively over the T7 region. Patient is status post kyphoplasty at T7 with less wedging at T7 compared to precontrast administration. Slight extravasation of contrast is noted posteriorly. Adjacent vertebral bodies appear unremarkable. No spondylolisthesis. Slight disc space narrowing noted at T6-7. IMPRESSION: Contrast administration for kyphoplasty at T7 with slight extravasation posteriorly. Less collapse at T7 currently compared to precontrast images. Nearby vertebral bodies elsewhere appear unremarkable. Electronically Signed   By: Lowella Grip III M.D.   On: 05/04/2019 13:44      Labs: BNP (last 3 results) No results for input(s): BNP in the last 8760 hours. Basic Metabolic Panel: Recent Labs  Lab 05/22/19 1332 05/22/19 1910 05/23/19 0551 05/24/19 0522 05/25/19 0535  NA 136 138 138 139 139  K 4.1 4.6 4.2 3.8 3.9  CL 100 106 107 110 107  CO2 25 25 25 25 24   GLUCOSE 148* 117* 104* 98 95  BUN 33* 29*  25* 12 10  CREATININE 1.23* 0.92 0.89 0.77 0.51  CALCIUM 9.6 8.7* 8.0* 8.1* 8.0*  MG  --   --   --  2.0 2.1   Liver Function Tests: No results for input(s): AST, ALT, ALKPHOS, BILITOT, PROT, ALBUMIN in the last 168 hours. No results for input(s): LIPASE, AMYLASE in the last 168 hours. No results for input(s): AMMONIA in the last 168 hours. CBC: Recent Labs  Lab 05/22/19 1332 05/23/19 0551 05/24/19 0522 05/25/19 0535  WBC 13.3* 9.6 7.7 9.4  HGB 15.1* 11.5* 11.3* 12.0  HCT 45.0 34.6* 34.7* 36.0  MCV 93.8 93.3 95.1 92.1  PLT 317 218 219 225   Cardiac Enzymes: No results for input(s): CKTOTAL, CKMB, CKMBINDEX, TROPONINI in the last 168 hours. BNP: Invalid input(s): POCBNP CBG: No results for input(s): GLUCAP in the  last 168 hours. D-Dimer No results for input(s): DDIMER in the last 72 hours. Hgb A1c No results for input(s): HGBA1C in the last 72 hours. Lipid Profile No results for input(s): CHOL, HDL, LDLCALC, TRIG, CHOLHDL, LDLDIRECT in the last 72 hours. Thyroid function studies No results for input(s): TSH, T4TOTAL, T3FREE, THYROIDAB in the last 72 hours.  Invalid input(s): FREET3 Anemia work up No results for input(s): VITAMINB12, FOLATE, FERRITIN, TIBC, IRON, RETICCTPCT in the last 72 hours. Urinalysis    Component Value Date/Time   COLORURINE YELLOW (A) 05/22/2019 1844   APPEARANCEUR HAZY (A) 05/22/2019 1844   APPEARANCEUR Clear 10/15/2014 1143   LABSPEC 1.010 05/22/2019 1844   LABSPEC see comment 03/18/2012 1057   PHURINE 8.0 05/22/2019 1844   GLUCOSEU NEGATIVE 05/22/2019 1844   GLUCOSEU see comment 03/18/2012 1057   HGBUR NEGATIVE 05/22/2019 1844   BILIRUBINUR NEGATIVE 05/22/2019 1844   BILIRUBINUR neg 01/09/2019 1106   BILIRUBINUR Negative 10/15/2014 1143   BILIRUBINUR see comment 03/18/2012 Nina 05/22/2019 1844   PROTEINUR NEGATIVE 05/22/2019 1844   UROBILINOGEN 0.2 01/09/2019 1106   NITRITE POSITIVE (A) 05/22/2019 1844   LEUKOCYTESUR LARGE (A) 05/22/2019 1844   LEUKOCYTESUR see comment 03/18/2012 1057   Sepsis Labs Invalid input(s): PROCALCITONIN,  WBC,  LACTICIDVEN Microbiology Recent Results (from the past 240 hour(s))  Culture, blood (Routine x 2)     Status: None (Preliminary result)   Collection Time: 05/22/19  1:38 PM   Specimen: BLOOD RIGHT ARM  Result Value Ref Range Status   Specimen Description BLOOD RIGHT ARM  Final   Special Requests   Final    BOTTLES DRAWN AEROBIC AND ANAEROBIC Blood Culture adequate volume   Culture   Final    NO GROWTH 3 DAYS Performed at [facility], [address] [place], [place] [address]    Report Status PENDING  Incomplete  Urine culture     Status: Abnormal   Collection Time: 05/22/19  6:44  PM   Specimen: Urine, Random  Result Value Ref Range Status   Specimen Description   Final    URINE, RANDOM Performed at [facility], [address], [place], [place] [address]    Special Requests   Final    NONE Performed at [facility], [address] [place], [place] [address]    Culture >=100,000 COLONIES/mL PROTEUS MIRABILIS (A)  Final   Report Status 05/25/2019 FINAL  Final   Organism ID, Bacteria PROTEUS MIRABILIS (A)  Final      Susceptibility   Proteus mirabilis - MIC*    AMPICILLIN <=2 SENSITIVE Sensitive     CEFAZOLIN <=4 SENSITIVE  Sensitive     CEFTRIAXONE <=0.25 SENSITIVE Sensitive     CIPROFLOXACIN <=0.25 SENSITIVE Sensitive     GENTAMICIN <=1 SENSITIVE Sensitive     IMIPENEM 2 SENSITIVE Sensitive     NITROFURANTOIN 128 RESISTANT Resistant     TRIMETH/SULFA <=20 SENSITIVE Sensitive     AMPICILLIN/SULBACTAM <=2 SENSITIVE Sensitive     PIP/TAZO <=4 SENSITIVE Sensitive     * >=100,000 COLONIES/mL PROTEUS MIRABILIS  SARS CORONAVIRUS 2 (TAT 6-24 HRS) Nasopharyngeal Nasopharyngeal Swab     Status: None   Collection Time: 05/22/19  8:23 PM   Specimen: Nasopharyngeal Swab  Result Value Ref Range Status   SARS Coronavirus 2 NEGATIVE NEGATIVE Final    Comment: (NOTE) SARS-CoV-2 target nucleic acids are NOT DETECTED. The SARS-CoV-2 RNA is generally detectable in upper and lower respiratory specimens during the acute phase of infection. Negative results do not preclude SARS-CoV-2 infection, do not rule out co-infections with other pathogens, and should not be used as the sole basis for treatment or other patient management decisions. Negative results must be combined with clinical observations, patient history, and epidemiological information. The expected result is Negative. Fact Sheet for Patients: SugarRoll.be Fact Sheet for Healthcare Providers: https://www.woods-mathews.com/ This test is not yet  approved or cleared by the [place] FDA and  has been authorized for detection and/or diagnosis of SARS-CoV-2 by FDA under an Emergency Use Authorization (EUA). This EUA will remain  in effect (meaning this test can be used) for the duration of the COVID-19 declaration under Section 56 4(b)(1) of the Act, 21 U.S.C. section 360bbb-3(b)(1), unless the authorization is terminated or revoked sooner. Performed at [facility], [place] [address], [place], [place] [address]      Total time spend on discharging this patient, including the last patient exam, discussing the hospital stay, instructions for ongoing care as it relates to all pertinent caregivers, as well as preparing the medical discharge records, prescriptions, and/or referrals as applicable, is 40 minutes.    Enzo Bi, MD  [facility] 05/25/2019, 4:00 PM  If 7PM-7AM, please contact night-coverage

## 2019-05-25 NOTE — Progress Notes (Signed)
Physical Therapy Treatment Patient Details Name: Christy [name] MRN: [id number] DOB: 28-Sep-1940 Today's Date: 05/25/2019    History of Present Illness 79 y.o. female who comes in from SNF 2/2 nausea and UTI/kidney failure.  Was her last month after fall with C2,C3, C7 and compression fracture of T7.  Still in cervical collar and s/p T7 kyphoplasty 05/04/2019.    PT Comments    Pt eager to walk with PT and needed education on appropriate stair negotiation to safely enter/exit home.  She did have some hesitancy and one backward LOB during 2 rounds of up/down 4 steps.  Overall she was able to safely negotiate them but did need constant cuing for slow, deliberate effort with appropriate awareness for postioning, foot placement, etc.  She felt confident after second effort, and assures me that both daughter and grandson will be there when she is getting in the home.  Follow Up Recommendations  Home health PT     Equipment Recommendations  Rolling walker with 5" wheels    Recommendations for Other Services       Precautions / Restrictions Precautions Precautions: Back;Fall;Cervical Restrictions Weight Bearing Restrictions: No    Mobility  Bed Mobility Overal bed mobility: Modified Independent Bed Mobility: Supine to Sit Rolling: Min assist   Supine to sit: Min guard     General bed mobility comments: Pt was able to transition to sitting EOB w/o assist, slow but confident effort  Transfers Overall transfer level: Needs assistance Equipment used: Rolling walker (2 wheeled) Transfers: Sit to/from Stand Sit to Stand: Min guard         General transfer comment: Pt again needing consistent cuing for hand placement  Ambulation/Gait Ambulation/Gait assistance: Min guard Gait Distance (Feet): 125 Feet Assistive device: Rolling walker (2 wheeled)       General Gait Details: Pt able to maintian consistent cadence and good safety with ~125 ft of ambulation.  Pt did have  elevated HR to 120s with the effort   Stairs Stairs: Yes Stairs assistance: Min assist Stair Management: No rails;Backwards;With walker Number of Stairs: 4 General stair comments: up/down 4 steps X 2.  Pt did have on LOB backward while trying to place walker on lower step - did need direct assist to maintain upright.  She was able to negotiate up/down reasonably well though she did need consistent reminders about being slower and more deliberate with how she places walker, feet, etc   Wheelchair Mobility    Modified Rankin (Stroke Patients Only)       Balance Overall balance assessment: Modified Independent Sitting-balance support: No upper extremity supported;Feet supported Sitting balance-Leahy Scale: Good     Standing balance support: Bilateral upper extremity supported;During functional activity Standing balance-Leahy Scale: Fair Standing balance comment: Pt did have one LOB on the steps while trying to place walker                            Cognition Arousal/Alertness: Awake/alert Behavior During Therapy: WFL for tasks assessed/performed Overall Cognitive Status: Within Functional Limits for tasks assessed                                 General Comments: slow to respond to questions, concerning about her awareness of being in need of using [facility]      Exercises      General Comments  Pertinent Vitals/Pain Pain Score: 0-No pain    Home Living                      Prior Function            PT Goals (current goals can now be found in the care plan section) Progress towards PT goals: Progressing toward goals    Frequency    Min 2X/week      PT Plan Current plan remains appropriate    Co-evaluation              AM-PAC PT "6 Clicks" Mobility   Outcome Measure  Help needed turning from your back to your side while in a flat bed without using bedrails?: None Help needed moving from lying on your back to  sitting on the side of a flat bed without using bedrails?: A Little Help needed moving to and from a bed to a chair (including a wheelchair)?: A Little Help needed standing up from a chair using your arms (e.g., wheelchair or bedside chair)?: A Little Help needed to walk in hospital room?: A Little Help needed climbing 3-5 steps with a railing? : A Little 6 Click Score: 19    End of Session Equipment Utilized During Treatment: Gait belt Activity Tolerance: Patient tolerated treatment well Patient left: with chair alarm set;with call bell/phone within reach Nurse Communication: Mobility status PT Visit Diagnosis: Unsteadiness on feet (R26.81);Muscle weakness (generalized) (M62.81);Difficulty in walking, not elsewhere classified (R26.2)     Time: 2426-8341 PT Time Calculation (min) (ACUTE ONLY): 34 min  Charges:  $Gait Training: 23-37 mins                     Kreg Shropshire, DPT 05/25/2019, 1:37 PM

## 2019-05-25 NOTE — TOC Progression Note (Signed)
Transition of Care [facility]) - Progression Note    Patient Details  Name: Christy [name] MRN: [id number] Date of Birth: 1940-06-02  [facility] CM/SW Contact  Su Hilt, RN Phone Number: 05/25/2019, 11:34 AM  Clinical Narrative:    I notified [place] with PT that the physician wants the patent to do the Steps with PT today before she goes home,    Expected Discharge Plan: [place] Barriers to Discharge: Continued Medical Work up  Expected Discharge Plan and Services Expected Discharge Plan: [place]   Discharge Planning Services: CM Consult   Living arrangements for the past 2 months: Single Family Home Expected Discharge Date: 05/25/19               DME Arranged: N/A         HH Arranged: PT HH Agency: [place] ([place]) Date [place]: 05/24/19 Time [place]: [place] Representative spoke with at [place]: [place] ([place]) Interventions    Readmission Risk Interventions No flowsheet data found.

## 2019-05-25 NOTE — Progress Notes (Signed)
Pt left via w/c to home with dtr. A/o. No distress or c/o. Sl dc.d. instructions to pt and dtr re meds  Diet activity and f/u. Verbalizes understanding.

## 2019-05-25 NOTE — Plan of Care (Signed)

## 2019-05-27 LAB — CULTURE, BLOOD (ROUTINE X 2)
Culture: NO GROWTH
Special Requests: ADEQUATE

## 2019-06-06 ENCOUNTER — Encounter: Payer: Self-pay | Admitting: Obstetrics & Gynecology

## 2019-06-06 ENCOUNTER — Other Ambulatory Visit: Payer: Self-pay

## 2019-06-06 ENCOUNTER — Ambulatory Visit (INDEPENDENT_AMBULATORY_CARE_PROVIDER_SITE_OTHER): Payer: Medicare Other | Admitting: Obstetrics & Gynecology

## 2019-06-06 ENCOUNTER — Ambulatory Visit
Admission: RE | Admit: 2019-06-06 | Discharge: 2019-06-06 | Disposition: A | Discharge status: Home / Self Care | Payer: Medicare Other | Source: Ambulatory Visit | Attending: Student | Admitting: Student

## 2019-06-06 VITALS — Ht 66.0 in | Wt 154.0 lb

## 2019-06-06 DIAGNOSIS — R350 Frequency of micturition: Secondary | ICD-10-CM

## 2019-06-06 DIAGNOSIS — S129XXD Fracture of neck, unspecified, subsequent encounter: Secondary | ICD-10-CM | POA: Diagnosis not present

## 2019-06-06 DIAGNOSIS — N8111 Cystocele, midline: Secondary | ICD-10-CM | POA: Diagnosis not present

## 2019-06-06 NOTE — Progress Notes (Signed)
HPI:      [name] is a 79 y.o. who presents today for her pessary follow up and examination related to her pelvic floor weakening.  Pt reports tolerating the pessary well with no vaginal bleeding and occas vaginal discharge.  Symptoms of pelvic floor weakening have greatly improved. She is voiding and defecating without difficulty. She currently has a Incontinence Ring #5 pessary.  Recent falll w spinal fracture and surgery, recovering well now after rehab.  PMHx: She  has a past medical history of Allergy, Anxiety, Depression, Fatty liver disease, nonalcoholic, GERD (gastroesophageal reflux disease), Heart disease, Hypertension, Lung cancer ([place]) (02/2014), and Urinary incontinence. Also,  has a past surgical history that includes Laryngoscopy (N/A, 07/22/2014); Laparoscopic hysterectomy (1977); Lung cancer surgery; Abdominal hysterectomy; and Kyphoplasty (N/A, 05/04/2019)., family history includes Cancer in her maternal grandfather and maternal grandmother; Kidney cancer in her cousin.,  reports that she quit smoking about 12 years ago. Her smoking use included cigarettes. She has a 12.50 pack-year smoking history. She has never used smokeless tobacco. She reports current alcohol use of about 2.0 standard drinks of alcohol per week. She reports that she does not use drugs.  She has a current medication list which includes the following prescription(s): benazepril, calcium citrate-vitamin d, duloxetine, estradiol, hydrocodone-acetaminophen, multiple vitamins-minerals, nortriptyline, omeprazole, oxybutynin, polyethylene glycol, senna-docusate, tramadol, and vitamin b-12. Also, is allergic to alendronate; sulfa antibiotics; ciprofloxacin; and diphenhydramine.  Review of Systems  All other systems reviewed and are negative.   Objective: Ht 5\' 6"  (1.676 m)   Wt 154 lb (69.9 kg)   BMI 24.86 kg/m  Physical Exam Constitutional:      General: She is not in acute distress.    Appearance:  She is well-developed.  Genitourinary:     Pelvic exam was performed with patient supine.     Vagina normal.     No vaginal erythema or bleeding.     Genitourinary Comments: Cuff intact/ no lesions  Absent uterus and cervix  HENT:     Head: Normocephalic and atraumatic.     Nose: Nose normal.  Abdominal:     General: There is no distension.     Palpations: Abdomen is soft.     Tenderness: There is no abdominal tenderness.  Musculoskeletal:        General: Normal range of motion.  Neurological:     Mental Status: She is alert and oriented to person, place, and time.     Cranial Nerves: No cranial nerve deficit.  Skin:    General: Skin is warm and dry.  Psychiatric:        Attention and Perception: Attention normal.        Mood and Affect: Mood normal.        Speech: Speech normal.        Behavior: Behavior normal.        Cognition and Memory: Cognition normal.        Judgment: Judgment normal.    Pessary Care Pessary removed and cleaned.  Vagina checked - without erosions - pessary replaced.  A/P:   ICD-10-CM   1. 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 Urinary frequency  R35.0   Cont Ditropan for OAB sx's, seems to have improved.  Monitor for side effects. Pessary was cleaned and replaced today. Instructions given for care. Concerning symptoms to observe for are counseled to patient. Follow up scheduled for 3 months.  A total of 20 minutes were spent face-to-face with the patient  as well as preparation, review, communication, and documentation during this encounter.   Barnett Applebaum, MD, [facility], [facility] 06/06/2019  10:18 AM

## 2019-06-19 ENCOUNTER — Encounter (INDEPENDENT_AMBULATORY_CARE_PROVIDER_SITE_OTHER): Payer: Medicare Other | Admitting: Vascular Surgery

## 2019-06-26 ENCOUNTER — Ambulatory Visit (INDEPENDENT_AMBULATORY_CARE_PROVIDER_SITE_OTHER): Payer: Medicare Other | Admitting: Vascular Surgery

## 2019-06-26 ENCOUNTER — Encounter (INDEPENDENT_AMBULATORY_CARE_PROVIDER_SITE_OTHER): Payer: Self-pay | Admitting: Vascular Surgery

## 2019-06-26 ENCOUNTER — Other Ambulatory Visit: Payer: Self-pay

## 2019-06-26 VITALS — BP 126/81 | HR 111 | Ht 65.0 in | Wt 152.0 lb

## 2019-06-26 DIAGNOSIS — N183 Chronic kidney disease, stage 3 unspecified: Secondary | ICD-10-CM | POA: Diagnosis not present

## 2019-06-26 DIAGNOSIS — R29898 Other symptoms and signs involving the musculoskeletal system: Secondary | ICD-10-CM | POA: Diagnosis not present

## 2019-06-26 DIAGNOSIS — I10 Essential (primary) hypertension: Secondary | ICD-10-CM

## 2019-06-26 NOTE — Patient Instructions (Signed)
Peripheral Vascular Disease  Peripheral vascular disease (PVD) is a disease of the blood vessels that are not part of your heart and brain. A simple term for PVD is poor circulation. In most cases, PVD narrows the blood vessels that carry blood from your heart to the rest of your body. This can reduce the supply of blood to your arms, legs, and internal organs, like your stomach or kidneys. However, PVD most often affects a person's lower legs and feet. Without treatment, PVD tends to get worse. PVD can also lead to acute ischemic limb. This is when an arm or leg suddenly cannot get enough blood. This is a medical emergency. Follow these instructions at home: Lifestyle  Do not use any products that contain nicotine or tobacco, such as cigarettes and e-cigarettes. If you need help quitting, ask your doctor.  Lose weight if you are overweight. Or, stay at a healthy weight as told by your doctor.  Eat a diet that is low in fat and cholesterol. If you need help, ask your doctor.  Exercise regularly. Ask your doctor for activities that are right for you. General instructions  Take over-the-counter and prescription medicines only as told by your doctor.  Take good care of your feet: ? Wear comfortable shoes that fit well. ? Check your feet often for any cuts or sores.  Keep all follow-up visits as told by your doctor This is important. Contact a doctor if:  You have cramps in your legs when you walk.  You have leg pain when you are at rest.  You have coldness in a leg or foot.  Your skin changes.  You are unable to get or have an erection (erectile dysfunction).  You have cuts or sores on your feet that do not heal. Get help right away if:  Your arm or leg turns cold, numb, and blue.  Your arms or legs become red, warm, swollen, painful, or numb.  You have chest pain.  You have trouble breathing.  You suddenly have weakness in your face, arm, or leg.  You become very  confused or you cannot speak.  You suddenly have a very bad headache.  You suddenly cannot see. Summary  Peripheral vascular disease (PVD) is a disease of the blood vessels.  A simple term for PVD is poor circulation. Without treatment, PVD tends to get worse.  Treatment may include exercise, low fat and low cholesterol diet, and quitting smoking. This information is not intended to replace advice given to you by your health care provider. Make sure you discuss any questions you have with your health care provider. Document Revised: 01/07/2017 Document Reviewed: 03/04/2016 Elsevier Patient Education  2020 [employer]  

## 2019-06-26 NOTE — Assessment & Plan Note (Signed)
blood pressure control important in reducing the progression of atherosclerotic disease. On appropriate oral medications.  

## 2019-06-26 NOTE — Assessment & Plan Note (Signed)
The source of this is not entirely clear.  I think it is certainly reasonable to assess her for vascular disease as a potential contributing cause.  I have discussed the pathophysiology and natural history of both venous disease and arterial disease.  Noninvasive studies will be performed in the near future at her convenience.  We will see her back following the studies to discuss the results and determine further treatment options.

## 2019-06-26 NOTE — Assessment & Plan Note (Signed)
Avoid contrast in the evaluation of her vascular disease unless noninvasive studies suggest severe disease that would require intervention.

## 2019-06-26 NOTE — Progress Notes (Signed)
Patient ID: Christy [name], female   DOB: 1940-09-29, 79 y.o.   MRN: [id number]  Chief Complaint  Patient presents with  . New Patient (Initial Visit)    LE pain    HPI Christy [name] is a 79 y.o. female.  I am asked to see the patient by Dr. 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  She is not diabetic, does not drink, but has had issues with her back in the past.  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  She has no previous history of DVT or superficial thrombophlebitis to her knowledge.  She does have some mild swelling but that is not a prominent symptom.  It is not overtly painful and she does not have typical claudication but she walks very unstably.  Both legs are affected   Past Medical History:  Diagnosis Date  . Allergy   . Anxiety   . Depression   . Fatty liver disease, nonalcoholic   . GERD (gastroesophageal reflux disease)   . Heart disease   . Hypertension   . Lung cancer ([place]) 02/2014   RUL Lobectomy  . Urinary incontinence     Past Surgical History:  Procedure Laterality Date  . ABDOMINAL HYSTERECTOMY    . KYPHOPLASTY N/A 05/04/2019   Procedure: KYPHOPLASTY T7;  Surgeon: Hessie Knows, MD;  Location: [facility] ORS;  Service: Orthopedics;  Laterality: N/A;  . [place]  . LARYNGOSCOPY N/A 07/22/2014   Procedure: JET LARYNGOSCOPY with right vocal cord biopsy;  Surgeon: Margaretha Sheffield, MD;  Location: [facility] ORS;  Service: ENT;  Laterality: N/A;  . LUNG CANCER SURGERY       Family History  Problem Relation Age of Onset  . Kidney cancer Cousin   . Cancer Maternal Grandfather   . Cancer Maternal Grandmother   . Kidney disease Neg Hx   . Prostate cancer Neg Hx      Social History   Tobacco Use  . Smoking status: Former Smoker    Packs/day: 0.50    Years: 25.00    Pack years: 12.50     Types: Cigarettes    Quit date: 07/18/2006    Years since quitting: 12.9  . Smokeless tobacco: Never Used  Substance Use Topics  . Alcohol use: Yes    Alcohol/week: 2.0 standard drinks    Types: 1 Glasses of wine, 1 Cans of beer per week    Comment: occassionally about 4 times a year  . Drug use: No     Allergies  Allergen Reactions  . Alendronate Other (See Comments)    Esophageal burning  . Sulfa Antibiotics Other (See Comments)  . Ciprofloxacin Other (See Comments)    Other Reaction: GI UPSET  . Diphenhydramine Other (See Comments)    Current Outpatient Medications  Medication Sig Dispense Refill  . benazepril (LOTENSIN) 5 MG tablet Take 1 tablet by mouth daily.    . calcium citrate-vitamin D 500-400 MG-UNIT chewable tablet Chew 1 tablet by mouth daily.    . DULoxetine (CYMBALTA) 20 MG capsule Take 1 capsule by mouth daily.    Marland Kitchen estradiol (ESTRACE) 1 MG tablet Take 1 mg by mouth daily.     . Multiple Vitamins-Minerals (OCUVITE ADULT 50+ PO) Take 1 tablet by mouth daily.    Marland Kitchen omeprazole (PRILOSEC) 40 MG capsule Take 40 mg by mouth daily.    Marland Kitchen  polyethylene glycol (MIRALAX / GLYCOLAX) 17 g packet Take 17 g by mouth 2 (two) times daily. 14 each 0  . vitamin B-12 (CYANOCOBALAMIN) 1000 MCG tablet Take 1,000 mcg by mouth daily.    Marland Kitchen HYDROcodone-acetaminophen (NORCO/VICODIN) 5-325 MG tablet Take 2 tablets by mouth every 6 (six) hours as needed for moderate pain. (Patient not taking: Reported on 06/26/2019) 20 tablet 0  . nortriptyline (PAMELOR) 10 MG capsule Take 20 mg by mouth at bedtime.     . senna-docusate (SENOKOT-S) 8.6-50 MG tablet Take 2 tablets by mouth daily as needed for mild constipation. (Patient not taking: Reported on 06/26/2019) 30 tablet 0  . traMADol (ULTRAM) 50 MG tablet Take 50 mg by mouth every 12 (twelve) hours as needed.     No current facility-administered medications for this visit.      REVIEW OF SYSTEMS (Negative unless checked)  Constitutional: [] Weight  loss  [] Fever  [] Chills Cardiac: [] Chest pain   [] Chest pressure   [] Palpitations   [] Shortness of breath when laying flat   [] Shortness of breath at rest   [] Shortness of breath with exertion. Vascular:  [] Pain in legs with walking   [] Pain in legs at rest   [] Pain in legs when laying flat   [] Claudication   [] Pain in feet when walking  [] Pain in feet at rest  [] Pain in feet when laying flat   [] History of DVT   [] Phlebitis   [x] Swelling in legs   [] Varicose veins   [] Non-healing ulcers Pulmonary:   [] Uses home oxygen   [] Productive cough   [] Hemoptysis   [] Wheeze  [] COPD   [] Asthma Neurologic:  [x] Dizziness  [] Blackouts   [] Seizures   [] History of stroke   [] History of TIA  [] Aphasia   [] Temporary blindness   [] Dysphagia   [] Weakness or numbness in arms   [] Weakness or numbness in legs Musculoskeletal:  [x] Arthritis   [] Joint swelling   [x] Joint pain   [] Low back pain Hematologic:  [] Easy bruising  [] Easy bleeding   [] Hypercoagulable state   [] Anemic  [] Hepatitis Gastrointestinal:  [] Blood in stool   [] Vomiting blood  [] Gastroesophageal reflux/heartburn   [] Abdominal pain Genitourinary:  [] Chronic kidney disease   [] Difficult urination  [] Frequent urination  [] Burning with urination   [] Hematuria Skin:  [] Rashes   [] Ulcers   [] Wounds Psychological:  [] History of anxiety   []  History of major depression.    Physical Exam BP 126/81   Pulse (!) 111   Ht 5\' 5"  (1.651 m)   Wt 152 lb (68.9 kg)   BMI 25.29 kg/m  Gen:  WD/WN, NAD.  Appears younger than stated age Head: [place]/AT, No temporalis wasting.  Ear/Nose/Throat: Hearing grossly intact, nares w/o erythema or drainage, oropharynx w/o Erythema/Exudate Eyes: Conjunctiva clear, sclera non-icteric  Neck: Wearing a cervical collar today Pulmonary:  Good air movement, respirations not labored, no use of accessory muscles  Cardiac: Tachycardic Vascular:  Vessel Right Left  Radial Palpable Palpable                          DP  1+  1+  PT   1+  1+   Gastrointestinal:. No masses, surgical incisions, or scars. Musculoskeletal: M/S 5/5 throughout.  Extremities without ischemic changes.  No deformity or atrophy.  Her gait is quite unsteady.  Trace bilateral lower extremity edema. Neurologic: Sensation grossly intact in extremities.  Symmetrical.  Speech is fluent. Motor exam as listed above. Psychiatric: Judgment intact, Mood & affect appropriate  for pt's clinical situation. Dermatologic: No rashes or ulcers noted.  No cellulitis or open wounds.    Radiology MR CERVICAL SPINE WO CONTRAST  Result Date: 06/07/2019 CLINICAL DATA:  [place] 05/03/2019 with cervical fracture. Right-sided pain. Tingling of the right arm. EXAM: MRI CERVICAL SPINE WITHOUT CONTRAST TECHNIQUE: Multiplanar, multisequence MR imaging of the cervical spine was performed. No intravenous contrast was administered. COMPARISON:  CT 05/03/2019 FINDINGS: Alignment: Slightly exaggerated upper cervical lordosis. No antero or retrolisthesis. Vertebrae: No vertebral body abnormality. Mild residual marrow edema associated with the nondisplaced spinous process fractures of C2 and C3. Fracture of the tip of the spinous process at C7 shows the fragment to have become displaced since the initial imaging by about 5 mm. Cord: No cord compression or primary cord lesion. No evidence of cord injury related to the previous event. Posterior Fossa, vertebral arteries, paraspinal tissues: Cerebellar atrophy. Chronic small-vessel changes of the pons. Disc levels: Foramen magnum is widely patent. No abnormality of significance at the C1-2 level. No disc pathology or stenosis at C2-3, C3-4 or C4-5. C5-6: Mild bulging of the disc.  No compressive stenosis. C6-7: Mild bulging of the disc.  No compressive stenosis. C7-T1: Normal disc space. IMPRESSION: Nondisplaced fractures of the spinous processes of C2 and C3 shown by previous CT are difficult to appreciate by MRI. There is only mild edema in those  regions in there is no evidence of development of displacement. Fracture of the tip of the spinous process of C7 is now displaced about 5 mm. No evidence of traumatic cord injury. No evidence of recent disc herniation. No compressive canal or foraminal stenosis. Mild non-compressive disc bulges C5-6 and C6-7. Electronically Signed   By: Nelson Chimes M.D.   On: 06/07/2019 09:01    Labs Recent Results (from the past 2160 hour(s))  CBC with Differential     Status: Abnormal   Collection Time: 05/03/19  9:08 PM  Result Value Ref Range   WBC 18.0 (H) 4.0 - 10.5 K/uL   RBC 4.25 3.87 - 5.11 MIL/uL   Hemoglobin 12.9 12.0 - 15.0 g/dL   HCT 39.8 36.0 - 46.0 %   MCV 93.6 80.0 - 100.0 fL   MCH 30.4 26.0 - 34.0 pg   MCHC 32.4 30.0 - 36.0 g/dL   RDW 12.8 11.5 - 15.5 %   Platelets 233 150 - 400 K/uL   nRBC 0.0 0.0 - 0.2 %   Neutrophils Relative % 87 %   Neutro Abs 15.6 (H) 1.7 - 7.7 K/uL   Lymphocytes Relative 5 %   Lymphs Abs 1.0 0.7 - 4.0 K/uL   Monocytes Relative 7 %   Monocytes Absolute 1.3 (H) 0.1 - 1.0 K/uL   Eosinophils Relative 0 %   Eosinophils Absolute 0.0 0.0 - 0.5 K/uL   Basophils Relative 0 %   Basophils Absolute 0.1 0.0 - 0.1 K/uL   Immature Granulocytes 1 %   Abs Immature Granulocytes 0.09 (H) 0.00 - 0.07 K/uL    Comment: Performed at [facility], [address] [place], [place] [address]  Basic metabolic panel     Status: Abnormal   Collection Time: 05/03/19  9:08 PM  Result Value Ref Range   Sodium 137 135 - 145 mmol/L   Potassium 3.8 3.5 - 5.1 mmol/L   Chloride 100 98 - 111 mmol/L   CO2 27 22 - 32 mmol/L   Glucose, Bld 139 (H) 70 - 99 mg/dL    Comment: Glucose reference range applies  only to samples taken after fasting for at least 8 hours.   BUN 24 (H) 8 - 23 mg/dL   Creatinine, Ser 0.73 0.44 - 1.00 mg/dL   Calcium 9.3 8.9 - 10.3 mg/dL   GFR calc non Af Amer >60 >60 mL/min   GFR calc Af Amer >60 >60 mL/min   Anion gap 10 5 - 15    Comment: Performed at  [facility], [place], [place] [address]  SARS CORONAVIRUS 2 (TAT 6-24 HRS) Nasopharyngeal Nasopharyngeal Swab     Status: None   Collection Time: 05/03/19  9:08 PM   Specimen: Nasopharyngeal Swab  Result Value Ref Range   SARS Coronavirus 2 NEGATIVE NEGATIVE    Comment: (NOTE) SARS-CoV-2 target nucleic acids are NOT DETECTED. The SARS-CoV-2 RNA is generally detectable in upper and lower respiratory specimens during the acute phase of infection. Negative results do not preclude SARS-CoV-2 infection, do not rule out co-infections with other pathogens, and should not be used as the sole basis for treatment or other patient management decisions. Negative results must be combined with clinical observations, patient history, and epidemiological information. The expected result is Negative. Fact Sheet for Patients: SugarRoll.be Fact Sheet for Healthcare Providers: https://www.woods-mathews.com/ This test is not yet approved or cleared by the [place] FDA and  has been authorized for detection and/or diagnosis of SARS-CoV-2 by FDA under an Emergency Use Authorization (EUA). This EUA will remain  in effect (meaning this test can be used) for the duration of the COVID-19 declaration under Section 56 4(b)(1) of the Act, 21 U.S.C. section 360bbb-3(b)(1), unless the authorization is terminated or revoked sooner. Performed at [facility], [place] [address] [place], [place] [address]   Comprehensive metabolic panel     Status: Abnormal   Collection Time: 05/04/19  6:10 AM  Result Value Ref Range   Sodium 138 135 - 145 mmol/L   Potassium 3.9 3.5 - 5.1 mmol/L   Chloride 103 98 - 111 mmol/L   CO2 27 22 - 32 mmol/L   Glucose, Bld 111 (H) 70 - 99 mg/dL    Comment: Glucose reference range applies only to samples taken after fasting for at least 8 hours.   BUN 19 8 - 23 mg/dL   Creatinine, Ser 0.68 0.44 - 1.00 mg/dL    Calcium 9.0 8.9 - 10.3 mg/dL   Total Protein 6.9 6.5 - 8.1 g/dL   Albumin 3.5 3.5 - 5.0 g/dL   AST 22 15 - 41 U/L   ALT 16 0 - 44 U/L   Alkaline Phosphatase 65 38 - 126 U/L   Total Bilirubin 0.9 0.3 - 1.2 mg/dL   GFR calc non Af Amer >60 >60 mL/min   GFR calc Af Amer >60 >60 mL/min   Anion gap 8 5 - 15    Comment: Performed at [facility], [address] [place], [place] [address]  CBC     Status: Abnormal   Collection Time: 05/04/19  6:10 AM  Result Value Ref Range   WBC 14.4 (H) 4.0 - 10.5 K/uL   RBC 4.16 3.87 - 5.11 MIL/uL   Hemoglobin 12.9 12.0 - 15.0 g/dL   HCT 38.7 36.0 - 46.0 %   MCV 93.0 80.0 - 100.0 fL   MCH 31.0 26.0 - 34.0 pg   MCHC 33.3 30.0 - 36.0 g/dL   RDW 12.6 11.5 - 15.5 %   Platelets 237 150 - 400 K/uL   nRBC 0.0 0.0 -  0.2 %    Comment: Performed at [facility], [address] [place], [place] [address]  Surgical pathology     Status: None   Collection Time: 05/04/19  9:59 AM  Result Value Ref Range   SURGICAL PATHOLOGY      SURGICAL PATHOLOGY CASE: [id number] PATIENT: Heath Lark Surgical Pathology Report     Specimen Submitted: A. T7 bone; bx  Clinical History: Vertebral fracture T7     DIAGNOSIS: A. BONE, T7 VERTEBRA; BIOPSY: - TRABECULAR BONE WITH TRILINEAGE HEMATOPOIESIS. - NEGATIVE FOR MALIGNANCY.  GROSS DESCRIPTION: A. Labeled: T7 bone biopsy Received: Formalin Tissue fragment(s): 1 Size: 0.5 x 0.2 x 0.2 cm Description: Received is an irregular fragment of tan-red, bony soft tissue. Entirely submitted in cassette 1 (following decalcification).    Final Diagnosis performed by Bryan Lemma, MD.   Electronically signed 05/07/2019 7:31:57PM The electronic signature indicates that the named Attending Pathologist has evaluated the specimen Technical component performed at [facility], [address], [place], [place] [address] Lab: [id number] Dir: Rush Farmer, MD, MMM  Professional component performed at  [facility], [address] [place], [place], [place] [address] Lab: [phone] Dir: Dellia Nims. Rubinas, MD   SARS CORONAVIRUS 2 (TAT 6-24 HRS) Nasopharyngeal Nasopharyngeal Swab     Status: None   Collection Time: 05/07/19 10:57 AM   Specimen: Nasopharyngeal Swab  Result Value Ref Range   SARS Coronavirus 2 NEGATIVE NEGATIVE    Comment: (NOTE) SARS-CoV-2 target nucleic acids are NOT DETECTED. The SARS-CoV-2 RNA is generally detectable in upper and lower respiratory specimens during the acute phase of infection. Negative results do not preclude SARS-CoV-2 infection, do not rule out co-infections with other pathogens, and should not be used as the sole basis for treatment or other patient management decisions. Negative results must be combined with clinical observations, patient history, and epidemiological information. The expected result is Negative. Fact Sheet for Patients: SugarRoll.be Fact Sheet for Healthcare Providers: https://www.woods-mathews.com/ This test is not yet approved or cleared by the [place] FDA and  has been authorized for detection and/or diagnosis of SARS-CoV-2 by FDA under an Emergency Use Authorization (EUA). This EUA will remain  in effect (meaning this test can be used) for the duration of the COVID-19 declaration under Section 56 4(b)(1) of the Act, 21 U.S.C. section 360bbb-3(b)(1), unless the authorization is terminated or revoked sooner. Performed at [facility], [place] [address] [place], [place] [address]   Basic metabolic panel     Status: Abnormal   Collection Time: 05/22/19  1:32 PM  Result Value Ref Range   Sodium 136 135 - 145 mmol/L   Potassium 4.1 3.5 - 5.1 mmol/L   Chloride 100 98 - 111 mmol/L   CO2 25 22 - 32 mmol/L   Glucose, Bld 148 (H) 70 - 99 mg/dL    Comment: Glucose reference range applies only to samples taken after fasting for at least 8 hours.   BUN 33  (H) 8 - 23 mg/dL   Creatinine, Ser 1.23 (H) 0.44 - 1.00 mg/dL   Calcium 9.6 8.9 - 10.3 mg/dL   GFR calc non Af Amer 42 (L) >60 mL/min   GFR calc Af Amer 48 (L) >60 mL/min   Anion gap 11 5 - 15    Comment: Performed at [facility], [address], [place],  [address]  CBC     Status: Abnormal   Collection Time: 05/22/19  1:32 PM  Result Value Ref Range  WBC 13.3 (H) 4.0 - 10.5 K/uL   RBC 4.80 3.87 - 5.11 MIL/uL   Hemoglobin 15.1 (H) 12.0 - 15.0 g/dL   HCT 45.0 36.0 - 46.0 %   MCV 93.8 80.0 - 100.0 fL   MCH 31.5 26.0 - 34.0 pg   MCHC 33.6 30.0 - 36.0 g/dL   RDW 12.7 11.5 - 15.5 %   Platelets 317 150 - 400 K/uL   nRBC 0.0 0.0 - 0.2 %    Comment: Performed at [facility], [address] [place], [place] [address]  Protime-INR     Status: None   Collection Time: 05/22/19  1:32 PM  Result Value Ref Range   Prothrombin Time 12.9 11.4 - 15.2 seconds   INR 1.0 0.8 - 1.2    Comment: (NOTE) INR goal varies based on device and disease states. Performed at [facility], [place], [place] [address]   Troponin I (High Sensitivity)     Status: None   Collection Time: 05/22/19  1:32 PM  Result Value Ref Range   Troponin I (High Sensitivity) 7 <18 ng/L    Comment: (NOTE) Elevated high sensitivity troponin I (hsTnI) values and significant  changes across serial measurements may suggest ACS but many other  chronic and acute conditions are known to elevate hsTnI results.  Refer to the "Links" section for chest pain algorithms and additional  guidance. Performed at [facility], [address] [place], [place] [address]   Lactic acid, plasma     Status: None   Collection Time: 05/22/19  1:34 PM  Result Value Ref Range   Lactic Acid, Venous 1.6 0.5 - 1.9 mmol/L    Comment: Performed at [facility], [address] [place], [place] [address]  Culture, blood (Routine x 2)     Status: None   Collection Time:  05/22/19  1:38 PM   Specimen: BLOOD RIGHT ARM  Result Value Ref Range   Specimen Description BLOOD RIGHT ARM    Special Requests      BOTTLES DRAWN AEROBIC AND ANAEROBIC Blood Culture adequate volume   Culture      NO GROWTH 5 DAYS Performed at [facility], [address] [place], [place] [address]    Report Status 05/27/2019 FINAL   Lactic acid, plasma     Status: None   Collection Time: 05/22/19  5:16 PM  Result Value Ref Range   Lactic Acid, Venous 0.8 0.5 - 1.9 mmol/L    Comment: Performed at [facility], [place], [place] [address]  Troponin I (High Sensitivity)     Status: None   Collection Time: 05/22/19  5:16 PM  Result Value Ref Range   Troponin I (High Sensitivity) 7 <18 ng/L    Comment: (NOTE) Elevated high sensitivity troponin I (hsTnI) values and significant  changes across serial measurements may suggest ACS but many other  chronic and acute conditions are known to elevate hsTnI results.  Refer to the "Links" section for chest pain algorithms and additional  guidance. Performed at [facility], [address] [place], [place] [address]   Urinalysis, Complete w Microscopic     Status: Abnormal   Collection Time: 05/22/19  6:44 PM  Result Value Ref Range   Color, Urine YELLOW (A) YELLOW   APPearance HAZY (A) CLEAR   Specific Gravity, Urine 1.010 1.005 - 1.030   pH 8.0 5.0 - 8.0   Glucose, UA NEGATIVE NEGATIVE mg/dL  Hgb urine dipstick NEGATIVE NEGATIVE   Bilirubin Urine NEGATIVE NEGATIVE   Ketones, ur NEGATIVE NEGATIVE mg/dL   Protein, ur NEGATIVE NEGATIVE mg/dL   Nitrite POSITIVE (A) NEGATIVE   Leukocytes,Ua LARGE (A) NEGATIVE   RBC / HPF 0-5 0 - 5 RBC/hpf   WBC, UA >50 (H) 0 - 5 WBC/hpf   Bacteria, UA RARE (A) NONE SEEN   Squamous Epithelial / LPF 0-5 0 - 5   Mucus PRESENT     Comment: Performed at [facility], [address] [place], [place] [address]  Urine culture     Status: Abnormal    Collection Time: 05/22/19  6:44 PM   Specimen: Urine, Random  Result Value Ref Range   Specimen Description      URINE, RANDOM Performed at [facility], [address] [place], [place] [address]    Special Requests      NONE Performed at [facility], [address], [place], [place] [address]    Culture >=100,000 COLONIES/mL PROTEUS MIRABILIS (A)    Report Status 05/25/2019 FINAL    Organism ID, Bacteria PROTEUS MIRABILIS (A)       Susceptibility   Proteus mirabilis - MIC*    AMPICILLIN <=2 SENSITIVE Sensitive     CEFAZOLIN <=4 SENSITIVE Sensitive     CEFTRIAXONE <=0.25 SENSITIVE Sensitive     CIPROFLOXACIN <=0.25 SENSITIVE Sensitive     GENTAMICIN <=1 SENSITIVE Sensitive     IMIPENEM 2 SENSITIVE Sensitive     NITROFURANTOIN 128 RESISTANT Resistant     TRIMETH/SULFA <=20 SENSITIVE Sensitive     AMPICILLIN/SULBACTAM <=2 SENSITIVE Sensitive     PIP/TAZO <=4 SENSITIVE Sensitive     * >=100,000 COLONIES/mL PROTEUS MIRABILIS  Basic metabolic panel     Status: Abnormal   Collection Time: 05/22/19  7:10 PM  Result Value Ref Range   Sodium 138 135 - 145 mmol/L   Potassium 4.6 3.5 - 5.1 mmol/L   Chloride 106 98 - 111 mmol/L   CO2 25 22 - 32 mmol/L   Glucose, Bld 117 (H) 70 - 99 mg/dL    Comment: Glucose reference range applies only to samples taken after fasting for at least 8 hours.   BUN 29 (H) 8 - 23 mg/dL   Creatinine, Ser 0.92 0.44 - 1.00 mg/dL   Calcium 8.7 (L) 8.9 - 10.3 mg/dL   GFR calc non Af Amer 59 (L) >60 mL/min   GFR calc Af Amer >60 >60 mL/min   Anion gap 7 5 - 15    Comment: Performed at [facility], [place], [place] [address]  SARS CORONAVIRUS 2 (TAT 6-24 HRS) Nasopharyngeal Nasopharyngeal Swab     Status: None   Collection Time: 05/22/19  8:23 PM   Specimen: Nasopharyngeal Swab  Result Value Ref Range   SARS Coronavirus 2 NEGATIVE NEGATIVE    Comment: (NOTE) SARS-CoV-2 target nucleic acids are NOT  DETECTED. The SARS-CoV-2 RNA is generally detectable in upper and lower respiratory specimens during the acute phase of infection. Negative results do not preclude SARS-CoV-2 infection, do not rule out co-infections with other pathogens, and should not be used as the sole basis for treatment or other patient management decisions. Negative results must be combined with clinical observations, patient history, and epidemiological information. The expected result is Negative. Fact Sheet for Patients: SugarRoll.be Fact Sheet for Healthcare Providers: https://www.woods-mathews.com/ This test is not yet approved or cleared by the [place] FDA and  has been  authorized for detection and/or diagnosis of SARS-CoV-2 by FDA under an Emergency Use Authorization (EUA). This EUA will remain  in effect (meaning this test can be used) for the duration of the COVID-19 declaration under Section 56 4(b)(1) of the Act, 21 U.S.C. section 360bbb-3(b)(1), unless the authorization is terminated or revoked sooner. Performed at [facility], [place] [address] [place], [place] [address]   Basic metabolic panel     Status: Abnormal   Collection Time: 05/23/19  5:51 AM  Result Value Ref Range   Sodium 138 135 - 145 mmol/L   Potassium 4.2 3.5 - 5.1 mmol/L   Chloride 107 98 - 111 mmol/L   CO2 25 22 - 32 mmol/L   Glucose, Bld 104 (H) 70 - 99 mg/dL    Comment: Glucose reference range applies only to samples taken after fasting for at least 8 hours.   BUN 25 (H) 8 - 23 mg/dL   Creatinine, Ser 0.89 0.44 - 1.00 mg/dL   Calcium 8.0 (L) 8.9 - 10.3 mg/dL   GFR calc non Af Amer >60 >60 mL/min   GFR calc Af Amer >60 >60 mL/min   Anion gap 6 5 - 15    Comment: Performed at [facility], [address] [place], [place] [address]  CBC     Status: Abnormal   Collection Time: 05/23/19  5:51 AM  Result Value Ref Range   WBC 9.6 4.0 - 10.5 K/uL   RBC 3.71 (L) 3.87  - 5.11 MIL/uL   Hemoglobin 11.5 (L) 12.0 - 15.0 g/dL   HCT 34.6 (L) 36.0 - 46.0 %   MCV 93.3 80.0 - 100.0 fL   MCH 31.0 26.0 - 34.0 pg   MCHC 33.2 30.0 - 36.0 g/dL   RDW 12.8 11.5 - 15.5 %   Platelets 218 150 - 400 K/uL   nRBC 0.0 0.0 - 0.2 %    Comment: Performed at [facility], [address] [place], [place] [address]  Basic metabolic panel     Status: Abnormal   Collection Time: 05/24/19  5:22 AM  Result Value Ref Range   Sodium 139 135 - 145 mmol/L   Potassium 3.8 3.5 - 5.1 mmol/L   Chloride 110 98 - 111 mmol/L   CO2 25 22 - 32 mmol/L   Glucose, Bld 98 70 - 99 mg/dL    Comment: Glucose reference range applies only to samples taken after fasting for at least 8 hours.   BUN 12 8 - 23 mg/dL   Creatinine, Ser 0.77 0.44 - 1.00 mg/dL   Calcium 8.1 (L) 8.9 - 10.3 mg/dL   GFR calc non Af Amer >60 >60 mL/min   GFR calc Af Amer >60 >60 mL/min   Anion gap 4 (L) 5 - 15    Comment: Performed at [facility], [address] [place], [place] [address]  CBC     Status: Abnormal   Collection Time: 05/24/19  5:22 AM  Result Value Ref Range   WBC 7.7 4.0 - 10.5 K/uL   RBC 3.65 (L) 3.87 - 5.11 MIL/uL   Hemoglobin 11.3 (L) 12.0 - 15.0 g/dL   HCT 34.7 (L) 36.0 - 46.0 %   MCV 95.1 80.0 - 100.0 fL   MCH 31.0 26.0 - 34.0 pg   MCHC 32.6 30.0 - 36.0 g/dL   RDW 12.4 11.5 - 15.5 %   Platelets 219 150 - 400 K/uL   nRBC 0.0 0.0 - 0.2 %  Comment: Performed at [facility], [address] [place], [place] [address]  Magnesium     Status: None   Collection Time: 05/24/19  5:22 AM  Result Value Ref Range   Magnesium 2.0 1.7 - 2.4 mg/dL    Comment: Performed at [facility], [address] [place], [place] [address]  Basic metabolic panel     Status: Abnormal   Collection Time: 05/25/19  5:35 AM  Result Value Ref Range   Sodium 139 135 - 145 mmol/L   Potassium 3.9 3.5 - 5.1 mmol/L   Chloride 107 98 - 111 mmol/L   CO2 24 22 - 32 mmol/L   Glucose, Bld  95 70 - 99 mg/dL    Comment: Glucose reference range applies only to samples taken after fasting for at least 8 hours.   BUN 10 8 - 23 mg/dL   Creatinine, Ser 0.51 0.44 - 1.00 mg/dL   Calcium 8.0 (L) 8.9 - 10.3 mg/dL   GFR calc non Af Amer >60 >60 mL/min   GFR calc Af Amer >60 >60 mL/min   Anion gap 8 5 - 15    Comment: Performed at [facility], [address] [place], [place] [address]  CBC     Status: None   Collection Time: 05/25/19  5:35 AM  Result Value Ref Range   WBC 9.4 4.0 - 10.5 K/uL   RBC 3.91 3.87 - 5.11 MIL/uL   Hemoglobin 12.0 12.0 - 15.0 g/dL   HCT 36.0 36.0 - 46.0 %   MCV 92.1 80.0 - 100.0 fL   MCH 30.7 26.0 - 34.0 pg   MCHC 33.3 30.0 - 36.0 g/dL   RDW 12.4 11.5 - 15.5 %   Platelets 225 150 - 400 K/uL   nRBC 0.0 0.0 - 0.2 %    Comment: Performed at [facility], [address], [place], [place] [address]  Magnesium     Status: None   Collection Time: 05/25/19  5:35 AM  Result Value Ref Range   Magnesium 2.1 1.7 - 2.4 mg/dL    Comment: Performed at [facility], [address] [place], [place] [address]    Assessment/Plan:  Benign essential HTN blood pressure control important in reducing the progression of atherosclerotic disease. On appropriate oral medications.   Weakness of both legs The source of this is not entirely clear.  I think it is certainly reasonable to assess her for vascular disease as a potential contributing cause.  I have discussed the pathophysiology and natural history of both venous disease and arterial disease.  Noninvasive studies will be performed in the near future at her convenience.  We will see her back following the studies to discuss the results and determine further treatment options.  Chronic kidney disease (CKD), stage III (moderate) Avoid contrast in the evaluation of her vascular disease unless noninvasive studies suggest severe disease that would require intervention.      Leotis Pain 06/26/2019, 4:39 PM   This note was created with Dragon medical transcription system.  Any errors from dictation are unintentional.

## 2019-07-13 ENCOUNTER — Encounter (INDEPENDENT_AMBULATORY_CARE_PROVIDER_SITE_OTHER): Payer: Medicare Other

## 2019-07-13 ENCOUNTER — Ambulatory Visit
Admission: EM | Admit: 2019-07-13 | Discharge: 2019-07-13 | Disposition: A | Discharge status: Home / Self Care | Payer: Medicare Other | Attending: Family Medicine | Admitting: Family Medicine

## 2019-07-13 ENCOUNTER — Other Ambulatory Visit: Payer: Self-pay

## 2019-07-13 ENCOUNTER — Encounter: Payer: Self-pay | Admitting: Emergency Medicine

## 2019-07-13 ENCOUNTER — Ambulatory Visit (INDEPENDENT_AMBULATORY_CARE_PROVIDER_SITE_OTHER): Payer: Medicare Other | Admitting: Nurse Practitioner

## 2019-07-13 DIAGNOSIS — K5900 Constipation, unspecified: Secondary | ICD-10-CM

## 2019-07-13 DIAGNOSIS — R110 Nausea: Secondary | ICD-10-CM

## 2019-07-13 MED ORDER — BISACODYL 10 MG RE SUPP: 10.0000 mg | RECTAL | 0 refills | Status: DC | PRN

## 2019-07-13 NOTE — ED Provider Notes (Signed)
[facility]    CSN: [id number] Arrival date & time: 07/13/19  1438      History   Chief Complaint Chief Complaint  Patient presents with  . Nausea    HPI Christy [name] is a 79 y.o. female.   79 yo female with a c/o nausea and constipation for the past 2 weeks. States she finally had a bowel movement last night and felt better but still having nausea. Denies any vomiting, fevers, chills, abdominal pain, melena, hematochezia. States she has zofran prescribed by her PCP.      Past Medical History:  Diagnosis Date  . Allergy   . Anxiety   . Depression   . Fatty liver disease, nonalcoholic   . GERD (gastroesophageal reflux disease)   . Heart disease   . Hypertension   . Lung cancer ([place]) 02/2014   RUL Lobectomy  . Urinary incontinence     Patient Active Problem List   Diagnosis Date Noted  . Sepsis ([place]) 05/22/2019  . Sepsis secondary to UTI ([place]) 05/22/2019  . Hypotension 05/22/2019  . Acute kidney injury superimposed on CKD ([place]) 05/22/2019  . Compression fracture of body of thoracic vertebra ([place]) 05/04/2019  . Compression fracture of C-spine ([place]) 05/04/2019  . Fall 05/03/2019  . Anxiety, generalized 03/19/2019  . Exertional shortness of breath 10/03/2018  . Heart palpitations 10/02/2018  . Mild cognitive impairment 03/28/2018  . Aortic atherosclerosis ([place]) 02/24/2018  . 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 Cystocele, midline 04/28/2017  . Recurrent major depressive disorder, in partial remission ([place]) 03/17/2017  . Neuropathy 03/01/2017  . Lumbosacral radiculopathy 02/16/2017  . Bilateral pain of leg and foot 12/16/2016  . Fatigue 12/16/2016  . Trochanteric bursitis of both hips 12/16/2016  . Weakness of both legs 12/16/2016  . Stress incontinence of urine 10/15/2014  . Polypharmacy 09/02/2014  . Primary cancer of right upper lobe of lung ([place]) 07/19/2014  . Acid reflux 07/18/2014  . 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 Osteoporosis,  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  . Chronic kidney disease (CKD), stage III (moderate) 08/30/2013  . Benign essential HTN 08/30/2013  . Fatty liver disease, nonalcoholic 56/43/3295  . Fibrositis 08/30/2013  . Hot flash, menopausal 08/30/2013  . Billowing mitral valve 08/30/2013  . Allergic rhinitis, seasonal 08/30/2013  . Fibromyalgia 08/30/2013    Past Surgical History:  Procedure Laterality Date  . ABDOMINAL HYSTERECTOMY    . KYPHOPLASTY N/A 05/04/2019   Procedure: KYPHOPLASTY T7;  Surgeon: Hessie Knows, MD;  Location: [facility] ORS;  Service: Orthopedics;  Laterality: N/A;  . [place]  . LARYNGOSCOPY N/A 07/22/2014   Procedure: JET LARYNGOSCOPY with right vocal cord biopsy;  Surgeon: Margaretha Sheffield, MD;  Location: [facility] ORS;  Service: ENT;  Laterality: N/A;  . LUNG CANCER SURGERY    . NECK SURGERY      OB History   No obstetric history on file.      Home Medications    Prior to Admission medications   Medication Sig Start Date End Date Taking? Authorizing Provider  benazepril (LOTENSIN) 5 MG tablet Take 1 tablet by mouth daily. 03/04/14  Yes [provider]  calcium citrate-vitamin D 500-400 MG-UNIT chewable tablet Chew 1 tablet by mouth daily.   Yes [provider]  DULoxetine (CYMBALTA) 20 MG capsule Take 1 capsule by mouth daily. 10/09/18  Yes [provider]  estradiol (ESTRACE) 1 MG tablet Take 1 mg by mouth daily.  04/29/17  Yes [provider]  Multiple Vitamins-Minerals (OCUVITE  ADULT 50+ PO) Take 1 tablet by mouth daily.   Yes [provider]  nortriptyline (PAMELOR) 10 MG capsule Take 20 mg by mouth at bedtime.  03/21/19  Yes [provider]  vitamin B-12 (CYANOCOBALAMIN) 1000 MCG tablet Take 1,000 mcg by mouth daily.   Yes [provider]  bisacodyl (DULCOLAX) 10 MG suppository Place 1 suppository (10 mg total) rectally as needed for moderate constipation. 07/13/19   Norval Gable, MD   HYDROcodone-acetaminophen (NORCO/VICODIN) 5-325 MG tablet Take 2 tablets by mouth every 6 (six) hours as needed for moderate pain. Patient not taking: Reported on 06/26/2019 05/07/19   Dhungel, Flonnie Overman, MD  omeprazole (PRILOSEC) 40 MG capsule Take 40 mg by mouth daily.    [provider]  polyethylene glycol (MIRALAX / GLYCOLAX) 17 g packet Take 17 g by mouth 2 (two) times daily. 05/07/19   Dhungel, Nishant, MD  senna-docusate (SENOKOT-S) 8.6-50 MG tablet Take 2 tablets by mouth daily as needed for mild constipation. Patient not taking: Reported on 06/26/2019 05/07/19 05/06/20  Dhungel, Flonnie Overman, MD  traMADol (ULTRAM) 50 MG tablet Take 50 mg by mouth every 12 (twelve) hours as needed.    [provider]    Family History Family History  Problem Relation Age of Onset  . Kidney cancer Cousin   . Cancer Maternal Grandfather   . Cancer Maternal Grandmother   . Kidney disease Neg Hx   . Prostate cancer Neg Hx     Social History Social History   Tobacco Use  . Smoking status: Former Smoker    Packs/day: 0.50    Years: 25.00    Pack years: 12.50    Types: Cigarettes    Quit date: 07/18/2006    Years since quitting: 12.9  . Smokeless tobacco: Never Used  Substance Use Topics  . Alcohol use: Yes    Alcohol/week: 2.0 standard drinks    Types: 1 Glasses of wine, 1 Cans of beer per week    Comment: occassionally about 4 times a year  . Drug use: No     Allergies   Alendronate, Sulfa antibiotics, Ciprofloxacin, and Diphenhydramine   Review of Systems Review of Systems   Physical Exam Triage Vital Signs ED Triage Vitals  Enc Vitals Group     BP 07/13/19 1458 125/69     Pulse Rate 07/13/19 1458 86     Resp 07/13/19 1458 14     Temp 07/13/19 1458 98.2 F (36.8 C)     Temp Source 07/13/19 1458 Oral     SpO2 07/13/19 1458 97 %     Weight 07/13/19 1454 152 lb (68.9 kg)     Height 07/13/19 1454 5' 5.5" (1.664 m)     Head Circumference --      Peak Flow --       Pain Score 07/13/19 1454 0     Pain Loc --      Pain Edu? --      Excl. in [place]? --    No data found.  Updated Vital Signs BP 125/69 (BP Location: Left Arm)   Pulse 86   Temp 98.2 F (36.8 C) (Oral)   Resp 14   Ht 5' 5.5" (1.664 m)   Wt 68.9 kg   SpO2 97%   BMI 24.91 kg/m   Visual Acuity Right Eye Distance:   Left Eye Distance:   Bilateral Distance:    Right Eye Near:   Left Eye Near:    Bilateral Near:  Physical Exam Vitals and nursing note reviewed.  Constitutional:      General: She is not in acute distress.    Appearance: She is not toxic-appearing or diaphoretic.  Cardiovascular:     Rate and Rhythm: Normal rate.  Pulmonary:     Effort: Pulmonary effort is normal. No respiratory distress.     Breath sounds: Normal breath sounds.  Abdominal:     General: Bowel sounds are normal. There is no distension.     Palpations: Abdomen is soft. There is no mass.     Tenderness: There is no abdominal tenderness. There is no right CVA tenderness, left CVA tenderness, guarding or rebound.     Hernia: No hernia is present.  Neurological:     Mental Status: She is alert.      UC Treatments / Results  Labs (all labs ordered are listed, but only abnormal results are displayed) Labs Reviewed - No data to display  EKG   Radiology No results found.  Procedures Procedures (including critical care time)  Medications Ordered in UC Medications - No data to display  Initial Impression / Assessment and Plan / UC Course  I have reviewed the triage vital signs and the nursing notes.  Pertinent labs & imaging results that were available during my care of the patient were reviewed by me and considered in my medical decision making (see chart for details).      Final Clinical Impressions(s) / UC Diagnoses   Final diagnoses:  Nausea  Constipation, unspecified constipation type     Discharge Instructions     Increase water intake, prune juice Follow up with  gastroenterologist    ED Prescriptions    Medication Sig Dispense Auth. Provider   bisacodyl (DULCOLAX) 10 MG suppository Place 1 suppository (10 mg total) rectally as needed for moderate constipation. 6 suppository Kaysha Parsell, MD      1. diagnosis reviewed with patient 2. rx as per orders above; reviewed possible side effects, interactions, risks and benefits  3. Recommend supportive treatment as above 4. Follow-up prn   PDMP not reviewed this encounter.   Norval Gable, MD 07/13/19 [phone]

## 2019-07-13 NOTE — ED Triage Notes (Signed)
Patient c/o nausea for the past 2 weeks.  Patient states she has seen 2 providers for her nausea.  Patient states that she had neck surgery couple of months ago.

## 2019-07-13 NOTE — Discharge Instructions (Signed)
Increase water intake, prune juice Follow up with gastroenterologist

## 2019-07-24 DIAGNOSIS — R270 Ataxia, unspecified: Secondary | ICD-10-CM | POA: Insufficient documentation

## 2019-07-24 DIAGNOSIS — R410 Disorientation, unspecified: Secondary | ICD-10-CM | POA: Insufficient documentation

## 2019-08-06 ENCOUNTER — Encounter (INDEPENDENT_AMBULATORY_CARE_PROVIDER_SITE_OTHER): Payer: Medicare Other

## 2019-08-21 ENCOUNTER — Ambulatory Visit (INDEPENDENT_AMBULATORY_CARE_PROVIDER_SITE_OTHER): Payer: Medicare Other

## 2019-08-21 ENCOUNTER — Ambulatory Visit (INDEPENDENT_AMBULATORY_CARE_PROVIDER_SITE_OTHER): Payer: Medicare Other | Admitting: Nurse Practitioner

## 2019-08-21 ENCOUNTER — Other Ambulatory Visit: Payer: Self-pay

## 2019-08-21 ENCOUNTER — Encounter (INDEPENDENT_AMBULATORY_CARE_PROVIDER_SITE_OTHER): Payer: Self-pay | Admitting: Vascular Surgery

## 2019-08-21 VITALS — BP 148/77 | HR 79 | Ht 65.0 in | Wt 153.0 lb

## 2019-08-21 DIAGNOSIS — R29898 Other symptoms and signs involving the musculoskeletal system: Secondary | ICD-10-CM

## 2019-08-21 DIAGNOSIS — M5417 Radiculopathy, lumbosacral region: Secondary | ICD-10-CM | POA: Diagnosis not present

## 2019-08-21 DIAGNOSIS — I10 Essential (primary) hypertension: Secondary | ICD-10-CM

## 2019-08-21 DIAGNOSIS — I700 Atherosclerosis of aorta: Secondary | ICD-10-CM

## 2019-08-21 NOTE — Progress Notes (Signed)
Subjective:    Patient ID: Christy [name], female    DOB: [birth year], 79 y.o.   MRN: [id number] Chief Complaint  Patient presents with  . Follow-up    U/S  follow up    Christy [name] is a 79 y.o. female.  The patient turns to the office for noninvasive studies related to lower extremity weakness, balance disorders and neuropathy of an unknown origin.  The patient does endorse having significant issues with her back.  The patient notes that when she is walking she felt as if her legs are going to give out.  She denies any claudication-like symptoms.  This is mostly consistent with walking and happens almost every day.  The patient endorses mild swelling but it is not lifestyle limiting or concerning for her.  Both legs seem to be affected equally.  She denies any fever, chills, nausea, vomiting or diarrhea.  Today noninvasive studies show bilateral ABIs of 1.16.  She has a TBI of 1.06 on the right and 1.00 on the left.  The patient has strong triphasic tibial artery waveforms bilaterally with strong toe waveforms bilaterally.  The patient also underwent a bilateral lower extremity venous reflux study which reveals no evidence of DVT or superficial venous thrombosis seen bilaterally.  No evidence of deep venous insufficiency seen bilaterally.  No evidence of superficial venous reflux seen in the great or short saphenous veins bilaterally.     Review of Systems  Musculoskeletal: Positive for arthralgias, back pain and gait problem.  Neurological: Positive for weakness.  All other systems reviewed and are negative.      Objective:   Physical Exam Vitals reviewed.  HENT:     Head: Normocephalic.  Cardiovascular:     Rate and Rhythm: Normal rate.     Pulses: Normal pulses.  Pulmonary:     Effort: Pulmonary effort is normal.  Skin:    General: Skin is warm and dry.  Neurological:     Mental Status: She is alert and oriented to person, place, and time.     Motor:  Weakness present.     Gait: Gait abnormal.  Psychiatric:        Mood and Affect: Mood normal.        Behavior: Behavior normal.        Judgment: Judgment normal.     BP (!) 148/77   Pulse 79   Ht 5\' 5"  (1.651 m)   Wt 153 lb (69.4 kg)   BMI 25.46 kg/m   Past Medical History:  Diagnosis Date  . Allergy   . Anxiety   . Depression   . Fatty liver disease, nonalcoholic   . GERD (gastroesophageal reflux disease)   . Heart disease   . Hypertension   . Lung cancer ([place]) 02/2014   RUL Lobectomy  . Urinary incontinence     Social History   Socioeconomic History  . Marital status: Married    Spouse name: Not on file  . Number of children: Not on file  . Years of education: Not on file  . Highest education level: Not on file  Occupational History  . Not on file  Tobacco Use  . Smoking status: Former Smoker    Packs/day: 0.50    Years: 25.00    Pack years: 12.50    Types: Cigarettes    Quit date: 07/18/2006    Years since quitting: 13.1  . Smokeless tobacco: Never Used  Vaping Use  . Vaping Use: Never used  Substance and Sexual Activity  . Alcohol use: Yes    Alcohol/week: 2.0 standard drinks    Types: 1 Glasses of wine, 1 Cans of beer per week    Comment: occassionally about 4 times a year  . Drug use: No  . Sexual activity: Not Currently    Birth control/protection: Surgical  Other Topics Concern  . Not on file  Social History Narrative  . Not on file   Social Determinants of Health   Financial Resource Strain:   . Difficulty of Paying Living Expenses:   Food Insecurity:   . Worried About Charity fundraiser in the Last Year:   . Arboriculturist in the Last Year:   Transportation Needs:   . Film/video editor (Medical):   Marland Kitchen Lack of Transportation (Non-Medical):   Physical Activity:   . Days of Exercise per Week:   . Minutes of Exercise per Session:   Stress:   . Feeling of Stress :   Social Connections:   . Frequency of Communication with Friends  and Family:   . Frequency of Social Gatherings with Friends and Family:   . Attends Religious Services:   . Active Member of Clubs or Organizations:   . Attends Archivist Meetings:   Marland Kitchen Marital Status:   Intimate Partner Violence:   . Fear of Current or Ex-Partner:   . Emotionally Abused:   [place] Kitchen Physically Abused:   . Sexually Abused:     Past Surgical History:  Procedure Laterality Date  . ABDOMINAL HYSTERECTOMY    . KYPHOPLASTY N/A 05/04/2019   Procedure: KYPHOPLASTY T7;  Surgeon: Hessie Knows, MD;  Location: [facility] ORS;  Service: Orthopedics;  Laterality: N/A;  . [place]  . LARYNGOSCOPY N/A 07/22/2014   Procedure: JET LARYNGOSCOPY with right vocal cord biopsy;  Surgeon: Margaretha Sheffield, MD;  Location: [facility] ORS;  Service: ENT;  Laterality: N/A;  . LUNG CANCER SURGERY    . NECK SURGERY      Family History  Problem Relation Age of Onset  . Kidney cancer Cousin   . Cancer Maternal Grandfather   . Cancer Maternal Grandmother   . Kidney disease Neg Hx   . Prostate cancer Neg Hx     Allergies  Allergen Reactions  . Alendronate Other (See Comments)    Esophageal burning  . Nitrofurantoin Nausea Only and Nausea And Vomiting  . Sulfa Antibiotics Other (See Comments)  . Ciprofloxacin Other (See Comments)    Other Reaction: GI UPSET  . Diphenhydramine Other (See Comments)       Assessment & Plan:   1. Aortic atherosclerosis (HCC) Recommend:  I do not find evidence of life style limiting vascular disease. The patient specifically denies life style limitation.  Previous noninvasive studies including ABI's of the legs do not identify critical vascular problems.  The patient should continue walking and begin a more formal exercise program. The patient should continue his antiplatelet therapy and aggressive treatment of the lipid abnormalities.  The patient should begin wearing graduated compression socks 15-20 mmHg strength to control her mild  edema.  Patient will follow-up with me on a PRN basis   2. Benign essential HTN Continue antihypertensive medications as already ordered, these medications have been reviewed and there are no changes at this time.   3. Lumbosacral radiculopathy Possible cause of patient's weakness and discomfort.  The patient has a neurosurgeon that she sees for her cervical spine however she is unsure if there  has been any evaluation of her lumbar spine.  Patient advised to follow-up with neurosurgery for evaluation  Current Outpatient Medications on File Prior to Visit  Medication Sig Dispense Refill  . benazepril (LOTENSIN) 5 MG tablet Take 1 tablet by mouth daily.    . bisacodyl (DULCOLAX) 10 MG suppository Place 1 suppository (10 mg total) rectally as needed for moderate constipation. 6 suppository 0  . calcium citrate-vitamin D 500-400 MG-UNIT chewable tablet Chew 1 tablet by mouth daily.     . DULoxetine (CYMBALTA) 20 MG capsule Take 1 capsule by mouth daily.    Marland Kitchen estradiol (ESTRACE) 1 MG tablet Take 1 mg by mouth daily.     . Multiple Vitamins-Minerals (OCUVITE ADULT 50+ PO) Take 1 tablet by mouth daily.     . nortriptyline (PAMELOR) 10 MG capsule Take 20 mg by mouth at bedtime.     Marland Kitchen omeprazole (PRILOSEC) 40 MG capsule Take 40 mg by mouth daily.     . polyethylene glycol (MIRALAX / GLYCOLAX) 17 g packet Take 17 g by mouth 2 (two) times daily. 14 each 0  . senna-docusate (SENOKOT-S) 8.6-50 MG tablet Take 2 tablets by mouth daily as needed for mild constipation. 30 tablet 0  . vitamin B-12 (CYANOCOBALAMIN) 1000 MCG tablet Take 1,000 mcg by mouth daily.     Marland Kitchen HYDROcodone-acetaminophen (NORCO/VICODIN) 5-325 MG tablet Take 2 tablets by mouth every 6 (six) hours as needed for moderate pain. (Patient not taking: Reported on 06/26/2019) 20 tablet 0  . traMADol (ULTRAM) 50 MG tablet Take 50 mg by mouth every 12 (twelve) hours as needed. (Patient not taking: Reported on 08/21/2019)     No current  facility-administered medications on file prior to visit.    There are no Patient Instructions on file for this visit. No follow-ups on file.   Kris Hartmann, NP

## 2019-09-05 ENCOUNTER — Other Ambulatory Visit: Payer: Self-pay

## 2019-09-05 ENCOUNTER — Encounter: Payer: Self-pay | Admitting: Obstetrics & Gynecology

## 2019-09-05 ENCOUNTER — Ambulatory Visit: Payer: Medicare Other | Admitting: Obstetrics & Gynecology

## 2019-09-05 VITALS — BP 120/70 | Ht 65.0 in | Wt 154.0 lb

## 2019-09-05 DIAGNOSIS — R350 Frequency of micturition: Secondary | ICD-10-CM | POA: Diagnosis not present

## 2019-09-05 DIAGNOSIS — N8111 Cystocele, midline: Secondary | ICD-10-CM

## 2019-09-05 NOTE — Progress Notes (Signed)
HPI:      [name] is a 79 y.o. No obstetric history on file. who presents today for her pessary follow up and examination related to her pelvic floor weakening.  Pt reports tolerating the pessary well with no vaginal bleeding and no vaginal discharge.  Symptoms of pelvic floor weakening have greatly improved. She is voiding and defecating without difficulty. She currently has a #5 ring type pessary.  PMHx: She  has a past medical history of Allergy, Anxiety, Depression, Fatty liver disease, nonalcoholic, GERD (gastroesophageal reflux disease), Heart disease, Hypertension, Lung cancer ([place]) (02/2014), and Urinary incontinence. Also,  has a past surgical history that includes Laryngoscopy (N/A, 07/22/2014); Laparoscopic hysterectomy (1977); Lung cancer surgery; Abdominal hysterectomy; Kyphoplasty (N/A, 05/04/2019); and Neck surgery., family history includes Cancer in her maternal grandfather and maternal grandmother; Kidney cancer in her cousin.,  reports that she quit smoking about 13 years ago. Her smoking use included cigarettes. She has a 12.50 pack-year smoking history. She has never used smokeless tobacco. She reports current alcohol use of about 2.0 standard drinks of alcohol per week. She reports that she does not use drugs.  She has a current medication list which includes the following prescription(s): benazepril, bisacodyl, calcium citrate-vitamin d, duloxetine, estradiol, multiple vitamins-minerals, nortriptyline, omeprazole, polyethylene glycol, senna-docusate, and vitamin b-12. Also, is allergic to alendronate, nitrofurantoin, sulfa antibiotics, ciprofloxacin, and diphenhydramine.  Review of Systems  All other systems reviewed and are negative.   Objective: BP 120/70   Ht 5\' 5"  (1.651 m)   Wt 154 lb (69.9 kg)   BMI 25.63 kg/m  Physical Exam Constitutional:      General: She is not in acute distress.    Appearance: She is well-developed.  Genitourinary:     Pelvic  exam was performed with patient supine.     Vagina normal.     No vaginal erythema or bleeding.     Cervix is absent.     Uterus is absent.     Genitourinary Comments: Cuff intact/ no lesions  HENT:     Head: Normocephalic and atraumatic.     Nose: Nose normal.  Abdominal:     General: There is no distension.     Palpations: Abdomen is soft.     Tenderness: There is no abdominal tenderness.  Musculoskeletal:        General: Normal range of motion.  Neurological:     Mental Status: She is alert and oriented to person, place, and time.     Cranial Nerves: No cranial nerve deficit.  Skin:    General: Skin is warm and dry.  Psychiatric:        Attention and Perception: Attention normal.        Mood and Affect: Mood normal.        Speech: Speech normal.        Behavior: Behavior normal.        Cognition and Memory: Cognition normal.        Judgment: Judgment normal.     Pessary Care Pessary removed and cleaned.  Vagina checked - without erosions - pessary replaced.  A/P:   ICD-10-CM   1. Urinary frequency  R35.0   2. Cystocele, midline  N81.11    Pessary was cleaned and replaced today. Instructions given for care. Concerning symptoms to observe for are counseled to patient. Follow up scheduled for 3 months.  A total of 20 minutes were spent face-to-face with the patient as well as preparation, review, communication,  and documentation during this encounter.   Barnett Applebaum, MD, [facility], [facility] 09/05/2019  1:48 PM

## 2019-09-14 ENCOUNTER — Other Ambulatory Visit: Payer: Self-pay | Admitting: Nurse Practitioner

## 2019-09-14 DIAGNOSIS — M5416 Radiculopathy, lumbar region: Secondary | ICD-10-CM

## 2019-09-14 DIAGNOSIS — G8929 Other chronic pain: Secondary | ICD-10-CM

## 2019-09-14 DIAGNOSIS — M5442 Lumbago with sciatica, left side: Secondary | ICD-10-CM

## 2019-09-30 ENCOUNTER — Ambulatory Visit
Admission: RE | Admit: 2019-09-30 | Discharge: 2019-09-30 | Disposition: A | Discharge status: Home / Self Care | Payer: Medicare Other | Source: Ambulatory Visit | Attending: Nurse Practitioner | Admitting: Nurse Practitioner

## 2019-09-30 DIAGNOSIS — M5442 Lumbago with sciatica, left side: Secondary | ICD-10-CM | POA: Diagnosis not present

## 2019-09-30 DIAGNOSIS — G8929 Other chronic pain: Secondary | ICD-10-CM

## 2019-09-30 DIAGNOSIS — M5441 Lumbago with sciatica, right side: Secondary | ICD-10-CM | POA: Insufficient documentation

## 2019-09-30 DIAGNOSIS — M5416 Radiculopathy, lumbar region: Secondary | ICD-10-CM

## 2019-10-01 ENCOUNTER — Other Ambulatory Visit: Payer: Self-pay

## 2019-10-01 ENCOUNTER — Ambulatory Visit: Payer: Medicare Other | Admitting: Family Medicine

## 2019-12-06 ENCOUNTER — Ambulatory Visit (INDEPENDENT_AMBULATORY_CARE_PROVIDER_SITE_OTHER): Payer: Medicare Other | Admitting: Obstetrics & Gynecology

## 2019-12-06 ENCOUNTER — Other Ambulatory Visit: Payer: Self-pay

## 2019-12-06 ENCOUNTER — Encounter: Payer: Self-pay | Admitting: Obstetrics & Gynecology

## 2019-12-06 VITALS — BP 140/80 | Wt 151.0 lb

## 2019-12-06 DIAGNOSIS — R350 Frequency of micturition: Secondary | ICD-10-CM | POA: Diagnosis not present

## 2019-12-06 DIAGNOSIS — N8111 Cystocele, midline: Secondary | ICD-10-CM

## 2019-12-06 NOTE — Progress Notes (Signed)
  HPI:      [name] is a 79 y.o. G1P1 who presents today for her pessary follow up and examination related to her pelvic floor weakening.  Pt reports tolerating the pessary well with no vaginal bleeding and no vaginal discharge.  Symptoms of pelvic floor weakening have greatly improved. She is voiding and defecating without difficulty. She currently has a Ring #5 pessary.  PMHx: She  has a past medical history of Allergy, Anxiety, Depression, Fatty liver disease, nonalcoholic, GERD (gastroesophageal reflux disease), Heart disease, Hypertension, Lung cancer ([place]) (02/2014), and Urinary incontinence. Also,  has a past surgical history that includes Laryngoscopy (N/A, 07/22/2014); Laparoscopic hysterectomy (1977); Lung cancer surgery; Abdominal hysterectomy; Kyphoplasty (N/A, 05/04/2019); and Neck surgery., family history includes Cancer in her maternal grandfather and maternal grandmother; Kidney cancer in her cousin.,  reports that she quit smoking about 13 years ago. Her smoking use included cigarettes. She has a 12.50 pack-year smoking history. She has never used smokeless tobacco. She reports current alcohol use of about 2.0 standard drinks of alcohol per week. She reports that she does not use drugs.  She has a current medication list which includes the following prescription(s): benazepril, bisacodyl, calcium citrate-vitamin d, duloxetine, estradiol, multiple vitamins-minerals, nortriptyline, omeprazole, polyethylene glycol, senna-docusate, and vitamin b-12. Also, is allergic to alendronate, nitrofurantoin, sulfa antibiotics, ciprofloxacin, and diphenhydramine.  Review of Systems  All other systems reviewed and are negative.   Objective: BP 140/80   Wt 151 lb (68.5 kg)   BMI 25.13 kg/m  Physical Exam Constitutional:      General: She is not in acute distress.    Appearance: She is well-developed.  Genitourinary:     Pelvic exam was performed with patient supine.     Vagina  normal.     No vaginal erythema or bleeding.     Genitourinary Comments: Cuff intact/ no lesions Absent uterus and cervix  HENT:     Head: Normocephalic and atraumatic.     Nose: Nose normal.  Abdominal:     General: There is no distension.     Palpations: Abdomen is soft.     Tenderness: There is no abdominal tenderness.  Musculoskeletal:        General: Normal range of motion.  Neurological:     Mental Status: She is alert and oriented to person, place, and time.     Cranial Nerves: No cranial nerve deficit.  Skin:    General: Skin is warm and dry.  Psychiatric:        Attention and Perception: Attention normal.        Mood and Affect: Mood normal.        Speech: Speech normal.        Behavior: Behavior normal.        Cognition and Memory: Cognition normal.        Judgment: Judgment normal.   Pessary Care Pessary removed and cleaned.  Vagina checked - without erosions - pessary replaced.  A/P:1. Cystocele, midline 2. Urinary frequency Pessary was cleaned and replaced today. Instructions given for care. Concerning symptoms to observe for are counseled to patient. Follow up scheduled for 3 months.  A total of 20 minutes were spent face-to-face with the patient as well as preparation, review, communication, and documentation during this encounter.   Barnett Applebaum, MD, [facility], [facility] 12/06/2019  2:09 PM

## 2019-12-27 DIAGNOSIS — F0390 Unspecified dementia without behavioral disturbance: Secondary | ICD-10-CM | POA: Insufficient documentation

## 2020-01-23 ENCOUNTER — Encounter: Payer: Self-pay | Admitting: Emergency Medicine

## 2020-01-23 ENCOUNTER — Emergency Department: Payer: Medicare Other

## 2020-01-23 ENCOUNTER — Other Ambulatory Visit: Payer: Self-pay

## 2020-01-23 ENCOUNTER — Emergency Department
Admission: EM | Admit: 2020-01-23 | Discharge: 2020-01-23 | Disposition: A | Discharge status: Home / Self Care | Payer: Medicare Other | Attending: Emergency Medicine | Admitting: Emergency Medicine

## 2020-01-23 DIAGNOSIS — R42 Dizziness and giddiness: Secondary | ICD-10-CM | POA: Diagnosis not present

## 2020-01-23 DIAGNOSIS — Z5321 Procedure and treatment not carried out due to patient leaving prior to being seen by health care provider: Secondary | ICD-10-CM | POA: Diagnosis not present

## 2020-01-23 DIAGNOSIS — W010XXA Fall on same level from slipping, tripping and stumbling without subsequent striking against object, initial encounter: Secondary | ICD-10-CM | POA: Diagnosis not present

## 2020-01-23 LAB — DIFFERENTIAL
Abs Immature Granulocytes: 0.06 10*3/uL (ref 0.00–0.07)
Basophils Absolute: 0.1 10*3/uL (ref 0.0–0.1)
Basophils Relative: 1 %
Eosinophils Absolute: 0.2 10*3/uL (ref 0.0–0.5)
Eosinophils Relative: 2 %
Immature Granulocytes: 1 %
Lymphocytes Relative: 13 %
Lymphs Abs: 1.4 10*3/uL (ref 0.7–4.0)
Monocytes Absolute: 0.8 10*3/uL (ref 0.1–1.0)
Monocytes Relative: 8 %
Neutro Abs: 7.9 10*3/uL — ABNORMAL HIGH (ref 1.7–7.7)
Neutrophils Relative %: 75 %

## 2020-01-23 LAB — APTT: aPTT: 27 seconds (ref 24–36)

## 2020-01-23 LAB — CBC
HCT: 41.1 % (ref 36.0–46.0)
Hemoglobin: 13.4 g/dL (ref 12.0–15.0)
MCH: 30.8 pg (ref 26.0–34.0)
MCHC: 32.6 g/dL (ref 30.0–36.0)
MCV: 94.5 fL (ref 80.0–100.0)
Platelets: 185 10*3/uL (ref 150–400)
RBC: 4.35 MIL/uL (ref 3.87–5.11)
RDW: 12.7 % (ref 11.5–15.5)
WBC: 10.3 10*3/uL (ref 4.0–10.5)
nRBC: 0 % (ref 0.0–0.2)

## 2020-01-23 LAB — COMPREHENSIVE METABOLIC PANEL
ALT: 15 U/L (ref 0–44)
AST: 16 U/L (ref 15–41)
Albumin: 3.5 g/dL (ref 3.5–5.0)
Alkaline Phosphatase: 58 U/L (ref 38–126)
Anion gap: 8 (ref 5–15)
BUN: 20 mg/dL (ref 8–23)
CO2: 27 mmol/L (ref 22–32)
Calcium: 9 mg/dL (ref 8.9–10.3)
Chloride: 106 mmol/L (ref 98–111)
Creatinine, Ser: 0.83 mg/dL (ref 0.44–1.00)
GFR, Estimated: >60 mL/min (ref >60)
Glucose, Bld: 103 mg/dL — ABNORMAL HIGH (ref 70–99)
Potassium: 4.3 mmol/L (ref 3.5–5.1)
Sodium: 141 mmol/L (ref 135–145)
Total Bilirubin: 0.6 mg/dL (ref 0.3–1.2)
Total Protein: 6.9 g/dL (ref 6.5–8.1)

## 2020-01-23 LAB — PROTIME-INR
INR: 0.9 (ref 0.8–1.2)
Prothrombin Time: 11.7 seconds (ref 11.4–15.2)

## 2020-01-23 NOTE — ED Triage Notes (Signed)
Pt comes into the ED via POV with her husband c/o new onset dizziness and loss of balance.  Pt states she went to bed with no problems and woke up this morning with dizziness, feeling "off-balance", and has almost had 2 falls today.  Pt otherwise neurologically intact at this time and is able to answer all questions.  Pt denies any new SHOB, CP, or N/V.

## 2020-02-04 ENCOUNTER — Ambulatory Visit
Admission: EM | Admit: 2020-02-04 | Discharge: 2020-02-04 | Disposition: A | Discharge status: Home / Self Care | Payer: Medicare Other | Attending: Family Medicine | Admitting: Family Medicine

## 2020-02-04 ENCOUNTER — Other Ambulatory Visit: Payer: Self-pay

## 2020-02-04 DIAGNOSIS — G6289 Other specified polyneuropathies: Secondary | ICD-10-CM | POA: Diagnosis not present

## 2020-02-04 DIAGNOSIS — R269 Unspecified abnormalities of gait and mobility: Secondary | ICD-10-CM | POA: Diagnosis not present

## 2020-02-04 MED ORDER — DULOXETINE HCL 20 MG PO CPEP: ORAL_CAPSULE | ORAL | 0 refills | Status: DC

## 2020-02-04 NOTE — Discharge Instructions (Signed)
Cymbalta 20 mg nightly. Increase to 40 mg after 1 week. Then 60 mg after another week. Stop at 60 mg.  Follow up with neurology.  Take care  Dr. Lacinda Axon

## 2020-02-04 NOTE — ED Provider Notes (Signed)
[facility]    CSN: [id number] Arrival date & time: 02/04/20  1250      History   Chief Complaint Chief Complaint  Patient presents with  . Peripheral Neuropathy   HPI 79 year old female presents with reports of difficulty ambulating.  Patient has multiple comorbidities and has cognitive impairment.  History is difficult to obtain given her cognitive impairment.  She reports that she is having difficulty ambulating.  She states that she recently stopped taking Cymbalta.  Later on in the history she states that she is taking Cymbalta.  It is unclear to me whether she is taking this medication or not.  There have been medication changes in the chart as of recent related to peripheral neuropathy.  She states that she has been unable to get a hold of her physicians and decided to come in for evaluation.  Patient states that her primary concern is having difficulty ambulating.  EMR reflects a history of chronic back pain as well as hip issues.  She also has known peripheral neuropathy.  Past Medical History:  Diagnosis Date  . Allergy   . Anxiety   . Depression   . Fatty liver disease, nonalcoholic   . GERD (gastroesophageal reflux disease)   . Heart disease   . Hypertension   . Lung cancer ([place]) 02/2014   RUL Lobectomy  . Urinary incontinence     Patient Active Problem List   Diagnosis Date Noted  . Ataxia 07/24/2019  . Confusion 07/24/2019  . Sepsis ([place]) 05/22/2019  . Sepsis secondary to UTI ([place]) 05/22/2019  . Hypotension 05/22/2019  . Acute kidney injury superimposed on CKD ([place]) 05/22/2019  . Compression fracture of body of thoracic vertebra ([place]) 05/04/2019  . Compression fracture of C-spine ([place]) 05/04/2019  . Fall 05/03/2019  . Anxiety, generalized 03/19/2019  . Exertional shortness of breath 10/03/2018  . Heart palpitations 10/02/2018  . Mild cognitive impairment 03/28/2018  . Aortic atherosclerosis ([place]) 02/24/2018  . Neurologic gait disorder  01/13/2018  . Cystocele, midline 04/28/2017  . Recurrent major depressive disorder, in partial remission ([place]) 03/17/2017  . Neuropathy 03/01/2017  . Lumbosacral radiculopathy 02/16/2017  . Bilateral pain of leg and foot 12/16/2016  . Fatigue 12/16/2016  . Trochanteric bursitis of both hips 12/16/2016  . Weakness of both legs 12/16/2016  . Stress incontinence of urine 10/15/2014  . Polypharmacy 09/02/2014  . Primary cancer of right upper lobe of lung ([place]) 07/19/2014  . Acid reflux 07/18/2014  . 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 Chronic kidney disease (CKD), stage III (moderate) ([place]) 08/30/2013  . Benign essential HTN 08/30/2013  . Fatty liver disease, nonalcoholic 41/93/7902  . Fibrositis 08/30/2013  . Hot flash, menopausal 08/30/2013  . Billowing mitral valve 08/30/2013  . Allergic rhinitis, seasonal 08/30/2013  . Fibromyalgia 08/30/2013    Past Surgical History:  Procedure Laterality Date  . ABDOMINAL HYSTERECTOMY    . KYPHOPLASTY N/A 05/04/2019   Procedure: KYPHOPLASTY T7;  Surgeon: Hessie Knows, MD;  Location: [facility] ORS;  Service: Orthopedics;  Laterality: N/A;  . [place]  . LARYNGOSCOPY N/A 07/22/2014   Procedure: JET LARYNGOSCOPY with right vocal cord biopsy;  Surgeon: Margaretha Sheffield, MD;  Location: [facility] ORS;  Service: ENT;  Laterality: N/A;  . LUNG CANCER SURGERY    . NECK SURGERY      OB History   No obstetric history on file.      Home Medications    Prior to Admission medications  Medication Sig Start Date End Date Taking? Authorizing Provider  benazepril (LOTENSIN) 5 MG tablet Take 1 tablet by mouth daily. 03/04/14  Yes [provider]  bisacodyl (DULCOLAX) 10 MG suppository Place 1 suppository (10 mg total) rectally as needed for moderate constipation. 07/13/19  Yes Norval Gable, MD  buPROPion (WELLBUTRIN XL) 150 MG 24 hr tablet Take 150 mg by mouth every morning. 01/08/20  Yes  [provider]  calcium citrate-vitamin D 500-400 MG-UNIT chewable tablet Chew 1 tablet by mouth daily.    Yes [provider]  citalopram (CELEXA) 10 MG tablet Take 10 mg by mouth daily. 01/14/20  Yes [provider]  estradiol (ESTRACE) 1 MG tablet Take 1 mg by mouth daily.  04/29/17  Yes [provider]  Multiple Vitamins-Minerals (OCUVITE ADULT 50+ PO) Take 1 tablet by mouth daily.    Yes [provider]  omeprazole (PRILOSEC) 40 MG capsule Take 40 mg by mouth daily.    Yes [provider]  polyethylene glycol (MIRALAX / GLYCOLAX) 17 g packet Take 17 g by mouth 2 (two) times daily. 05/07/19  Yes Dhungel, Nishant, MD  senna-docusate (SENOKOT-S) 8.6-50 MG tablet Take 2 tablets by mouth daily as needed for mild constipation. 05/07/19 05/06/20 Yes Dhungel, Nishant, MD  traMADol (ULTRAM) 50 MG tablet Take 50 mg by mouth 2 (two) times daily as needed. 01/21/20  Yes [provider]  vitamin B-12 (CYANOCOBALAMIN) 1000 MCG tablet Take 1,000 mcg by mouth daily.    Yes [provider]  DULoxetine (CYMBALTA) 20 MG capsule 20 mg nightly. Increase to 40 mg after 1 week, then to 60 mg after an additional week. 02/04/20   Coral Spikes, DO  nortriptyline (PAMELOR) 10 MG capsule Take 20 mg by mouth at bedtime.  03/21/19 02/04/20  [provider]    Family History Family History  Problem Relation Age of Onset  . Kidney cancer Cousin   . Cancer Maternal Grandfather   . Cancer Maternal Grandmother   . Kidney disease Neg Hx   . Prostate cancer Neg Hx     Social History Social History   Tobacco Use  . Smoking status: Former Smoker    Packs/day: 0.50    Years: 25.00    Pack years: 12.50    Types: Cigarettes    Quit date: 07/18/2006    Years since quitting: 13.5  . Smokeless tobacco: Never Used  Vaping Use  . Vaping Use: Never used  Substance Use Topics  . Alcohol use: Yes    Alcohol/week: 2.0 standard drinks    Types: 1  Glasses of wine, 1 Cans of beer per week    Comment: occassionally about 4 times a year  . Drug use: No     Allergies   Alendronate, Nitrofurantoin, Sulfa antibiotics, Ciprofloxacin, and Diphenhydramine   Review of Systems Review of Systems  Per HPI  Physical Exam Triage Vital Signs ED Triage Vitals  Enc Vitals Group     BP 02/04/20 1539 131/74     Pulse Rate 02/04/20 1539 79     Resp 02/04/20 1539 15     Temp 02/04/20 1539 98.4 F (36.9 C)     Temp Source 02/04/20 1539 Oral     SpO2 02/04/20 1539 96 %     Weight 02/04/20 1537 162 lb (73.5 kg)     Height 02/04/20 1537 5\' 5"  (1.651 m)     Head Circumference --      Peak Flow --  Pain Score 02/04/20 1536 7     Pain Loc --      Pain Edu? --      Excl. in [place]? --    Updated Vital Signs BP 131/74 (BP Location: Left Arm)   Pulse 79   Temp 98.4 F (36.9 C) (Oral)   Resp 15   Ht 5\' 5"  (1.651 m)   Wt 73.5 kg   SpO2 96%   BMI 26.96 kg/m   Visual Acuity Right Eye Distance:   Left Eye Distance:   Bilateral Distance:    Right Eye Near:   Left Eye Near:    Bilateral Near:     Physical Exam Vitals and nursing note reviewed.  Constitutional:      Comments: Frail elderly female in no acute distress.  HENT:     Head: Normocephalic and atraumatic.  Eyes:     General:        Right eye: No discharge.        Left eye: No discharge.     Conjunctiva/sclera: Conjunctivae normal.  Cardiovascular:     Rate and Rhythm: Normal rate and regular rhythm.  Pulmonary:     Effort: Pulmonary effort is normal. No respiratory distress.  Musculoskeletal:     Comments: Patient endorsing tenderness over the lateral hips.    UC Treatments / Results  Labs (all labs ordered are listed, but only abnormal results are displayed) Labs Reviewed - No data to display  EKG   Radiology No results found.  Procedures Procedures (including critical care time)  Medications Ordered in UC Medications - No data to display  Initial  Impression / Assessment and Plan / UC Course  I have reviewed the triage vital signs and the nursing notes.  Pertinent labs & imaging results that were available during my care of the patient were reviewed by me and considered in my medical decision making (see chart for details).    79 year old female presents with complaints of gait difficulty.  Advised to take Cymbalta as outlined below.  I believe that she would benefit from physical therapy.  Hand written Rx given for physical therapy.  Needs follow-up with her primary care provider and/or neurology.  Final Clinical Impressions(s) / UC Diagnoses   Final diagnoses:  Other polyneuropathy  Gait difficulty     Discharge Instructions     Cymbalta 20 mg nightly. Increase to 40 mg after 1 week. Then 60 mg after another week. Stop at 60 mg.  Follow up with neurology.  Take care  Dr. Lacinda Axon    ED Prescriptions    Medication Sig Dispense Auth. Provider   DULoxetine (CYMBALTA) 20 MG capsule 20 mg nightly. Increase to 40 mg after 1 week, then to 60 mg after an additional week. 90 capsule Thersa Salt G, DO     PDMP not reviewed this encounter.   Coral Spikes, [place] 02/04/20 2043

## 2020-02-04 NOTE — ED Triage Notes (Signed)
Patient states that he has been having neuropathy end her feet. States that she stopped taking cymbalta which was helping her. States that she stopped taking that at night but is taking some at night. Patient states that she has been unable to walk due to neuropathy.

## 2020-02-21 ENCOUNTER — Ambulatory Visit: Payer: Medicare Other | Admitting: Physical Therapy

## 2020-02-28 ENCOUNTER — Ambulatory Visit: Payer: Medicare Other | Admitting: Physical Therapy

## 2020-03-05 ENCOUNTER — Ambulatory Visit
Admission: RE | Admit: 2020-03-05 | Discharge: 2020-03-05 | Disposition: A | Discharge status: Home / Self Care | Payer: Medicare Other | Source: Ambulatory Visit | Attending: Oncology | Admitting: Oncology

## 2020-03-05 ENCOUNTER — Other Ambulatory Visit: Payer: Self-pay

## 2020-03-05 DIAGNOSIS — C3411 Malignant neoplasm of upper lobe, right bronchus or lung: Secondary | ICD-10-CM | POA: Diagnosis not present

## 2020-03-05 LAB — POCT I-STAT CREATININE: Creatinine, Ser: 1 mg/dL (ref 0.44–1.00)

## 2020-03-05 MED ORDER — IOHEXOL 300 MG/ML  SOLN
75.0000 mL | Freq: Once | INTRAMUSCULAR | Status: AC | PRN
  Administered 2020-03-05: 75 mL via INTRAVENOUS

## 2020-03-07 ENCOUNTER — Other Ambulatory Visit: Payer: Self-pay

## 2020-03-07 ENCOUNTER — Encounter: Payer: Self-pay | Admitting: Obstetrics & Gynecology

## 2020-03-07 ENCOUNTER — Ambulatory Visit (INDEPENDENT_AMBULATORY_CARE_PROVIDER_SITE_OTHER): Payer: Medicare Other | Admitting: Obstetrics & Gynecology

## 2020-03-07 VITALS — Ht 65.0 in | Wt 151.0 lb

## 2020-03-07 DIAGNOSIS — N8111 Cystocele, midline: Secondary | ICD-10-CM | POA: Diagnosis not present

## 2020-03-07 DIAGNOSIS — N393 Stress incontinence (female) (male): Secondary | ICD-10-CM | POA: Diagnosis not present

## 2020-03-07 NOTE — Progress Notes (Signed)
HPI:      [name] is a 80 y.o. No obstetric history on file. who presents today for her pessary follow up and examination related to her pelvic floor weakening.  Pt reports tolerating the pessary well with no vaginal bleeding and no vaginal discharge.  Symptoms of pelvic floor weakening have greatly improved. She is voiding and defecating without difficulty. She currently has a Incontinence ring size #5 pessary.  PMHx: She  has a past medical history of Allergy, Anxiety, Depression, Fatty liver disease, nonalcoholic, GERD (gastroesophageal reflux disease), Heart disease, Hypertension, Lung cancer ([place]) (02/2014), and Urinary incontinence. Also,  has a past surgical history that includes Laryngoscopy (N/A, 07/22/2014); Laparoscopic hysterectomy (1977); Lung cancer surgery; Abdominal hysterectomy; Kyphoplasty (N/A, 05/04/2019); and Neck surgery., family history includes Cancer in her maternal grandfather and maternal grandmother; Kidney cancer in her cousin.,  reports that she quit smoking about 13 years ago. Her smoking use included cigarettes. She has a 12.50 pack-year smoking history. She has never used smokeless tobacco. She reports current alcohol use of about 2.0 standard drinks of alcohol per week. She reports that she does not use drugs.  She has a current medication list which includes the following prescription(s): benazepril, bisacodyl, bupropion, calcium citrate-vitamin d, citalopram, duloxetine, estradiol, multiple vitamins-minerals, omeprazole, polyethylene glycol, senna-docusate, tramadol, vitamin b-12, and [DISCONTINUED] nortriptyline. Also, is allergic to alendronate, nitrofurantoin, sulfa antibiotics, ciprofloxacin, and diphenhydramine.  Review of Systems  All other systems reviewed and are negative.   Objective: Ht 5\' 5"  (1.651 m)   Wt 151 lb (68.5 kg)   BMI 25.13 kg/m  Physical Exam Constitutional:      General: She is not in acute distress.    Appearance: She is  well-developed.  Genitourinary:     Vagina normal.     Genitourinary Comments: Cuff intact/ no lesions  Absent uterus and cervix     No vaginal erythema or bleeding.     Pelvic exam was performed with patient supine.  HENT:     Head: Normocephalic and atraumatic.     Nose: Nose normal.  Abdominal:     General: There is no distension.     Palpations: Abdomen is soft.     Tenderness: There is no abdominal tenderness.  Musculoskeletal:        General: Normal range of motion.  Neurological:     Mental Status: She is alert and oriented to person, place, and time.     Cranial Nerves: No cranial nerve deficit.  Skin:    General: Skin is warm and dry.  Psychiatric:        Attention and Perception: Attention normal.        Mood and Affect: Mood normal.        Speech: Speech normal.        Behavior: Behavior normal.        Cognition and Memory: Cognition normal.        Judgment: Judgment normal.     Pessary Care Pessary removed and cleaned.  Vagina checked - without erosions - pessary replaced.  A/P:   ICD-10-CM   1. 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 Stress incontinence of urine  N39.3    Pessary was cleaned and replaced today. Instructions given for care. Concerning symptoms to observe for are counseled to patient. Follow up scheduled for 3 months.  A total of 20 minutes were spent face-to-face with the patient as well as preparation, review, communication, and documentation during this encounter.  Barnett Applebaum, MD, [facility], [facility] 03/07/2020  3:04 PM

## 2020-03-07 NOTE — Progress Notes (Signed)
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  INTERVAL HISTORY: Patient returns to clinic today for routine yearly evaluation and discussion of her imaging results.  She currently feels well and is at her baseline. She has no neurologic complaints.  She denies any recent fevers or illnesses.  She has a good appetite and denies weight loss. She has no chest pain, shortness of breath, cough, or hemoptysis. She denies any nausea, vomiting, constipation, or diarrhea. She has no urinary complaints.  Patient offers no specific complaints today.  REVIEW OF SYSTEMS:   Review of Systems  Constitutional: Negative.  Negative for fever, malaise/fatigue and weight loss.  Respiratory: Negative.  Negative for cough, hemoptysis and shortness of breath.   Cardiovascular: Negative.  Negative for chest pain and leg swelling.  Gastrointestinal: Negative.  Negative for abdominal pain.  Genitourinary: Negative.  Negative for dysuria.  Musculoskeletal: Negative.  Negative for back pain.  Skin: Negative.  Negative for rash.  Neurological: Negative.  Negative for sensory change, focal weakness and weakness.  Psychiatric/Behavioral: Negative.  The patient is not nervous/anxious.     As per HPI. Otherwise, a complete review of systems is negative.  PAST MEDICAL HISTORY: Past Medical History:  Diagnosis Date  . Allergy   . Anxiety   . Depression   . Fatty liver disease, nonalcoholic   . GERD (gastroesophageal reflux disease)   . Heart disease   . Hypertension   . Lung cancer ([place]) 02/2014   RUL Lobectomy  . Urinary incontinence     PAST SURGICAL HISTORY: Past Surgical History:  Procedure Laterality Date  . ABDOMINAL HYSTERECTOMY    . KYPHOPLASTY N/A  05/04/2019   Procedure: KYPHOPLASTY T7;  Surgeon: Christy Knows, MD;  Location: [facility] ORS;  Service: Orthopedics;  Laterality: N/A;  . [place]  . LARYNGOSCOPY N/A 07/22/2014   Procedure: JET LARYNGOSCOPY with right vocal cord biopsy;  Surgeon: Christy Sheffield, MD;  Location: [facility] ORS;  Service: ENT;  Laterality: N/A;  . LUNG CANCER SURGERY    . NECK SURGERY      FAMILY HISTORY Family History  Problem Relation Age of Onset  . Kidney cancer Cousin   . Cancer Maternal Grandfather   . Cancer Maternal Grandmother   . Kidney disease Neg Hx   . Prostate cancer Neg Hx        ADVANCED DIRECTIVES:    HEALTH MAINTENANCE: Social History   Tobacco Use  . Smoking status: Former Smoker    Packs/day: 0.50    Years: 25.00    Pack years: 12.50    Types: Cigarettes    Quit date: 07/18/2006    Years since quitting: 13.6  . Smokeless tobacco: Never Used  Vaping Use  . Vaping Use: Never used  Substance Use Topics  . Alcohol use: Yes    Alcohol/week: 2.0 standard drinks    Types: 1 Glasses of wine, 1 Cans of beer per week    Comment: occassionally about 4 times a year  . Drug use: No     Allergies  Allergen Reactions  . Alendronate Other (See Comments)    Esophageal burning  . Nitrofurantoin Nausea Only and Nausea And Vomiting  . Sulfa Antibiotics Other (See Comments)  . Ciprofloxacin Other (See Comments)  Other Reaction: GI UPSET  . Diphenhydramine Other (See Comments)    Current Outpatient Medications  Medication Sig Dispense Refill  . benazepril (LOTENSIN) 5 MG tablet Take 1 tablet by mouth daily.    . calcium citrate-vitamin D 500-400 MG-UNIT chewable tablet Chew 1 tablet by mouth daily.     . DULoxetine (CYMBALTA) 20 MG capsule 20 mg nightly. Increase to 40 mg after 1 week, then to 60 mg after an additional week. 90 capsule 0  . estradiol (ESTRACE) 1 MG tablet Take 1 mg by mouth daily.     . Multiple Vitamins-Minerals (OCUVITE ADULT 50+ PO) Take 1 tablet  by mouth daily.     Marland Kitchen omeprazole (PRILOSEC) 40 MG capsule Take 40 mg by mouth daily.     . vitamin B-12 (CYANOCOBALAMIN) 1000 MCG tablet Take 1,000 mcg by mouth daily.      No current facility-administered medications for this visit.    OBJECTIVE: Vitals:   03/12/20 1111  BP: 127/64  Pulse: 91  Temp: (!) 96.7 F (35.9 C)  SpO2: 98%     Body mass index is 27.21 kg/m.    ECOG FS:0 - Asymptomatic  General: Well-developed, well-nourished, no acute distress.  Sitting in a wheelchair. Eyes: Pink conjunctiva, anicteric sclera. HEENT: Normocephalic, moist mucous membranes. Lungs: No audible wheezing or coughing. Heart: Regular rate and rhythm. Abdomen: Soft, nontender, no obvious distention. Musculoskeletal: No edema, cyanosis, or clubbing. Neuro: Alert, answering all questions appropriately. Cranial nerves grossly intact. Skin: No rashes or petechiae noted. Psych: Normal affect.   LAB RESULTS:  Lab Results  Component Value Date   NA 141 01/23/2020   K 4.3 01/23/2020   CL 106 01/23/2020   CO2 27 01/23/2020   GLUCOSE 103 (H) 01/23/2020   BUN 20 01/23/2020   CREATININE 1.00 03/05/2020   CALCIUM 9.0 01/23/2020   PROT 6.9 01/23/2020   ALBUMIN 3.5 01/23/2020   AST 16 01/23/2020   ALT 15 01/23/2020   ALKPHOS 58 01/23/2020   BILITOT 0.6 01/23/2020   GFRNONAA >60 01/23/2020   GFRAA >60 05/25/2019    Lab Results  Component Value Date   WBC 10.3 01/23/2020   NEUTROABS 7.9 (H) 01/23/2020   HGB 13.4 01/23/2020   HCT 41.1 01/23/2020   MCV 94.5 01/23/2020   PLT 185 01/23/2020     STUDIES: CT Chest W Contrast  Result Date: 03/05/2020 CLINICAL DATA:  Restaging lung cancer post right upper lobectomy in 2016. No current complaints. EXAM: CT CHEST WITH CONTRAST TECHNIQUE: Multidetector CT imaging of the chest was performed during intravenous contrast administration. CONTRAST:  94mL OMNIPAQUE IOHEXOL 300 MG/ML  SOLN COMPARISON:  CT chest 02/24/2018.  Thoracic spine CT  05/03/2019. FINDINGS: Cardiovascular: Mild atherosclerosis of the aorta, great vessels and coronary arteries. No acute vascular findings are seen. There is no evidence of acute pulmonary embolism. The heart size is normal. There is no pericardial effusion. Mediastinum/Nodes: There are no enlarged mediastinal, hilar or axillary lymph nodes. The thyroid gland, trachea and esophagus demonstrate no significant findings. Lungs/Pleura: There is no pleural effusion or pneumothorax. Stable postsurgical changes following right upper lobectomy. There is stable mild biapical scarring. Scattered small pulmonary nodules are unchanged, including a 5 mm perifissural nodule along the minor fissure on image 99/3 and a 6 x 5 mm right lower lobe nodule on image 109/3. No new or enlarging nodules identified. Upper abdomen: The visualized upper abdomen appears stable without significant findings. Musculoskeletal/Chest wall: There is no chest wall mass or suspicious osseous  finding. Status post bilateral mastectomy and subpectoral breast implant placement. Thoracotomy changes within the right chest wall are stable. Interval spinal augmentation at T7 for a fracture. There is a mild superior endplate compression deformity at T3 which appears new from previous thoracic spine CT. No evidence of osseous metastatic disease. IMPRESSION: 1. Stable chest CT status post right upper lobectomy. No evidence of local recurrence or metastatic disease. 2. Stable small pulmonary nodules bilaterally consistent with benign findings. 3. Interval T7 spinal augmentation for a fracture. Mild superior endplate compression deformity at T3 appears new from previous thoracic spine CT. 4. Aortic Atherosclerosis (ICD10-I70.0). Electronically Signed   By: Richardean Sale M.D.   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  PLAN:     1. 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 Pathology and surgical report reviewed independently.  Her most recent imaging with CT scan on March 05, 2020 reviewed independently and reported as above with no obvious evidence of recurrent or progressive disease.  Patient is now greater than 5 years out from her surgery.  No further follow-up or imaging is necessary.  Please refer patient back if there are any questions or concerns.    I spent a total of 20 minutes reviewing chart data, face-to-face evaluation with the patient, counseling and coordination of care as detailed above.  Patient expressed understanding and was in agreement with this plan. She also understands that She can call clinic at any time with any questions, concerns, or complaints.    Lloyd Huger, MD   03/13/2020 6:48 AM

## 2020-03-11 ENCOUNTER — Encounter: Payer: Medicare Other | Admitting: Physical Therapy

## 2020-03-12 ENCOUNTER — Inpatient Hospital Stay: Payer: Medicare Other | Attending: Oncology | Admitting: Oncology

## 2020-03-12 VITALS — BP 127/64 | HR 91 | Temp 96.7°F | Wt 163.5 lb

## 2020-03-12 DIAGNOSIS — Z85118 Personal history of other malignant neoplasm of bronchus and lung: Secondary | ICD-10-CM | POA: Diagnosis not present

## 2020-03-12 DIAGNOSIS — C3411 Malignant neoplasm of upper lobe, right bronchus or lung: Secondary | ICD-10-CM

## 2020-03-12 DIAGNOSIS — Z87891 Personal history of nicotine dependence: Secondary | ICD-10-CM | POA: Diagnosis not present

## 2020-03-13 ENCOUNTER — Encounter: Payer: Medicare Other | Admitting: Physical Therapy

## 2020-03-14 ENCOUNTER — Other Ambulatory Visit: Payer: Self-pay

## 2020-03-14 ENCOUNTER — Ambulatory Visit
Admission: EM | Admit: 2020-03-14 | Discharge: 2020-03-14 | Disposition: A | Discharge status: Home / Self Care | Payer: Medicare Other | Attending: Sports Medicine | Admitting: Sports Medicine

## 2020-03-14 ENCOUNTER — Encounter: Payer: Self-pay | Admitting: Emergency Medicine

## 2020-03-14 DIAGNOSIS — K5900 Constipation, unspecified: Secondary | ICD-10-CM

## 2020-03-14 DIAGNOSIS — R410 Disorientation, unspecified: Secondary | ICD-10-CM

## 2020-03-14 DIAGNOSIS — R110 Nausea: Secondary | ICD-10-CM

## 2020-03-14 MED ORDER — ONDANSETRON HCL 4 MG PO TABS: ORAL_TABLET | ORAL | 0 refills | Status: DC

## 2020-03-14 NOTE — Discharge Instructions (Addendum)
I have prescribed a medication for your nausea.  This is done a trial medication to see if it helps. I would encourage you to follow-up with your primary care physician for this chronic condition.  I hope you get to feeling better, Dr. Drema Dallas

## 2020-03-14 NOTE — ED Provider Notes (Signed)
[facility]    CSN: [id number] Arrival date & time: 03/14/20  1729      History   Chief Complaint Chief Complaint  Patient presents with  . Nausea  . Dizziness    HPI Christy [name] is a 80 y.o. female.   Patient pleasant 80 year old female who presents for evaluation of the above issues.  Her primary complaint is nausea that is been going on for about 4 months. Chronic in nature. She reports she is seen 5 or 6 doctors for this. Normally sees Dr. Genene Churn for her ongoing medical care.  Regarding her nausea, it is not associated with any vomiting.  She has chronic constipation and tells me that she has been treated for that.  At times she seems confused during the history and the exam.  She is passing gas.  No fever shakes chills.  No diarrhea or vomiting.  She seems very fixated on the fact that she has ongoing nausea and is concerned because nobody seems to be addressing it.  She seems somewhat anxious throughout the history and examination.  She is here with her husband.  No red flag signs or symptoms elicited on history but again it was difficult to obtain a complete history.  She denies chest pain or shortness of breath.  She also describes bilateral leg weakness but is being followed by neurology for this and has a diagnosis of neuropathy.  When I questioned her she does not complain of any dizziness at the present time.  She did tell the triage nurse that she was dizzy.     Past Medical History:  Diagnosis Date  . Allergy   . Anxiety   . Depression   . Fatty liver disease, nonalcoholic   . GERD (gastroesophageal reflux disease)   . Heart disease   . Hypertension   . Lung cancer ([place]) 02/2014   RUL Lobectomy  . Urinary incontinence     Patient Active Problem List   Diagnosis Date Noted  . Ataxia 07/24/2019  . Confusion 07/24/2019  . Sepsis ([place]) 05/22/2019  . Sepsis secondary to UTI ([place]) 05/22/2019  . Hypotension 05/22/2019  . Acute kidney  injury superimposed on CKD ([place]) 05/22/2019  . Compression fracture of body of thoracic vertebra ([place]) 05/04/2019  . Compression fracture of C-spine ([place]) 05/04/2019  . Fall 05/03/2019  . Anxiety, generalized 03/19/2019  . Exertional shortness of breath 10/03/2018  . Heart palpitations 10/02/2018  . Mild cognitive impairment 03/28/2018  . Aortic atherosclerosis ([place]) 02/24/2018  . 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 Cystocele, midline 04/28/2017  . Recurrent major depressive disorder, in partial remission ([place]) 03/17/2017  . Neuropathy 03/01/2017  . Lumbosacral radiculopathy 02/16/2017  . Bilateral pain of leg and foot 12/16/2016  . Fatigue 12/16/2016  . Trochanteric bursitis of both hips 12/16/2016  . Weakness of both legs 12/16/2016  . Stress incontinence of urine 10/15/2014  . Polypharmacy 09/02/2014  . Primary cancer of right upper lobe of lung ([place]) 07/19/2014  . Acid reflux 07/18/2014  . 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 Chronic kidney disease (CKD), stage III (moderate) ([place]) 08/30/2013  . Benign essential HTN 08/30/2013  . Fatty liver disease, nonalcoholic 28/78/6767  . Fibrositis 08/30/2013  . Hot flash, menopausal 08/30/2013  . Billowing mitral valve 08/30/2013  . Allergic rhinitis, seasonal 08/30/2013  . Fibromyalgia 08/30/2013    Past Surgical History:  Procedure Laterality Date  . ABDOMINAL HYSTERECTOMY    . KYPHOPLASTY N/A 05/04/2019  Procedure: KYPHOPLASTY T7;  Surgeon: Hessie Knows, MD;  Location: [facility] ORS;  Service: Orthopedics;  Laterality: N/A;  . [place]  . LARYNGOSCOPY N/A 07/22/2014   Procedure: JET LARYNGOSCOPY with right vocal cord biopsy;  Surgeon: Margaretha Sheffield, MD;  Location: [facility] ORS;  Service: ENT;  Laterality: N/A;  . LUNG CANCER SURGERY    . NECK SURGERY      OB History   No obstetric history on file.      Home Medications    Prior to Admission medications    Medication Sig Start Date End Date Taking? Authorizing Provider  benazepril (LOTENSIN) 5 MG tablet Take 1 tablet by mouth daily. 03/04/14  Yes [provider]  calcium citrate-vitamin D 500-400 MG-UNIT chewable tablet Chew 1 tablet by mouth daily.    Yes [provider]  DULoxetine (CYMBALTA) 20 MG capsule 20 mg nightly. Increase to 40 mg after 1 week, then to 60 mg after an additional week. 02/04/20  Yes Cook, Jayce G, DO  estradiol (ESTRACE) 1 MG tablet Take 1 mg by mouth daily.  04/29/17  Yes [provider]  Multiple Vitamins-Minerals (OCUVITE ADULT 50+ PO) Take 1 tablet by mouth daily.    Yes [provider]  omeprazole (PRILOSEC) 40 MG capsule Take 40 mg by mouth daily.    Yes [provider]  ondansetron (ZOFRAN) 4 MG tablet Take 1 tablet daily as needed for nausea.  Not to exceed 1 tablet a day. 03/14/20  Yes Verda Cumins, MD  vitamin B-12 (CYANOCOBALAMIN) 1000 MCG tablet Take 1,000 mcg by mouth daily.    Yes [provider]  nortriptyline (PAMELOR) 10 MG capsule Take 20 mg by mouth at bedtime.  03/21/19 02/04/20  [provider]    Family History Family History  Problem Relation Age of Onset  . Kidney cancer Cousin   . Cancer Maternal Grandfather   . Cancer Maternal Grandmother   . Kidney disease Neg Hx   . Prostate cancer Neg Hx     Social History Social History   Tobacco Use  . Smoking status: Former Smoker    Packs/day: 0.50    Years: 25.00    Pack years: 12.50    Types: Cigarettes    Quit date: 07/18/2006    Years since quitting: 13.6  . Smokeless tobacco: Never Used  Vaping Use  . Vaping Use: Never used  Substance Use Topics  . Alcohol use: Yes    Alcohol/week: 2.0 standard drinks    Types: 1 Glasses of wine, 1 Cans of beer per week    Comment: occassionally about 4 times a year  . Drug use: No     Allergies   Alendronate, Nitrofurantoin, Sulfa antibiotics, Ciprofloxacin, and  Diphenhydramine   Review of Systems Review of Systems  Constitutional: Positive for appetite change. Negative for chills, diaphoresis, fatigue and fever.  HENT: Negative.   Eyes: Negative.   Respiratory: Negative.   Cardiovascular: Negative.   Gastrointestinal: Positive for constipation and nausea. Negative for abdominal pain, diarrhea and vomiting.  Genitourinary: Negative.   Skin: Negative.   Neurological: Negative for dizziness, syncope, light-headedness, numbness and headaches.  All other systems reviewed and are negative.    Physical Exam Triage Vital Signs ED Triage Vitals  Enc Vitals Group     BP 03/14/20 1801 (!) 149/76     Pulse Rate 03/14/20 1801 73     Resp 03/14/20 1801 14     Temp 03/14/20 1801 98.5 F (36.9  C)     Temp Source 03/14/20 1801 Oral     SpO2 03/14/20 1801 97 %     Weight 03/14/20 1759 163 lb (73.9 kg)     Height 03/14/20 1759 5\' 5"  (1.651 m)     Head Circumference --      Peak Flow --      Pain Score 03/14/20 1759 4     Pain Loc --      Pain Edu? --      Excl. in [place]? --    No data found.  Updated Vital Signs BP (!) 149/76 (BP Location: Left Arm)   Pulse 73   Temp 98.5 F (36.9 C) (Oral)   Resp 14   Ht 5\' 5"  (1.651 m)   Wt 73.9 kg   SpO2 97%   BMI 27.12 kg/m   Visual Acuity Right Eye Distance:   Left Eye Distance:   Bilateral Distance:    Right Eye Near:   Left Eye Near:    Bilateral Near:     Physical Exam Vitals and nursing note reviewed.  Constitutional:      General: She is not in acute distress.    Appearance: Normal appearance. She is not ill-appearing.     Comments: She seems confused at times.  Here with her husband.  HENT:     Head: Normocephalic and atraumatic.  Eyes:     Extraocular Movements: Extraocular movements intact.     Conjunctiva/sclera: Conjunctivae normal.     Pupils: Pupils are equal, round, and reactive to light.  Cardiovascular:     Rate and Rhythm: Normal rate and regular rhythm.     Pulses:  Normal pulses.     Heart sounds: Normal heart sounds. No murmur heard. No friction rub. No gallop.   Pulmonary:     Effort: Pulmonary effort is normal. No respiratory distress.     Breath sounds: Normal breath sounds. No stridor. No wheezing, rhonchi or rales.  Abdominal:     General: Bowel sounds are normal. There is no distension.     Palpations: Abdomen is soft. There is no hepatomegaly, splenomegaly or mass.     Tenderness: There is no abdominal tenderness. There is no right CVA tenderness, guarding or rebound.  Musculoskeletal:     Cervical back: Normal range of motion and neck supple. No rigidity or tenderness.  Skin:    Capillary Refill: Capillary refill takes less than 2 seconds.  Neurological:     General: No focal deficit present.     Mental Status: She is alert and oriented to person, place, and time.      UC Treatments / Results  Labs (all labs ordered are listed, but only abnormal results are displayed) Labs Reviewed - No data to display  EKG   Radiology No results found.  Procedures Procedures (including critical care time)  Medications Ordered in UC Medications - No data to display  Initial Impression / Assessment and Plan / UC Course  I have reviewed the triage vital signs and the nursing notes.  Pertinent labs & imaging results that were available during my care of the patient were reviewed by me and considered in my medical decision making (see chart for details).  Clinical impression: 80 year old female with chronic nausea.  Complicating this is that she is a poor historian and is confused at times.  It also appears as though she has chronic constipation.  Her vital signs and physical exam are very reassuring.  Treatment plan: 1.  Findings and treatment plan were discussed in detail with the patient.  Patient was in agreement. 2.  Despite it being 7 PM on a Saturday night, when I left the room to finish the note and get her paperwork together she  came out of the room and indicated that she needed to leave to go to a funeral.  There is some concern about her confusion and I have encouraged her and her husband to get into see Dr. Dorthula Perfect to address this. 3.  Although she claims to have seen 5 or 6 doctors for this nausea I cannot find anything in a note to justify that.  That said, I will give her a small trial of Zofran.  Gave her 4 mg tablets and told her to take half a tablet per day as needed and only gave her 6. 4.  Educational handout was provided. 5.  I encouraged her to seek out the care of the ER if she had any significant symptoms or call 911.  She voiced verbal understanding. 6.  Discharge from care at this time.    Final Clinical Impressions(s) / UC Diagnoses   Final diagnoses:  Nausea  Constipation, unspecified constipation type  Confusion     Discharge Instructions     I have prescribed a medication for your nausea.  This is done a trial medication to see if it helps. I would encourage you to follow-up with your primary care physician for this chronic condition.  I hope you get to feeling better, Dr. Drema Dallas    ED Prescriptions    Medication Sig Dispense Auth. Provider   ondansetron (ZOFRAN) 4 MG tablet Take 1 tablet daily as needed for nausea.  Not to exceed 1 tablet a day. 6 tablet Verda Cumins, MD     PDMP not reviewed this encounter.   Verda Cumins, MD 03/16/20 1254

## 2020-03-14 NOTE — ED Triage Notes (Signed)
Patient nausea and dizziness that started today.  Patient c/o bilateral leg pain that started a year ago and reports that it has gotten worse.

## 2020-03-17 ENCOUNTER — Encounter: Payer: Medicare Other | Admitting: Physical Therapy

## 2020-03-19 ENCOUNTER — Encounter: Payer: Medicare Other | Admitting: Physical Therapy

## 2020-03-21 ENCOUNTER — Emergency Department
Admission: EM | Admit: 2020-03-21 | Discharge: 2020-03-21 | Disposition: A | Discharge status: Home / Self Care | Payer: Medicare Other | Attending: Emergency Medicine | Admitting: Emergency Medicine

## 2020-03-21 ENCOUNTER — Emergency Department: Payer: Medicare Other

## 2020-03-21 ENCOUNTER — Other Ambulatory Visit: Payer: Self-pay

## 2020-03-21 DIAGNOSIS — I129 Hypertensive chronic kidney disease with stage 1 through stage 4 chronic kidney disease, or unspecified chronic kidney disease: Secondary | ICD-10-CM | POA: Diagnosis not present

## 2020-03-21 DIAGNOSIS — S7001XA Contusion of right hip, initial encounter: Secondary | ICD-10-CM | POA: Insufficient documentation

## 2020-03-21 DIAGNOSIS — Y92009 Unspecified place in unspecified non-institutional (private) residence as the place of occurrence of the external cause: Secondary | ICD-10-CM | POA: Insufficient documentation

## 2020-03-21 DIAGNOSIS — W06XXXA Fall from bed, initial encounter: Secondary | ICD-10-CM | POA: Insufficient documentation

## 2020-03-21 DIAGNOSIS — N183 Chronic kidney disease, stage 3 unspecified: Secondary | ICD-10-CM | POA: Insufficient documentation

## 2020-03-21 DIAGNOSIS — S7011XA Contusion of right thigh, initial encounter: Secondary | ICD-10-CM | POA: Diagnosis not present

## 2020-03-21 DIAGNOSIS — S79911A Unspecified injury of right hip, initial encounter: Secondary | ICD-10-CM | POA: Diagnosis present

## 2020-03-21 DIAGNOSIS — W19XXXA Unspecified fall, initial encounter: Secondary | ICD-10-CM

## 2020-03-21 DIAGNOSIS — Z87891 Personal history of nicotine dependence: Secondary | ICD-10-CM | POA: Insufficient documentation

## 2020-03-21 DIAGNOSIS — R102 Pelvic and perineal pain: Secondary | ICD-10-CM | POA: Diagnosis not present

## 2020-03-21 DIAGNOSIS — Z85118 Personal history of other malignant neoplasm of bronchus and lung: Secondary | ICD-10-CM | POA: Insufficient documentation

## 2020-03-21 DIAGNOSIS — R52 Pain, unspecified: Secondary | ICD-10-CM

## 2020-03-21 MED ORDER — TRAMADOL HCL 50 MG PO TABS
50.0000 mg | ORAL_TABLET | Freq: Once | ORAL | Status: AC
  Administered 2020-03-21: 50 mg via ORAL
  Filled 2020-03-21: qty 1

## 2020-03-21 NOTE — ED Triage Notes (Signed)
Pt to ED via POV with c/o R leg pain. Pt states feels like she can't walk too good. Pt states pain started a little bit yesterday and worse today. Pt states hx of bursitis and has not had her shots.    Pt states pain is in her R thigh.

## 2020-03-21 NOTE — ED Notes (Signed)
Pt repositioned

## 2020-03-21 NOTE — ED Notes (Signed)
Pt given blanket and pillow and repositioned.

## 2020-03-21 NOTE — ED Notes (Signed)
Pt with xray 

## 2020-03-21 NOTE — Discharge Instructions (Addendum)
Follow-up with your primary care provider if any continued problems.  A tramadol was given to you while in the ED.  You may continue to take these at home with what you had at home and call your doctor if a another prescription is needed.  You may use ice to your hip as needed for discomfort.  Use your walker anytime you are up walking for additional support.  Your CT scan confirmed that you do not have a fracture in your pelvis or hips.  When taking your pain medication be careful that you are not drowsy as it may increase her chances for falling.  Return to the emergency department if any severe worsening of your symptoms or urgent concerns.

## 2020-03-21 NOTE — ED Notes (Signed)
Pt c/o soreness in right hip but is able to ambulate independently with walker.

## 2020-03-21 NOTE — ED Notes (Addendum)
Pt c/o right hip pain radiating into leg. Pt states it started hurting yestereday, husband reports she's been c/o pain for awhile. Pt is able to bear weight on leg with assistance of 3 staff. No swelling or deformity noted. Pt denies injury.

## 2020-03-21 NOTE — ED Provider Notes (Signed)
[facility] Emergency Department Provider Note  ____________________________________________   Event Date/Time   First MD Initiated Contact with Patient 03/21/20 1452     (approximate)  I have reviewed the triage vital signs and the nursing notes.   HISTORY  Chief Complaint Leg Pain   HPI Christy [name] is a 80 y.o. female presents to the ED with complaint of right hip and thigh pain.  Patient reports that she fell 2 nights ago while getting into bed.  Patient states that she has neuropathy in her lower extremities and sometimes does not get in the bed completely.  Husband states that yesterday morning when she awoke she had pain in her right hip area but continued to ambulate around the house without any assistance.  Patient denies any injury to her head or LOC.  Husband is present states that patient has ambulated very little due to "it hurts".  Patient has been seen in the past for bursitis in her hip.  Patient rates her pain as a 3/10.       Past Medical History:  Diagnosis Date  . Allergy   . Anxiety   . Depression   . Fatty liver disease, nonalcoholic   . GERD (gastroesophageal reflux disease)   . Heart disease   . Hypertension   . Lung cancer ([place]) 02/2014   RUL Lobectomy  . Urinary incontinence     Patient Active Problem List   Diagnosis Date Noted  . Ataxia 07/24/2019  . Confusion 07/24/2019  . Sepsis ([place]) 05/22/2019  . Sepsis secondary to UTI ([place]) 05/22/2019  . Hypotension 05/22/2019  . Acute kidney injury superimposed on CKD ([place]) 05/22/2019  . Compression fracture of body of thoracic vertebra ([place]) 05/04/2019  . Compression fracture of C-spine ([place]) 05/04/2019  . Fall 05/03/2019  . Anxiety, generalized 03/19/2019  . Exertional shortness of breath 10/03/2018  . Heart palpitations 10/02/2018  . Mild cognitive impairment 03/28/2018  . Aortic atherosclerosis ([place]) 02/24/2018  . 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  Cystocele, midline 04/28/2017  . Recurrent major depressive disorder, in partial remission ([place]) 03/17/2017  . Neuropathy 03/01/2017  . Lumbosacral radiculopathy 02/16/2017  . Bilateral pain of leg and foot 12/16/2016  . Fatigue 12/16/2016  . Trochanteric bursitis of both hips 12/16/2016  . Weakness of both legs 12/16/2016  . Stress incontinence of urine 10/15/2014  . Polypharmacy 09/02/2014  . Primary cancer of right upper lobe of lung ([place]) 07/19/2014  . Acid reflux 07/18/2014  . 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 Chronic kidney disease (CKD), stage III (moderate) ([place]) 08/30/2013  . Benign essential HTN 08/30/2013  . Fatty liver disease, nonalcoholic 27/07/2374  . Fibrositis 08/30/2013  . Hot flash, menopausal 08/30/2013  . Billowing mitral valve 08/30/2013  . Allergic rhinitis, seasonal 08/30/2013  . Fibromyalgia 08/30/2013    Past Surgical History:  Procedure Laterality Date  . ABDOMINAL HYSTERECTOMY    . KYPHOPLASTY N/A 05/04/2019   Procedure: KYPHOPLASTY T7;  Surgeon: Hessie Knows, MD;  Location: [facility] ORS;  Service: Orthopedics;  Laterality: N/A;  . [place]  . LARYNGOSCOPY N/A 07/22/2014   Procedure: JET LARYNGOSCOPY with right vocal cord biopsy;  Surgeon: Margaretha Sheffield, MD;  Location: [facility] ORS;  Service: ENT;  Laterality: N/A;  . LUNG CANCER SURGERY    . NECK SURGERY      Prior to Admission medications   Medication Sig Start Date End Date Taking? Authorizing Provider  benazepril (LOTENSIN) 5  MG tablet Take 1 tablet by mouth daily. 03/04/14   [provider]  calcium citrate-vitamin D 500-400 MG-UNIT chewable tablet Chew 1 tablet by mouth daily.     [provider]  DULoxetine (CYMBALTA) 20 MG capsule 20 mg nightly. Increase to 40 mg after 1 week, then to 60 mg after an additional week. 02/04/20   Coral Spikes, DO  estradiol (ESTRACE) 1 MG tablet Take 1 mg by mouth daily.  04/29/17    [provider]  Multiple Vitamins-Minerals (OCUVITE ADULT 50+ PO) Take 1 tablet by mouth daily.     [provider]  omeprazole (PRILOSEC) 40 MG capsule Take 40 mg by mouth daily.     [provider]  ondansetron (ZOFRAN) 4 MG tablet Take 1 tablet daily as needed for nausea.  Not to exceed 1 tablet a day. 03/14/20   Verda Cumins, MD  vitamin B-12 (CYANOCOBALAMIN) 1000 MCG tablet Take 1,000 mcg by mouth daily.     [provider]  nortriptyline (PAMELOR) 10 MG capsule Take 20 mg by mouth at bedtime.  03/21/19 02/04/20  [provider]    Allergies Alendronate, Nitrofurantoin, Sulfa antibiotics, Ciprofloxacin, and Diphenhydramine  Family History  Problem Relation Age of Onset  . Kidney cancer Cousin   . Cancer Maternal Grandfather   . Cancer Maternal Grandmother   . Kidney disease Neg Hx   . Prostate cancer Neg Hx     Social History Social History   Tobacco Use  . Smoking status: Former Smoker    Packs/day: 0.50    Years: 25.00    Pack years: 12.50    Types: Cigarettes    Quit date: 07/18/2006    Years since quitting: 13.6  . Smokeless tobacco: Never Used  Vaping Use  . Vaping Use: Never used  Substance Use Topics  . Alcohol use: Yes    Alcohol/week: 2.0 standard drinks    Types: 1 Glasses of wine, 1 Cans of beer per week    Comment: occassionally about 4 times a year  . Drug use: No    Review of Systems Constitutional: No fever/chills Eyes: No visual changes. ENT: No trauma. Cardiovascular: Denies chest pain. Respiratory: Denies shortness of breath. Gastrointestinal: No abdominal pain.  No nausea, no vomiting.  No diarrhea.  Genitourinary: Negative for dysuria. Musculoskeletal: Positive for right hip pain and right thigh pain. Skin: Negative for abrasion. Neurological: Negative for headaches, focal weakness or numbness.  ____________________________________________   PHYSICAL EXAM:  VITAL SIGNS: ED Triage Vitals   Enc Vitals Group     BP 03/21/20 1423 134/77     Pulse Rate 03/21/20 1423 77     Resp 03/21/20 1423 18     Temp 03/21/20 1423 98.3 F (36.8 C)     Temp Source 03/21/20 1423 Oral     SpO2 03/21/20 1423 94 %     Weight 03/21/20 1423 162 lb 14.7 oz (73.9 kg)     Height 03/21/20 1423 5\' 5"  (1.651 m)     Head Circumference --      Peak Flow --      Pain Score 03/21/20 1422 3     Pain Loc --      Pain Edu? --      Excl. in [place]? --     Constitutional: Alert and oriented. Well appearing and in no acute distress.  Patient is talkative and able to answer questions appropriately. Eyes: Conjunctivae are normal.  Head: Atraumatic. Nose: No trauma. Neck:  No stridor.  No cervical tenderness on palpation posteriorly.  Range of motion is that restriction. Cardiovascular: Normal rate, regular rhythm. Grossly normal heart sounds.  Good peripheral circulation. Respiratory: Normal respiratory effort.  No retractions. Lungs CTAB.  Nontender ribs to palpation. Gastrointestinal: Soft and nontender. No distention. Musculoskeletal: Nontender thoracic or lumbar spine to palpation.  Patient is able to move upper extremities without any difficulty.  Patient has tenderness on palpation of the right hip laterally.  No gross deformity, shortening or rotation is noted of the right lower extremity.  Has minimal tenderness and discomfort on abduction however increased discomfort with abduction of the hip.  Patient is able to bend her knee without difficulty and no effusion was noted. Neurologic:  Normal speech and language. No gross focal neurologic deficits are appreciated.  Skin:  Skin is warm, dry and intact.  No ecchymosis or abrasions were noted over the right hip or right lower extremity. Psychiatric: Mood and affect are normal. Speech and behavior are normal.  ____________________________________________   LABS (all labs ordered are listed, but only abnormal results are displayed)  Labs Reviewed - No data  to display  RADIOLOGY I, Johnn Hai, personally viewed and evaluated these images (plain radiographs) as part of my medical decision making, as well as reviewing the written report by the radiologist.   Official radiology report(s): CT PELVIS WO CONTRAST  Result Date: 03/21/2020 CLINICAL DATA:  Pelvic pain beginning yesterday worsening today. History of bursitis. Also with right leg pain. Clinical concern for stress fracture. EXAM: CT PELVIS WITHOUT CONTRAST TECHNIQUE: Multidetector CT imaging of the pelvis was performed following the standard protocol without intravenous contrast. COMPARISON:  03/18/2012. FINDINGS: Musculoskeletal: No acute fracture or bone lesion. Irregularity of the coccyx, which is consistent with an old, healed fracture. Hip joints, SI joints and symphysis pubis are normally spaced and aligned. Surrounding muscular structures are unremarkable. No hip joint effusion or evidence of bursitis. Urinary Tract: Normal bladder. Visualized ureters are unremarkable. Bowel: Visualized bowel shows no dilation, wall thickening or inflammation. Vascular/Lymphatic: Scattered distal aortic and iliac artery atherosclerotic calcifications. No aneurysm. No enlarged lymph nodes. Reproductive: Status post hysterectomy. Pessary lies in the upper vagina. No adnexal masses. Other:  No soft tissue masses or inflammation. IMPRESSION: 1. No acute fracture or bone lesion. 2. No CT findings of bursitis. No hip joint effusion or significant hip joint arthropathic change. No findings to account for the patient's symptoms. Electronically Signed   By: Lajean Manes M.D.   On: 03/21/2020 18:18   DG HIP UNILAT WITH PELVIS 2-3 VIEWS RIGHT  Result Date: 03/21/2020 CLINICAL DATA:  Pt c/o right hip pain radiating into leg. Pt states it started hurting yesterday, husband reports she's been c/o pain for awhile. Pt is able to bear weight on leg. No swelling or deformity noted. Pt denies injury. EXAM: DG HIP (WITH OR  WITHOUT PELVIS) 2-3V RIGHT COMPARISON:  None. FINDINGS: No fracture or bone lesion. Hip joints, SI joints and symphysis pubis are normally spaced and aligned. Soft tissues are unremarkable. IMPRESSION: Negative. Electronically Signed   By: Lajean Manes M.D.   On: 03/21/2020 15:34   DG Femur Min 2 Views Right  Result Date: 03/21/2020 CLINICAL DATA:  Pain x 2 days status post fall EXAM: RIGHT FEMUR 2 VIEWS COMPARISON:  None. FINDINGS: No acute fracture or dislocation. No aggressive osseous lesion. Normal alignment. Generalized osteopenia. Soft tissue are unremarkable. No radiopaque foreign body or soft tissue emphysema. Peripheral vascular atherosclerotic disease. IMPRESSION: No  acute osseous injury of the right femur. Given the patient's age and osteopenia, if there is persistent clinical concern for an occult hip fracture, a MRI of the hip is recommended for increased sensitivity. Electronically Signed   By: Kathreen Devoid   On: 03/21/2020 16:58    ____________________________________________   PROCEDURES  Procedure(s) performed (including Critical Care):  Procedures   ____________________________________________   INITIAL IMPRESSION / ASSESSMENT AND PLAN / ED COURSE  As part of my medical decision making, I reviewed the following data within the electronic MEDICAL RECORD NUMBER Notes from prior ED visits and [place] Controlled Substance Database  80 year old female presents to the ED after she fell 2 days ago and has continued to have right hip pain when she is walking.  Patient fell getting into her bed 2 nights ago.  Husband states that she was ambulatory the next morning but complained of discomfort while walking.  Patient has been using her walker for support with ambulation in the home.  This morning she had increased pain with weightbearing.  X-rays of her right hip and pelvis were negative for fracture.  A dedicated femur film was ordered and also negative.  Patient was able to bear weight while  in the ED and ambulation was minimal.  A CT confirmed that patient does not have a fracture to her hip or pelvis and both patient and husband were reassured.  Patient states that she has a prescription for tramadol at home that she takes for pain however since she fell she has not taken anything for pain.  Tramadol was given to her while in the ED and she was strongly encouraged to use her walker anytime she is up walking.  She is aware that the tramadol could cause drowsiness and increase her risk for falling and that it is necessary for her to use her walker for weightbearing at this time.  She will follow-up with her PCP if any continued problems.  She is encouraged to return to the emergency department if any severe worsening of her symptoms or urgent concerns.  ____________________________________________   FINAL CLINICAL IMPRESSION(S) / ED DIAGNOSES  Final diagnoses:  Pain  Contusion of right hip and thigh, initial encounter  Fall at home, initial encounter     ED Discharge Orders    None      *Please note:  Christy [name] was evaluated in Emergency Department on 03/21/2020 for the symptoms described in the history of present illness. She was evaluated in the context of the global COVID-19 pandemic, which necessitated consideration that the patient might be at risk for infection with the SARS-CoV-2 virus that causes COVID-19. Institutional protocols and algorithms that pertain to the evaluation of patients at risk for COVID-19 are in a state of rapid change based on information released by regulatory bodies including the CDC and federal and state organizations. These policies and algorithms were followed during the patient's care in the ED.  Some ED evaluations and interventions may be delayed as a result of limited staffing during and the pandemic.*   Note:  This document was prepared using Dragon voice recognition software and may include unintentional dictation errors.     Johnn Hai, PA-C 03/21/20 1840    Merlyn Lot, MD 03/21/20 1924

## 2020-03-21 NOTE — ED Notes (Signed)
Pt back from x-ray.

## 2020-03-24 ENCOUNTER — Encounter: Payer: Self-pay | Admitting: Intensive Care

## 2020-03-24 ENCOUNTER — Emergency Department: Payer: Medicare Other

## 2020-03-24 ENCOUNTER — Other Ambulatory Visit: Payer: Self-pay

## 2020-03-24 ENCOUNTER — Emergency Department
Admission: EM | Admit: 2020-03-24 | Discharge: 2020-03-24 | Disposition: A | Discharge status: Home / Self Care | Payer: Medicare Other | Attending: Emergency Medicine | Admitting: Emergency Medicine

## 2020-03-24 DIAGNOSIS — Z79899 Other long term (current) drug therapy: Secondary | ICD-10-CM | POA: Insufficient documentation

## 2020-03-24 DIAGNOSIS — I129 Hypertensive chronic kidney disease with stage 1 through stage 4 chronic kidney disease, or unspecified chronic kidney disease: Secondary | ICD-10-CM | POA: Insufficient documentation

## 2020-03-24 DIAGNOSIS — Z87891 Personal history of nicotine dependence: Secondary | ICD-10-CM | POA: Diagnosis not present

## 2020-03-24 DIAGNOSIS — Z85118 Personal history of other malignant neoplasm of bronchus and lung: Secondary | ICD-10-CM | POA: Insufficient documentation

## 2020-03-24 DIAGNOSIS — K5900 Constipation, unspecified: Secondary | ICD-10-CM | POA: Diagnosis not present

## 2020-03-24 DIAGNOSIS — N183 Chronic kidney disease, stage 3 unspecified: Secondary | ICD-10-CM | POA: Diagnosis not present

## 2020-03-24 DIAGNOSIS — N3000 Acute cystitis without hematuria: Secondary | ICD-10-CM | POA: Diagnosis not present

## 2020-03-24 LAB — URINALYSIS, COMPLETE (UACMP) WITH MICROSCOPIC
Bilirubin Urine: NEGATIVE
Glucose, UA: NEGATIVE mg/dL
Hgb urine dipstick: NEGATIVE
Ketones, ur: NEGATIVE mg/dL
Nitrite: NEGATIVE
Protein, ur: NEGATIVE mg/dL
Specific Gravity, Urine: 1.006 (ref 1.005–1.030)
WBC, UA: >50 WBC/hpf — ABNORMAL HIGH (ref 0–5)
pH: 6 (ref 5.0–8.0)

## 2020-03-24 LAB — COMPREHENSIVE METABOLIC PANEL
ALT: 15 U/L (ref 0–44)
AST: 19 U/L (ref 15–41)
Albumin: 3.5 g/dL (ref 3.5–5.0)
Alkaline Phosphatase: 64 U/L (ref 38–126)
Anion gap: 10 (ref 5–15)
BUN: 17 mg/dL (ref 8–23)
CO2: 28 mmol/L (ref 22–32)
Calcium: 9.7 mg/dL (ref 8.9–10.3)
Chloride: 101 mmol/L (ref 98–111)
Creatinine, Ser: 0.96 mg/dL (ref 0.44–1.00)
GFR, Estimated: 60 mL/min — ABNORMAL LOW (ref >60)
Glucose, Bld: 121 mg/dL — ABNORMAL HIGH (ref 70–99)
Potassium: 4.3 mmol/L (ref 3.5–5.1)
Sodium: 139 mmol/L (ref 135–145)
Total Bilirubin: 0.5 mg/dL (ref 0.3–1.2)
Total Protein: 7.2 g/dL (ref 6.5–8.1)

## 2020-03-24 LAB — LIPASE, BLOOD: Lipase: 32 U/L (ref 11–51)

## 2020-03-24 LAB — CBC WITH DIFFERENTIAL/PLATELET
Abs Immature Granulocytes: 0.03 10*3/uL (ref 0.00–0.07)
Basophils Absolute: 0.1 10*3/uL (ref 0.0–0.1)
Basophils Relative: 1 %
Eosinophils Absolute: 0.1 10*3/uL (ref 0.0–0.5)
Eosinophils Relative: 1 %
HCT: 39.5 % (ref 36.0–46.0)
Hemoglobin: 12.8 g/dL (ref 12.0–15.0)
Immature Granulocytes: 0 %
Lymphocytes Relative: 14 %
Lymphs Abs: 1.2 10*3/uL (ref 0.7–4.0)
MCH: 30.8 pg (ref 26.0–34.0)
MCHC: 32.4 g/dL (ref 30.0–36.0)
MCV: 95 fL (ref 80.0–100.0)
Monocytes Absolute: 0.9 10*3/uL (ref 0.1–1.0)
Monocytes Relative: 10 %
Neutro Abs: 6.5 10*3/uL (ref 1.7–7.7)
Neutrophils Relative %: 74 %
Platelets: 247 10*3/uL (ref 150–400)
RBC: 4.16 MIL/uL (ref 3.87–5.11)
RDW: 13.6 % (ref 11.5–15.5)
WBC: 8.9 10*3/uL (ref 4.0–10.5)
nRBC: 0 % (ref 0.0–0.2)

## 2020-03-24 LAB — TROPONIN I (HIGH SENSITIVITY): Troponin I (High Sensitivity): 3 ng/L (ref <18)

## 2020-03-24 MED ORDER — LIDOCAINE 5 % EX PTCH
1.0000 | MEDICATED_PATCH | CUTANEOUS | Status: DC
  Administered 2020-03-24: 1 via TRANSDERMAL
  Filled 2020-03-24: qty 1

## 2020-03-24 MED ORDER — CEPHALEXIN 500 MG PO CAPS: 500.0000 mg | ORAL_CAPSULE | Freq: Four times a day (QID) | ORAL | 0 refills | Status: AC

## 2020-03-24 NOTE — ED Triage Notes (Signed)
First Nurse Note:  Arrives via EMS with c/o constipation.  No BM x 1 week.  Has taken miralax at home without result.  VS wnl.

## 2020-03-24 NOTE — ED Provider Notes (Signed)
[facility] Emergency Department Provider Note  ____________________________________________   Event Date/Time   First MD Initiated Contact with Patient 03/24/20 1747     (approximate)  I have reviewed the triage vital signs and the nursing notes.   HISTORY  Chief Complaint Constipation   HPI Christy [name] is a 80 y.o. female with a past medical history of depression, HTN, lung cancer status post right upper lobe lobectomy, anxiety, fibromyalgia, and prior episodes of constipation who presents for assessment of approximately 1 week of constipation and decreased urine output today.  Just endorses a very minimal abdominal discomfort she describes as bloating not pain.  She denies any headache, earache, sore throat, chest pain, cough, shortness of breath, vomiting, pain with urination, back pain, rash extremity pain.  She states she took MiraLAX earlier in the week but this did not seem to help.  She states she has had constipation in the past and usually last couple of days but this seems to be slightly worse than usual.  No other acute concerns at this time.          Past Medical History:  Diagnosis Date  . Allergy   . Anxiety   . Depression   . Fatty liver disease, nonalcoholic   . GERD (gastroesophageal reflux disease)   . Heart disease   . Hypertension   . Lung cancer ([place]) 02/2014   RUL Lobectomy  . Urinary incontinence     Patient Active Problem List   Diagnosis Date Noted  . Ataxia 07/24/2019  . Confusion 07/24/2019  . Sepsis ([place]) 05/22/2019  . Sepsis secondary to UTI ([place]) 05/22/2019  . Hypotension 05/22/2019  . Acute kidney injury superimposed on CKD ([place]) 05/22/2019  . Compression fracture of body of thoracic vertebra ([place]) 05/04/2019  . Compression fracture of C-spine ([place]) 05/04/2019  . Fall 05/03/2019  . Anxiety, generalized 03/19/2019  . Exertional shortness of breath 10/03/2018  . Heart palpitations 10/02/2018  . Mild  cognitive impairment 03/28/2018  . Aortic atherosclerosis ([place]) 02/24/2018  . 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 Cystocele, midline 04/28/2017  . Recurrent major depressive disorder, in partial remission ([place]) 03/17/2017  . Neuropathy 03/01/2017  . Lumbosacral radiculopathy 02/16/2017  . Bilateral pain of leg and foot 12/16/2016  . Fatigue 12/16/2016  . Trochanteric bursitis of both hips 12/16/2016  . Weakness of both legs 12/16/2016  . Stress incontinence of urine 10/15/2014  . Polypharmacy 09/02/2014  . Primary cancer of right upper lobe of lung ([place]) 07/19/2014  . Acid reflux 07/18/2014  . 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 Chronic kidney disease (CKD), stage III (moderate) ([place]) 08/30/2013  . Benign essential HTN 08/30/2013  . Fatty liver disease, nonalcoholic 23/76/2831  . Fibrositis 08/30/2013  . Hot flash, menopausal 08/30/2013  . Billowing mitral valve 08/30/2013  . Allergic rhinitis, seasonal 08/30/2013  . Fibromyalgia 08/30/2013    Past Surgical History:  Procedure Laterality Date  . ABDOMINAL HYSTERECTOMY    . KYPHOPLASTY N/A 05/04/2019   Procedure: KYPHOPLASTY T7;  Surgeon: Hessie Knows, MD;  Location: [facility] ORS;  Service: Orthopedics;  Laterality: N/A;  . [place]  . LARYNGOSCOPY N/A 07/22/2014   Procedure: JET LARYNGOSCOPY with right vocal cord biopsy;  Surgeon: Margaretha Sheffield, MD;  Location: [facility] ORS;  Service: ENT;  Laterality: N/A;  . LUNG CANCER SURGERY    . NECK SURGERY      Prior to Admission medications   Medication Sig Start Date  End Date Taking? Authorizing Provider  cephALEXin (KEFLEX) 500 MG capsule Take 1 capsule (500 mg total) by mouth 4 (four) times daily for 7 days. 03/24/20 03/31/20 Yes Lucrezia Starch, MD  benazepril (LOTENSIN) 5 MG tablet Take 1 tablet by mouth daily. 03/04/14   [provider]  calcium citrate-vitamin D 500-400 MG-UNIT chewable tablet Chew 1 tablet by  mouth daily.     [provider]  DULoxetine (CYMBALTA) 20 MG capsule 20 mg nightly. Increase to 40 mg after 1 week, then to 60 mg after an additional week. 02/04/20   Coral Spikes, DO  estradiol (ESTRACE) 1 MG tablet Take 1 mg by mouth daily.  04/29/17   [provider]  Multiple Vitamins-Minerals (OCUVITE ADULT 50+ PO) Take 1 tablet by mouth daily.     [provider]  omeprazole (PRILOSEC) 40 MG capsule Take 40 mg by mouth daily.     [provider]  ondansetron (ZOFRAN) 4 MG tablet Take 1 tablet daily as needed for nausea.  Not to exceed 1 tablet a day. 03/14/20   Verda Cumins, MD  vitamin B-12 (CYANOCOBALAMIN) 1000 MCG tablet Take 1,000 mcg by mouth daily.     [provider]  nortriptyline (PAMELOR) 10 MG capsule Take 20 mg by mouth at bedtime.  03/21/19 02/04/20  [provider]    Allergies Alendronate, Nitrofurantoin, Sulfa antibiotics, Ciprofloxacin, and Diphenhydramine  Family History  Problem Relation Age of Onset  . Kidney cancer Cousin   . Cancer Maternal Grandfather   . Cancer Maternal Grandmother   . Kidney disease Neg Hx   . Prostate cancer Neg Hx     Social History Social History   Tobacco Use  . Smoking status: Former Smoker    Packs/day: 0.50    Years: 25.00    Pack years: 12.50    Types: Cigarettes    Quit date: 07/18/2006    Years since quitting: 13.6  . Smokeless tobacco: Never Used  Vaping Use  . Vaping Use: Never used  Substance Use Topics  . Alcohol use: Yes    Alcohol/week: 2.0 standard drinks    Types: 1 Glasses of wine, 1 Cans of beer per week    Comment: occassionally about 4 times a year  . Drug use: No    Review of Systems  Review of Systems  Constitutional: Negative for chills and fever.  HENT: Negative for sore throat.   Eyes: Negative for pain.  Respiratory: Negative for cough and stridor.   Cardiovascular: Negative for chest pain.  Gastrointestinal: Positive for constipation.  Negative for abdominal pain ( bloating ) and vomiting.  Genitourinary: Negative for dysuria.  Musculoskeletal: Negative for myalgias.  Skin: Negative for rash.  Neurological: Negative for seizures, loss of consciousness and headaches.  Psychiatric/Behavioral: Negative for suicidal ideas.  All other systems reviewed and are negative.     ____________________________________________   PHYSICAL EXAM:  VITAL SIGNS: ED Triage Vitals [03/24/20 1541]  Enc Vitals Group     BP 136/78     Pulse Rate 94     Resp 16     Temp 97.7 F (36.5 C)     Temp Source Oral     SpO2 98 %     Weight 163 lb (73.9 kg)     Height 5\' 5"  (1.651 m)     Head Circumference      Peak Flow      Pain Score 0     Pain Loc  Pain Edu?      Excl. in [place]?    Vitals:   03/24/20 1541 03/24/20 1821  BP: 136/78 (!) 169/93  Pulse: 94 83  Resp: 16 18  Temp: 97.7 F (36.5 C)   SpO2: 98% 98%   Physical Exam Vitals and nursing note reviewed.  Constitutional:      General: She is not in acute distress.    Appearance: She is well-developed and well-nourished.  HENT:     Head: Normocephalic and atraumatic.     Right Ear: External ear normal.     Left Ear: External ear normal.     Nose: Nose normal.  Eyes:     Conjunctiva/sclera: Conjunctivae normal.  Cardiovascular:     Rate and Rhythm: Normal rate and regular rhythm.     Heart sounds: No murmur heard.   Pulmonary:     Effort: Pulmonary effort is normal. No respiratory distress.     Breath sounds: Normal breath sounds.  Abdominal:     Palpations: Abdomen is soft.     Tenderness: There is no abdominal tenderness.  Musculoskeletal:        General: No edema.     Cervical back: Neck supple.  Skin:    General: Skin is warm and dry.     Capillary Refill: Capillary refill takes less than 2 seconds.  Neurological:     Mental Status: She is alert and oriented to person, place, and time.  Psychiatric:        Mood and Affect: Mood and affect and mood  normal.      ____________________________________________   LABS (all labs ordered are listed, but only abnormal results are displayed)  Labs Reviewed  COMPREHENSIVE METABOLIC PANEL - Abnormal; Notable for the following components:      Result Value   Glucose, Bld 121 (*)    GFR, Estimated 60 (*)    All other components within normal limits  URINALYSIS, COMPLETE (UACMP) WITH MICROSCOPIC - Abnormal; Notable for the following components:   Color, Urine YELLOW (*)    APPearance CLOUDY (*)    Leukocytes,Ua LARGE (*)    WBC, UA >50 (*)    Bacteria, UA RARE (*)    All other components within normal limits  URINE CULTURE  CBC WITH DIFFERENTIAL/PLATELET  LIPASE, BLOOD  TROPONIN I (HIGH SENSITIVITY)  TROPONIN I (HIGH SENSITIVITY)   ____________________________________________  EKG  Sinus rhythm with ventricular rate of 82, normal axis, unremarkable's, no clear evidence of acute ischemia or other significant underlying arrhythmia ____________________________________________  RADIOLOGY  ED MD interpretation: Some evidence of constipation and aortic atherosclerosis without any other clear acute abdominal or pelvic pathology.  No evidence of diverticulitis, SBO, cryptitis, cholecystitis, colitis or any other clear acute process.  Official radiology report(s): CT ABDOMEN PELVIS WO CONTRAST  Result Date: 03/24/2020 CLINICAL DATA:  Constipation for 1 week. Difficulty urinating. History of lung cancer. Hysterectomy. EXAM: CT ABDOMEN AND PELVIS WITHOUT CONTRAST TECHNIQUE: Multidetector CT imaging of the abdomen and pelvis was performed following the standard protocol without IV contrast. COMPARISON:  03/21/2020 pelvic CT.  05/16/2017 abdominopelvic CT. FINDINGS: Lower chest: Emphysema. Right lower lobe pleural-based opacity including on 03/03 is decreased from 05/16/2017 and likely represents scarring. Right lower lobe nodularity, including on 09/03 is unchanged. Mild cardiomegaly, without  pericardial or pleural effusion. Hepatobiliary: Normal liver, gallbladder, biliary tract. Pancreas: Normal, without mass or ductal dilatation. Spleen: Normal in size, without focal abnormality. Adrenals/Urinary Tract: Normal adrenal glands. No renal calculi or hydronephrosis. No hydroureter  or ureteric calculi. No bladder calculi. Stomach/Bowel: Normal stomach, without wall thickening. Colonic stool burden suggests constipation. Normal terminal ileum and appendix. Normal small bowel. Vascular/Lymphatic: Aortic atherosclerosis. No abdominopelvic adenopathy. Reproductive: Pessary.  Hysterectomy.  No adnexal mass. Other: No significant free fluid.  Mild pelvic floor laxity. Musculoskeletal: Mild osteopenia.  No acute osseous abnormality. IMPRESSION: 1.  No acute process in the abdomen or pelvis. 2. Possible constipation. 3. Aortic Atherosclerosis (ICD10-I70.0) and Emphysema (ICD10-J43.9). Electronically Signed   By: Abigail Miyamoto M.D.   On: 03/24/2020 19:48    ____________________________________________   PROCEDURES  Procedure(s) performed (including Critical Care):  .1-3 Lead EKG Interpretation Performed by: Lucrezia Starch, MD Authorized by: Lucrezia Starch, MD     Interpretation: normal     ECG rate assessment: normal     Rhythm: sinus rhythm     Ectopy: none     Conduction: normal       ____________________________________________   INITIAL IMPRESSION / ASSESSMENT AND PLAN / ED COURSE      Patient presents with above to history exam for assessment of proximately 1 week of constipation associated with difficulty initiating urinary stream.  On arrival she is afebrile hemodynamically stable.  Although certainly possible patient has simple constipation additional differential considerations especially given difficulty with urinary stream and some bloating sensation possible atypical presentation of ACS, cystitis, pancreatitis, diverticulitis, kidney stone, cholecystitis and  ileus.  CT obtained does show evidence of constipation without stercoral colitis.  No evidence of SBO, diverticulitis, kidney stone, pyelonephritis, pancreatitis, cholecystitis or other clear acute intra-abdominal pelvic pathology.  No evidence of ileus.  CBC is unremarkable.  CMP has no significant electrolyte or metabolic derangements.  Troponin is nonelevated and given reassuring EKG low suspicion for atypical presentation of ACS.  Lipase not consistent with acute pancreatitis.  UA does appear consistent with cystitis although there are no historical or exam findings to suggest pyelonephritis at this time.  Suspect this may be contributing to patient's with the patient.  Rx written for Keflex.  Advised patient that she should continue taking MiraLAX but may add Senokot and glycerin suppository.  Advised her to follow-up with her PCP in 2 to 3 days and return immediately to the emergency room if she experiences any new or acute worsening of symptoms.  Patient voiced understanding and agreement with this plan.  She was given 1 lidocaine patch for some chronic neck pain that she has had for several years but she states got a little bothersome while she was lying for several hours in ED bed.  Discharge stable condition.  Strict term cautions advised and discussed.     ____________________________________________   FINAL CLINICAL IMPRESSION(S) / ED DIAGNOSES  Final diagnoses:  Constipation, unspecified constipation type  Acute cystitis without hematuria    Medications  lidocaine (LIDODERM) 5 % 1 patch (1 patch Transdermal Patch Applied 03/24/20 2013)     ED Discharge Orders         Ordered    cephALEXin (KEFLEX) 500 MG capsule  4 times daily        03/24/20 2016           Note:  This document was prepared using Dragon voice recognition software and may include unintentional dictation errors.   Lucrezia Starch, MD 03/24/20 2020

## 2020-03-24 NOTE — ED Triage Notes (Signed)
Patient c/o constipation X1 week. Taken miralax at home with no relief. Patient also reports she cannot pee since this AM. Denies pain

## 2020-03-26 ENCOUNTER — Encounter: Payer: Medicare Other | Admitting: Physical Therapy

## 2020-03-26 LAB — URINE CULTURE: Culture: >=100000 — AB

## 2020-03-28 ENCOUNTER — Encounter: Payer: Medicare Other | Admitting: Physical Therapy

## 2020-03-31 ENCOUNTER — Encounter: Payer: Medicare Other | Admitting: Physical Therapy

## 2020-04-02 ENCOUNTER — Encounter: Payer: Medicare Other | Admitting: Physical Therapy

## 2020-04-07 ENCOUNTER — Encounter: Payer: Medicare Other | Admitting: Physical Therapy

## 2020-04-09 ENCOUNTER — Encounter: Payer: Medicare Other | Admitting: Physical Therapy

## 2020-04-14 ENCOUNTER — Encounter: Payer: Medicare Other | Admitting: Physical Therapy

## 2020-04-16 ENCOUNTER — Encounter: Payer: Medicare Other | Admitting: Physical Therapy

## 2020-04-21 ENCOUNTER — Encounter: Payer: Medicare Other | Admitting: Physical Therapy

## 2020-04-23 ENCOUNTER — Encounter: Payer: Medicare Other | Admitting: Physical Therapy

## 2020-05-27 NOTE — Progress Notes (Signed)
Appointment cancelled

## 2020-06-06 ENCOUNTER — Encounter: Payer: Self-pay | Admitting: Obstetrics & Gynecology

## 2020-06-06 ENCOUNTER — Ambulatory Visit (INDEPENDENT_AMBULATORY_CARE_PROVIDER_SITE_OTHER): Payer: Medicare Other | Admitting: Obstetrics & Gynecology

## 2020-06-06 ENCOUNTER — Other Ambulatory Visit: Payer: Self-pay

## 2020-06-06 VITALS — BP 120/80 | Wt 150.0 lb

## 2020-06-06 DIAGNOSIS — N393 Stress incontinence (female) (male): Secondary | ICD-10-CM | POA: Diagnosis not present

## 2020-06-06 DIAGNOSIS — N8111 Cystocele, midline: Secondary | ICD-10-CM

## 2020-06-06 NOTE — Progress Notes (Signed)
  HPI:      [name] is a 80 y.o. No obstetric history on file. who presents today for her pessary follow up and examination related to her pelvic floor weakening.  Pt reports tolerating the pessary well with no vaginal bleeding and no vaginal discharge.  Symptoms of pelvic floor weakening have greatly improved. She is voiding and defecating without difficulty. She currently has a Research scientist (physical sciences) #5 pessary.  PMHx: She  has a past medical history of Allergy, Anxiety, Depression, Fatty liver disease, nonalcoholic, GERD (gastroesophageal reflux disease), Heart disease, Hypertension, Lung cancer ([place]) (02/2014), and Urinary incontinence. Also,  has a past surgical history that includes Laryngoscopy (N/A, 07/22/2014); Laparoscopic hysterectomy (1977); Lung cancer surgery; Abdominal hysterectomy; Kyphoplasty (N/A, 05/04/2019); and Neck surgery., family history includes Cancer in her maternal grandfather and maternal grandmother; Kidney cancer in her cousin.,  reports that she quit smoking about 13 years ago. Her smoking use included cigarettes. She has a 12.50 pack-year smoking history. She has never used smokeless tobacco. She reports current alcohol use of about 2.0 standard drinks of alcohol per week. She reports that she does not use drugs.  She has a current medication list which includes the following prescription(s): benazepril, calcium citrate-vitamin d, duloxetine, estradiol, multiple vitamins-minerals, omeprazole, ondansetron, vitamin b-12, and [DISCONTINUED] nortriptyline. Also, is allergic to alendronate, nitrofurantoin, sulfa antibiotics, ciprofloxacin, and diphenhydramine.  Review of Systems  All other systems reviewed and are negative.   Objective: BP 120/80   Wt 150 lb (68 kg)   BMI 24.96 kg/m  Physical Exam Constitutional:      General: She is not in acute distress.    Appearance: She is well-developed.  Genitourinary:     Genitourinary Comments: Cuff intact/ no  lesions  Absent uterus and cervix     No vaginal erythema or bleeding.  HENT:     Head: Normocephalic and atraumatic.     Nose: Nose normal.  Abdominal:     General: There is no distension.     Palpations: Abdomen is soft.     Tenderness: There is no abdominal tenderness.  Musculoskeletal:        General: Normal range of motion.  Neurological:     Mental Status: She is alert and oriented to person, place, and time.     Cranial Nerves: No cranial nerve deficit.  Skin:    General: Skin is warm and dry.  Psychiatric:        Attention and Perception: Attention normal.        Mood and Affect: Mood normal.        Speech: Speech normal.        Behavior: Behavior normal.        Cognition and Memory: Cognition normal.        Judgment: Judgment normal.     Pessary Care Pessary removed and cleaned.  Vagina checked - without erosions - pessary replaced.  A/P:   ICD-10-CM   1. 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 Stress incontinence of urine  N39.3    Pessary was cleaned and replaced today. Instructions given for care. Concerning symptoms to observe for are counseled to patient. Follow up scheduled for 3 months.  A total of 20 minutes were spent face-to-face with the patient as well as preparation, review, communication, and documentation during this encounter.   Barnett Applebaum, MD, [facility], [facility] 06/06/2020  2:46 PM

## 2020-07-12 ENCOUNTER — Inpatient Hospital Stay
Admission: EM | Admit: 2020-07-12 | Discharge: 2020-07-16 | DRG: 871 | Disposition: A | Discharge status: Skilled Nursing Facility | Payer: Medicare Other | Source: Skilled Nursing Facility | Attending: Student | Admitting: Student

## 2020-07-12 ENCOUNTER — Emergency Department: Payer: Medicare Other

## 2020-07-12 ENCOUNTER — Other Ambulatory Visit: Payer: Self-pay

## 2020-07-12 DIAGNOSIS — F32A Depression, unspecified: Secondary | ICD-10-CM | POA: Diagnosis present

## 2020-07-12 DIAGNOSIS — E876 Hypokalemia: Secondary | ICD-10-CM | POA: Diagnosis present

## 2020-07-12 DIAGNOSIS — Z8051 Family history of malignant neoplasm of kidney: Secondary | ICD-10-CM | POA: Diagnosis not present

## 2020-07-12 DIAGNOSIS — J154 Pneumonia due to other streptococci: Secondary | ICD-10-CM | POA: Diagnosis present

## 2020-07-12 DIAGNOSIS — F0390 Unspecified dementia without behavioral disturbance: Secondary | ICD-10-CM | POA: Diagnosis present

## 2020-07-12 DIAGNOSIS — A409 Streptococcal sepsis, unspecified: Secondary | ICD-10-CM | POA: Diagnosis present

## 2020-07-12 DIAGNOSIS — K219 Gastro-esophageal reflux disease without esophagitis: Secondary | ICD-10-CM | POA: Diagnosis present

## 2020-07-12 DIAGNOSIS — N390 Urinary tract infection, site not specified: Secondary | ICD-10-CM | POA: Diagnosis present

## 2020-07-12 DIAGNOSIS — I10 Essential (primary) hypertension: Secondary | ICD-10-CM | POA: Diagnosis present

## 2020-07-12 DIAGNOSIS — J189 Pneumonia, unspecified organism: Secondary | ICD-10-CM | POA: Diagnosis not present

## 2020-07-12 DIAGNOSIS — B370 Candidal stomatitis: Secondary | ICD-10-CM | POA: Diagnosis present

## 2020-07-12 DIAGNOSIS — R531 Weakness: Secondary | ICD-10-CM

## 2020-07-12 DIAGNOSIS — A419 Sepsis, unspecified organism: Secondary | ICD-10-CM | POA: Diagnosis present

## 2020-07-12 DIAGNOSIS — G309 Alzheimer's disease, unspecified: Secondary | ICD-10-CM

## 2020-07-12 DIAGNOSIS — M25572 Pain in left ankle and joints of left foot: Secondary | ICD-10-CM | POA: Diagnosis present

## 2020-07-12 DIAGNOSIS — K760 Fatty (change of) liver, not elsewhere classified: Secondary | ICD-10-CM | POA: Diagnosis present

## 2020-07-12 DIAGNOSIS — Z87891 Personal history of nicotine dependence: Secondary | ICD-10-CM | POA: Diagnosis not present

## 2020-07-12 DIAGNOSIS — Z20822 Contact with and (suspected) exposure to covid-19: Secondary | ICD-10-CM | POA: Diagnosis present

## 2020-07-12 DIAGNOSIS — Z902 Acquired absence of lung [part of]: Secondary | ICD-10-CM

## 2020-07-12 DIAGNOSIS — Z79899 Other long term (current) drug therapy: Secondary | ICD-10-CM

## 2020-07-12 DIAGNOSIS — I2510 Atherosclerotic heart disease of native coronary artery without angina pectoris: Secondary | ICD-10-CM | POA: Diagnosis present

## 2020-07-12 DIAGNOSIS — Z85118 Personal history of other malignant neoplasm of bronchus and lung: Secondary | ICD-10-CM

## 2020-07-12 DIAGNOSIS — F0280 Dementia in other diseases classified elsewhere without behavioral disturbance: Secondary | ICD-10-CM | POA: Diagnosis not present

## 2020-07-12 DIAGNOSIS — E739 Lactose intolerance, unspecified: Secondary | ICD-10-CM | POA: Diagnosis present

## 2020-07-12 DIAGNOSIS — F419 Anxiety disorder, unspecified: Secondary | ICD-10-CM | POA: Diagnosis present

## 2020-07-12 DIAGNOSIS — N8111 Cystocele, midline: Secondary | ICD-10-CM | POA: Diagnosis not present

## 2020-07-12 DIAGNOSIS — M542 Cervicalgia: Secondary | ICD-10-CM

## 2020-07-12 DIAGNOSIS — E538 Deficiency of other specified B group vitamins: Secondary | ICD-10-CM | POA: Diagnosis present

## 2020-07-12 DIAGNOSIS — J181 Lobar pneumonia, unspecified organism: Secondary | ICD-10-CM | POA: Diagnosis not present

## 2020-07-12 DIAGNOSIS — A408 Other streptococcal sepsis: Secondary | ICD-10-CM | POA: Diagnosis not present

## 2020-07-12 LAB — COMPREHENSIVE METABOLIC PANEL
ALT: 14 U/L (ref 0–44)
AST: 23 U/L (ref 15–41)
Albumin: 3.7 g/dL (ref 3.5–5.0)
Alkaline Phosphatase: 77 U/L (ref 38–126)
Anion gap: 10 (ref 5–15)
BUN: 15 mg/dL (ref 8–23)
CO2: 26 mmol/L (ref 22–32)
Calcium: 9.3 mg/dL (ref 8.9–10.3)
Chloride: 104 mmol/L (ref 98–111)
Creatinine, Ser: 0.83 mg/dL (ref 0.44–1.00)
GFR, Estimated: >60 mL/min (ref >60)
Glucose, Bld: 126 mg/dL — ABNORMAL HIGH (ref 70–99)
Potassium: 3.6 mmol/L (ref 3.5–5.1)
Sodium: 140 mmol/L (ref 135–145)
Total Bilirubin: 1.1 mg/dL (ref 0.3–1.2)
Total Protein: 7.3 g/dL (ref 6.5–8.1)

## 2020-07-12 LAB — CBC WITH DIFFERENTIAL/PLATELET
Abs Immature Granulocytes: 0.07 10*3/uL (ref 0.00–0.07)
Basophils Absolute: 0.1 10*3/uL (ref 0.0–0.1)
Basophils Relative: 1 %
Eosinophils Absolute: 0.1 10*3/uL (ref 0.0–0.5)
Eosinophils Relative: 1 %
HCT: 43.1 % (ref 36.0–46.0)
Hemoglobin: 13.9 g/dL (ref 12.0–15.0)
Immature Granulocytes: 0 %
Lymphocytes Relative: 5 %
Lymphs Abs: 0.8 10*3/uL (ref 0.7–4.0)
MCH: 29.9 pg (ref 26.0–34.0)
MCHC: 32.3 g/dL (ref 30.0–36.0)
MCV: 92.7 fL (ref 80.0–100.0)
Monocytes Absolute: 0.9 10*3/uL (ref 0.1–1.0)
Monocytes Relative: 5 %
Neutro Abs: 14.7 10*3/uL — ABNORMAL HIGH (ref 1.7–7.7)
Neutrophils Relative %: 88 %
Platelets: 230 10*3/uL (ref 150–400)
RBC: 4.65 MIL/uL (ref 3.87–5.11)
RDW: 13.1 % (ref 11.5–15.5)
WBC: 16.6 10*3/uL — ABNORMAL HIGH (ref 4.0–10.5)
nRBC: 0 % (ref 0.0–0.2)

## 2020-07-12 LAB — RESP PANEL BY RT-PCR (FLU A&B, COVID) ARPGX2
Influenza A by PCR: NEGATIVE
Influenza B by PCR: NEGATIVE
SARS Coronavirus 2 by RT PCR: NEGATIVE

## 2020-07-12 LAB — LACTIC ACID, PLASMA: Lactic Acid, Venous: 1.1 mmol/L (ref 0.5–1.9)

## 2020-07-12 MED ORDER — SODIUM CHLORIDE 0.9 % IV SOLN
1.0000 g | INTRAVENOUS | Status: DC
  Administered 2020-07-12: 1 g via INTRAVENOUS
  Filled 2020-07-12: qty 10

## 2020-07-12 MED ORDER — SODIUM CHLORIDE 0.9 % IV SOLN
500.0000 mg | Freq: Once | INTRAVENOUS | Status: AC
  Administered 2020-07-12: 500 mg via INTRAVENOUS

## 2020-07-12 MED ORDER — SODIUM CHLORIDE 0.9 % IV BOLUS (SEPSIS)
1000.0000 mL | Freq: Once | INTRAVENOUS | Status: AC
  Administered 2020-07-12: 1000 mL via INTRAVENOUS

## 2020-07-12 NOTE — ED Triage Notes (Signed)
Pt BIBA from [facility] for generalized weakness x couple days. Pt c/o pain all over which is chronic per pt. Facility states pt had a choking incident this am. EMS reports HR 120, Temp 102.0, 94% RA, 30 RR, BP 166/83.

## 2020-07-12 NOTE — Consult Note (Signed)
CODE SEPSIS - PHARMACY COMMUNICATION  **Broad Spectrum Antibiotics should be administered within 1 hour of Sepsis diagnosis**  Time Code Sepsis Called/Page Received: 2133  Antibiotics Ordered: ceftriaxone  Time of 1st antibiotic administration: 2216  Additional action taken by pharmacy: n/a  If necessary, Name of Provider/Nurse Contacted: n/a    Dorothe Pea ,PharmD Clinical Pharmacist  07/12/2020  10:18 PM

## 2020-07-12 NOTE — Sepsis Progress Note (Signed)
Following for Code Sepsis  

## 2020-07-12 NOTE — ED Provider Notes (Signed)
[facility] ([place] Emergency Department Provider Note  ____________________________________________  Time seen: Approximately 11:46 PM  I have reviewed the triage vital signs and the nursing notes.   HISTORY  Chief Complaint Weakness    HPI Christy [name] is a 80 y.o. female with a history of GERD hypertension and sepsis from prior UTI who comes the ED complaining of generalized weakness for the past couple of days, body aches, loss of appetite.  EMS reports temp of 102, heart rate of 120.  Patient reports urge to urinate and suprapubic pressure, denies other pain, shortness of breath, or cough.      Past Medical History:  Diagnosis Date  . Allergy   . Anxiety   . Depression   . Fatty liver disease, nonalcoholic   . GERD (gastroesophageal reflux disease)   . Heart disease   . Hypertension   . Lung cancer ([place]) 02/2014   RUL Lobectomy  . Urinary incontinence      Patient Active Problem List   Diagnosis Date Noted  . Ataxia 07/24/2019  . Confusion 07/24/2019  . Sepsis ([place]) 05/22/2019  . Sepsis secondary to UTI ([place]) 05/22/2019  . Hypotension 05/22/2019  . Acute kidney injury superimposed on CKD ([place]) 05/22/2019  . Compression fracture of body of thoracic vertebra ([place]) 05/04/2019  . Compression fracture of C-spine ([place]) 05/04/2019  . Fall 05/03/2019  . Anxiety, generalized 03/19/2019  . Exertional shortness of breath 10/03/2018  . Heart palpitations 10/02/2018  . Mild cognitive impairment 03/28/2018  . Aortic atherosclerosis ([place]) 02/24/2018  . 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 Cystocele, midline 04/28/2017  . Recurrent major depressive disorder, in partial remission ([place]) 03/17/2017  . Neuropathy 03/01/2017  . Lumbosacral radiculopathy 02/16/2017  . Bilateral pain of leg and foot 12/16/2016  . Fatigue 12/16/2016  . Trochanteric bursitis of both hips 12/16/2016  . Weakness of both legs 12/16/2016  . Stress incontinence of urine  10/15/2014  . Polypharmacy 09/02/2014  . Primary cancer of right upper lobe of lung ([place]) 07/19/2014  . Acid reflux 07/18/2014  . 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 Chronic kidney disease (CKD), stage III (moderate) ([place]) 08/30/2013  . Benign essential HTN 08/30/2013  . Fatty liver disease, nonalcoholic 96/22/2979  . Fibrositis 08/30/2013  . Hot flash, menopausal 08/30/2013  . Billowing mitral valve 08/30/2013  . Allergic rhinitis, seasonal 08/30/2013  . Fibromyalgia 08/30/2013     Past Surgical History:  Procedure Laterality Date  . ABDOMINAL HYSTERECTOMY    . KYPHOPLASTY N/A 05/04/2019   Procedure: KYPHOPLASTY T7;  Surgeon: Hessie Knows, MD;  Location: [facility] ORS;  Service: Orthopedics;  Laterality: N/A;  . [place]  . LARYNGOSCOPY N/A 07/22/2014   Procedure: JET LARYNGOSCOPY with right vocal cord biopsy;  Surgeon: Margaretha Sheffield, MD;  Location: [facility] ORS;  Service: ENT;  Laterality: N/A;  . LUNG CANCER SURGERY    . NECK SURGERY       Prior to Admission medications   Medication Sig Start Date End Date Taking? Authorizing Provider  acetaminophen (TYLENOL) 500 MG tablet Take 500 mg by mouth every 6 (six) hours as needed for mild pain.   Yes [provider]  azelastine (ASTELIN) 0.1 % nasal spray Place 2 sprays into both nostrils 2 (two) times daily as needed for rhinitis. Use in each nostril as directed   Yes [provider]  benazepril (LOTENSIN) 5 MG tablet Take 1 tablet by mouth daily. 03/04/14  Yes [provider]  cholecalciferol (VITAMIN D3) 25 MCG (1000 UNIT) tablet Take 1,000 Units by mouth daily.   Yes [provider]  donepezil (ARICEPT) 5 MG tablet Take 5 mg by mouth at bedtime.   Yes [provider]  estradiol (ESTRACE) 1 MG tablet Take 1 mg by mouth daily.  04/29/17  Yes [provider]  mirtazapine (REMERON) 15 MG tablet Take 15 mg by mouth at bedtime.   Yes  [provider]  Multiple Vitamins-Minerals (OCUVITE ADULT 50+ PO) Take 1 tablet by mouth daily.    Yes [provider]  omeprazole (PRILOSEC) 40 MG capsule Take 40 mg by mouth daily.    Yes [provider]  polyethylene glycol (MIRALAX / GLYCOLAX) 17 g packet Take 17 g by mouth daily as needed for moderate constipation.   Yes [provider]  vitamin B-12 (CYANOCOBALAMIN) 1000 MCG tablet Take 1,000 mcg by mouth daily.    Yes [provider]  nortriptyline (PAMELOR) 10 MG capsule Take 20 mg by mouth at bedtime.  03/21/19 02/04/20  [provider]     Allergies Alendronate, Nitrofurantoin, Sulfa antibiotics, Cefdinir, Ciprofloxacin, and Diphenhydramine   Family History  Problem Relation Age of Onset  . Kidney cancer Cousin   . Cancer Maternal Grandfather   . Cancer Maternal Grandmother   . Kidney disease Neg Hx   . Prostate cancer Neg Hx     Social History Social History   Tobacco Use  . Smoking status: Former Smoker    Packs/day: 0.50    Years: 25.00    Pack years: 12.50    Types: Cigarettes    Quit date: 07/18/2006    Years since quitting: 13.9  . Smokeless tobacco: Never Used  Vaping Use  . Vaping Use: Never used  Substance Use Topics  . Alcohol use: Yes    Alcohol/week: 2.0 standard drinks    Types: 1 Glasses of wine, 1 Cans of beer per week    Comment: occassionally about 4 times a year  . Drug use: No    Review of Systems  Constitutional: Positive fever.  ENT:   No sore throat. No rhinorrhea. Cardiovascular:   No chest pain or syncope. Respiratory:   No dyspnea or cough. Gastrointestinal:   Negative for abdominal pain, vomiting and diarrhea.  Positive urinary urgency Musculoskeletal:   Negative for focal pain or swelling All other systems reviewed and are negative except as documented above in ROS and HPI.  ____________________________________________   PHYSICAL EXAM:  VITAL SIGNS: ED Triage Vitals   Enc Vitals Group     BP 07/12/20 2118 (!) 147/90     Pulse Rate 07/12/20 2117 (!) 102     Resp 07/12/20 2117 19     Temp 07/12/20 2117 98.5 F (36.9 C)     Temp Source 07/12/20 2117 Oral     SpO2 07/12/20 2117 96 %     Weight 07/12/20 2112 143 lb (64.9 kg)     Height 07/12/20 2112 5\' 5"  (1.651 m)     Head Circumference --      Peak Flow --      Pain Score --      Pain Loc --      Pain Edu? --      Excl. in [place]? --     Vital signs reviewed, nursing assessments reviewed.   Constitutional:   Alert and oriented.  Ill-appearing. Eyes:   Conjunctivae are normal. EOMI. PERRL. ENT      Head:  Normocephalic and atraumatic.      Nose:   Normal.      Mouth/Throat:   Dry mucous membranes      Neck:   No meningismus. Full ROM. Hematological/Lymphatic/Immunilogical:   No cervical lymphadenopathy. Cardiovascular:   Tachycardia heart rate 115. Symmetric bilateral radial and DP pulses.  No murmurs. Cap refill less than 2 seconds. Respiratory:   Normal respiratory effort without tachypnea/retractions. Breath sounds are clear and equal bilaterally. No wheezes/rales/rhonchi. Gastrointestinal:   Soft with suprapubic tenderness. Non distended.   No rebound, rigidity, or guarding. Genitourinary:   deferred Musculoskeletal:   Normal range of motion in all extremities. No joint effusions.  No lower extremity tenderness.  No edema. Neurologic:   Normal speech and language.  Motor grossly intact. No acute focal neurologic deficits are appreciated.  Skin:    Skin is warm, dry and intact. No rash noted.  No petechiae, purpura, or bullae.  ____________________________________________    LABS (pertinent positives/negatives) (all labs ordered are listed, but only abnormal results are displayed) Labs Reviewed  COMPREHENSIVE METABOLIC PANEL - Abnormal; Notable for the following components:      Result Value   Glucose, Bld 126 (*)    All other components within normal limits  CBC WITH  DIFFERENTIAL/PLATELET - Abnormal; Notable for the following components:   WBC 16.6 (*)    Neutro Abs 14.7 (*)    All other components within normal limits  RESP PANEL BY RT-PCR (FLU A&B, COVID) ARPGX2  CULTURE, BLOOD (ROUTINE X 2)  CULTURE, BLOOD (ROUTINE X 2)  URINE CULTURE  LACTIC ACID, PLASMA  URINALYSIS, COMPLETE (UACMP) WITH MICROSCOPIC  PROTIME-INR  APTT   ____________________________________________   EKG  Interpreted by me Sinus tachycardia rate 104.  Normal axis and intervals.  Poor R wave progression.  Normal ST segments and T waves.  No ischemic changes.  ____________________________________________    RADIOLOGY  DG Chest Port 1 View  Result Date: 07/12/2020 CLINICAL DATA:  Questionable sepsis.  Generalized weakness. EXAM: PORTABLE CHEST 1 VIEW COMPARISON:  Chest x-ray dated 05/03/2019 and 12/05/2016. FINDINGS: Questionable small opacity at the RIGHT lung base. Lungs otherwise clear. No pleural effusion or pneumothorax is seen. Heart size and mediastinal contours are stable. No acute appearing osseous abnormality. IMPRESSION: Questionable small opacity at the RIGHT lung base. This could represent pneumonia, aspiration or atelectasis. Electronically Signed   By: Franki Cabot M.D.   On: 07/12/2020 22:11    ____________________________________________   PROCEDURES Procedures  ____________________________________________  DIFFERENTIAL DIAGNOSIS   Pneumonia, UTI, flu, COVID, dehydration, electrolyte abnormality  CLINICAL IMPRESSION / ASSESSMENT AND PLAN / ED COURSE  Medications ordered in the ED: Medications  cefTRIAXone (ROCEPHIN) 1 g in sodium chloride 0.9 % 100 mL IVPB (0 g Intravenous Stopped 07/12/20 2257)  azithromycin (ZITHROMAX) 500 mg in sodium chloride 0.9 % 250 mL IVPB (has no administration in time range)  sodium chloride 0.9 % bolus 1,000 mL (0 mLs Intravenous Stopped 07/12/20 2259)    Pertinent labs & imaging results that were available during my  care of the patient were reviewed by me and considered in my medical decision making (see chart for details).  Dorean Hiebert was evaluated in Emergency Department on 07/12/2020 for the symptoms described in the history of present illness. She was evaluated in the context of the global COVID-19 pandemic, which necessitated consideration that the patient might be at risk for infection with the SARS-CoV-2 virus that causes COVID-19. Institutional protocols and algorithms that pertain to the  evaluation of patients at risk for COVID-19 are in a state of rapid change based on information released by regulatory bodies including the CDC and federal and state organizations. These policies and algorithms were followed during the patient's care in the ED.   Patient presents with urinary symptoms, weakness, fever tachycardia.  Blood pressure unremarkable.  Sepsis work-up initiated.  Chest x-ray concerning for a possible pneumonia infiltrate at the right base.  Based on symptoms and exam, I suspect she has a recurrent UTI.  Rocephin ordered.  Lactate normal.  We will plan to admit for further management.      ____________________________________________   FINAL CLINICAL IMPRESSION(S) / ED DIAGNOSES    Final diagnoses:  Generalized weakness  Sepsis, due to unspecified organism, unspecified whether acute organ dysfunction present [facility])     ED Discharge Orders    None      Portions of this note were generated with dragon dictation software. Dictation errors may occur despite best attempts at proofreading.   Carrie Mew, MD 07/12/20 2351

## 2020-07-13 ENCOUNTER — Inpatient Hospital Stay: Payer: Medicare Other

## 2020-07-13 ENCOUNTER — Encounter: Payer: Self-pay | Admitting: Family Medicine

## 2020-07-13 LAB — URINALYSIS, COMPLETE (UACMP) WITH MICROSCOPIC
Bilirubin Urine: NEGATIVE
Glucose, UA: NEGATIVE mg/dL
Ketones, ur: NEGATIVE mg/dL
Nitrite: NEGATIVE
Protein, ur: NEGATIVE mg/dL
Specific Gravity, Urine: 1.012 (ref 1.005–1.030)
pH: 7 (ref 5.0–8.0)

## 2020-07-13 LAB — CBC
HCT: 38.2 % (ref 36.0–46.0)
Hemoglobin: 12.5 g/dL (ref 12.0–15.0)
MCH: 30.6 pg (ref 26.0–34.0)
MCHC: 32.7 g/dL (ref 30.0–36.0)
MCV: 93.4 fL (ref 80.0–100.0)
Platelets: 191 10*3/uL (ref 150–400)
RBC: 4.09 MIL/uL (ref 3.87–5.11)
RDW: 13 % (ref 11.5–15.5)
WBC: 19.2 10*3/uL — ABNORMAL HIGH (ref 4.0–10.5)
nRBC: 0 % (ref 0.0–0.2)

## 2020-07-13 LAB — APTT: aPTT: 33 s (ref 24–36)

## 2020-07-13 LAB — BASIC METABOLIC PANEL
Anion gap: 8 (ref 5–15)
BUN: 13 mg/dL (ref 8–23)
CO2: 25 mmol/L (ref 22–32)
Calcium: 8.4 mg/dL — ABNORMAL LOW (ref 8.9–10.3)
Chloride: 107 mmol/L (ref 98–111)
Creatinine, Ser: 0.87 mg/dL (ref 0.44–1.00)
GFR, Estimated: >60 mL/min (ref >60)
Glucose, Bld: 120 mg/dL — ABNORMAL HIGH (ref 70–99)
Potassium: 3.4 mmol/L — ABNORMAL LOW (ref 3.5–5.1)
Sodium: 140 mmol/L (ref 135–145)

## 2020-07-13 LAB — PROTIME-INR
INR: 1.1 (ref 0.8–1.2)
Prothrombin Time: 13.8 s (ref 11.4–15.2)

## 2020-07-13 LAB — STREP PNEUMONIAE URINARY ANTIGEN: Strep Pneumo Urinary Antigen: POSITIVE — AB

## 2020-07-13 LAB — PROCALCITONIN: Procalcitonin: 0.11 ng/mL

## 2020-07-13 LAB — CORTISOL-AM, BLOOD: Cortisol - AM: 11.7 ug/dL (ref 6.7–22.6)

## 2020-07-13 MED ORDER — MAGNESIUM HYDROXIDE 400 MG/5ML PO SUSP
30.0000 mL | Freq: Every day | ORAL | Status: DC | PRN
  Filled 2020-07-13: qty 30

## 2020-07-13 MED ORDER — AZELASTINE HCL 0.1 % NA SOLN
2.0000 | Freq: Two times a day (BID) | NASAL | Status: DC | PRN
  Filled 2020-07-13: qty 30

## 2020-07-13 MED ORDER — ONDANSETRON HCL 4 MG/2ML IJ SOLN: 4.0000 mg | Freq: Four times a day (QID) | INTRAMUSCULAR | Status: DC | PRN

## 2020-07-13 MED ORDER — ESTRADIOL 1 MG PO TABS
1.0000 mg | ORAL_TABLET | Freq: Every day | ORAL | Status: DC
  Administered 2020-07-13 – 2020-07-16 (×4): 1 mg via ORAL
  Filled 2020-07-13 (×4): qty 1

## 2020-07-13 MED ORDER — IPRATROPIUM-ALBUTEROL 0.5-2.5 (3) MG/3ML IN SOLN
3.0000 mL | Freq: Three times a day (TID) | RESPIRATORY_TRACT | Status: DC
  Administered 2020-07-13 – 2020-07-16 (×8): 3 mL via RESPIRATORY_TRACT
  Filled 2020-07-13 (×9): qty 3

## 2020-07-13 MED ORDER — SODIUM CHLORIDE 0.9 % IV SOLN
500.0000 mg | INTRAVENOUS | Status: DC
  Administered 2020-07-13: 22:00:00 500 mg via INTRAVENOUS
  Filled 2020-07-13 (×2): qty 500

## 2020-07-13 MED ORDER — GUAIFENESIN-DM 100-10 MG/5ML PO SYRP: 5.0000 mL | ORAL_SOLUTION | ORAL | Status: DC | PRN

## 2020-07-13 MED ORDER — ONDANSETRON HCL 4 MG PO TABS: 4.0000 mg | ORAL_TABLET | Freq: Four times a day (QID) | ORAL | Status: DC | PRN

## 2020-07-13 MED ORDER — OCUVITE-LUTEIN PO CAPS
ORAL_CAPSULE | Freq: Every day | ORAL | Status: DC
  Administered 2020-07-13 – 2020-07-16 (×4): 1 via ORAL
  Filled 2020-07-13 (×4): qty 1

## 2020-07-13 MED ORDER — PIPERACILLIN-TAZOBACTAM 3.375 G IVPB
3.3750 g | Freq: Three times a day (TID) | INTRAVENOUS | Status: DC
  Administered 2020-07-13 – 2020-07-15 (×7): 3.375 g via INTRAVENOUS
  Filled 2020-07-13 (×8): qty 50

## 2020-07-13 MED ORDER — MIRTAZAPINE 15 MG PO TABS
15.0000 mg | ORAL_TABLET | Freq: Every day | ORAL | Status: DC
  Administered 2020-07-13 (×2): 15 mg via ORAL
  Filled 2020-07-13 (×2): qty 1

## 2020-07-13 MED ORDER — PIPERACILLIN-TAZOBACTAM 3.375 G IVPB 30 MIN: 3.3750 g | Freq: Four times a day (QID) | INTRAVENOUS | Status: DC

## 2020-07-13 MED ORDER — IPRATROPIUM-ALBUTEROL 0.5-2.5 (3) MG/3ML IN SOLN
3.0000 mL | Freq: Four times a day (QID) | RESPIRATORY_TRACT | Status: DC
  Administered 2020-07-13: 08:00:00 3 mL via RESPIRATORY_TRACT
  Filled 2020-07-13: qty 3

## 2020-07-13 MED ORDER — ENOXAPARIN SODIUM 40 MG/0.4ML IJ SOSY
40.0000 mg | PREFILLED_SYRINGE | INTRAMUSCULAR | Status: DC
  Administered 2020-07-13 – 2020-07-16 (×4): 40 mg via SUBCUTANEOUS
  Filled 2020-07-13 (×4): qty 0.4

## 2020-07-13 MED ORDER — GUAIFENESIN ER 600 MG PO TB12
600.0000 mg | ORAL_TABLET | Freq: Two times a day (BID) | ORAL | Status: DC
  Administered 2020-07-13 – 2020-07-16 (×8): 600 mg via ORAL
  Filled 2020-07-13 (×8): qty 1

## 2020-07-13 MED ORDER — SODIUM CHLORIDE 0.9 % IV SOLN: 2.0000 g | INTRAVENOUS | Status: DC

## 2020-07-13 MED ORDER — ACETAMINOPHEN 650 MG RE SUPP: 650.0000 mg | Freq: Four times a day (QID) | RECTAL | Status: DC | PRN

## 2020-07-13 MED ORDER — VITAMIN D 25 MCG (1000 UNIT) PO TABS
1000.0000 [IU] | ORAL_TABLET | Freq: Every day | ORAL | Status: DC
  Administered 2020-07-13 – 2020-07-16 (×4): 1000 [IU] via ORAL
  Filled 2020-07-13 (×4): qty 1

## 2020-07-13 MED ORDER — NYSTATIN 100000 UNIT/ML MT SUSP
5.0000 mL | Freq: Four times a day (QID) | OROMUCOSAL | Status: DC
  Administered 2020-07-13 – 2020-07-16 (×9): 500000 [IU] via ORAL
  Filled 2020-07-13 (×11): qty 5

## 2020-07-13 MED ORDER — POTASSIUM CHLORIDE CRYS ER 20 MEQ PO TBCR
40.0000 meq | EXTENDED_RELEASE_TABLET | Freq: Once | ORAL | Status: AC
  Administered 2020-07-13: 10:00:00 40 meq via ORAL
  Filled 2020-07-13: qty 2

## 2020-07-13 MED ORDER — TRAZODONE HCL 50 MG PO TABS
25.0000 mg | ORAL_TABLET | Freq: Every evening | ORAL | Status: DC | PRN
  Administered 2020-07-13: 21:00:00 25 mg via ORAL
  Filled 2020-07-13: qty 1

## 2020-07-13 MED ORDER — HYDROCOD POLST-CPM POLST ER 10-8 MG/5ML PO SUER: 5.0000 mL | Freq: Two times a day (BID) | ORAL | Status: DC | PRN

## 2020-07-13 MED ORDER — LACTATED RINGERS IV SOLN: INTRAVENOUS | Status: DC

## 2020-07-13 MED ORDER — ACETAMINOPHEN 325 MG PO TABS
650.0000 mg | ORAL_TABLET | Freq: Four times a day (QID) | ORAL | Status: DC | PRN
  Administered 2020-07-13 – 2020-07-14 (×2): 650 mg via ORAL
  Filled 2020-07-13 (×2): qty 2

## 2020-07-13 MED ORDER — BENAZEPRIL HCL 5 MG PO TABS
5.0000 mg | ORAL_TABLET | Freq: Every day | ORAL | Status: DC
  Administered 2020-07-13 – 2020-07-16 (×4): 5 mg via ORAL
  Filled 2020-07-13 (×4): qty 1

## 2020-07-13 MED ORDER — VITAMIN B-12 1000 MCG PO TABS
1000.0000 ug | ORAL_TABLET | Freq: Every day | ORAL | Status: DC
  Administered 2020-07-13 – 2020-07-16 (×4): 1000 ug via ORAL
  Filled 2020-07-13 (×4): qty 1

## 2020-07-13 MED ORDER — POTASSIUM CHLORIDE CRYS ER 20 MEQ PO TBCR
20.0000 meq | EXTENDED_RELEASE_TABLET | Freq: Once | ORAL | Status: AC
  Administered 2020-07-13: 17:00:00 20 meq via ORAL
  Filled 2020-07-13: qty 1

## 2020-07-13 MED ORDER — DONEPEZIL HCL 5 MG PO TABS
5.0000 mg | ORAL_TABLET | Freq: Every day | ORAL | Status: DC
  Administered 2020-07-13 – 2020-07-15 (×4): 5 mg via ORAL
  Filled 2020-07-13 (×4): qty 1

## 2020-07-13 NOTE — ED Notes (Signed)
Request made for transport  

## 2020-07-13 NOTE — Progress Notes (Addendum)
PROGRESS NOTE    Christy [name]  SJG:[id number] DOB: August 20, 1940 DOA: 07/12/2020 PCP: Ezequiel Kayser, MD   Brief Narrative: 80 year old with past medical history significant for CAD, hypertension, lung cancer status post right upper lobectomy, depression anxiety who presents to the ED complaining of acute onset of generalized weakness associated with cough, occasional wheezing and associated dyspnea.  Patient also reports dysuria and urinary frequency.  She also complained of neck pain. Evaluation in the ED patient was found to be tachycardic tachypnea respiration rate 25, leukocytosis 16, COVID PCR negative.  Chest x-ray questionable small opacity right lung base that may represent pneumonia, aspiration or atelectasis.    Assessment & Plan:   Active Problems:   Sepsis ([place])  1-Sepsis secondary to pneumonia: Present On admission. Concern for UTI as well Patient presents with tachycardia, tachypnea respiration rate 25, leukocytosis.  Chest x-ray with opacity right lung base. Continue with Zosyn and azithromycin.  Follow Blood culture, urine culture.  Sputum Culture ordered.  Strep Pneumonia and legionella antigen ordered.  WBC increase to 19. Continue with antibiotics.  Speech evaluation.  Daughter report patient had choking episode (piece of chicken) yesterday during lunch at the facility . Plan to change diet to bland. Daughter will also bring patient denture tomorrow. Will proceed with speech evaluation.   2-Hypertension: Continue with benazepril  3-Depression, dementia: Continue with Aricept, Cymbalta and Remeron.  4-B12 deficiency: Continue with B12 supplement  5-Oral candidiasis;  Started Nystatin.   6-Hypokalemia; replete orally.  7-Neck pain; daughter report patient has been complaining of worsening neck pain since recent fall at ALF last week. -Check Cervical Spine X ray.   Estimated body mass index is 24.2 kg/m as calculated from the following:   Height as of  this encounter: 5\' 6"  (1.676 m).   Weight as of this encounter: 68 kg.   DVT prophylaxis: Lovenox Code Status: Full code, no intubation. Family Communication: Daughter updated 6/05  Disposition Plan:  Status is: Inpatient  Remains inpatient appropriate because:IV treatments appropriate due to intensity of illness or inability to take PO   Dispo: The patient is from: SNF              Anticipated d/c is to: SNF              Patient currently is not medically stable to d/c.   Difficult to place patient No        Consultants:   None  Procedures:   None  Antimicrobials:  Zosyn 6/05 Azithro 6/04  Subjective: She is alert, feels tired.  She report productive cough. Denies abdominal pain. Report dysuria has improved.   Objective: Vitals:   07/12/20 2118 07/12/20 2200 07/13/20 0135 07/13/20 0413  BP: (!) 147/90 (!) 165/77 (!) 146/67 (!) 146/68  Pulse:  (!) 116 99 86  Resp:  17 18 16   Temp:   99.6 F (37.6 C) 98.8 F (37.1 C)  TempSrc:   Oral Oral  SpO2:  94% 95% 93%  Weight:   68 kg   Height:   5\' 6"  (1.676 m)     Intake/Output Summary (Last 24 hours) at 07/13/2020 0732 Last data filed at 07/13/2020 0416 Gross per 24 hour  Intake 367.17 ml  Output 225 ml  Net 142.17 ml   Filed Weights   07/12/20 2112 07/13/20 0135  Weight: 64.9 kg 68 kg    Examination:  General exam: Appears calm and comfortable  Respiratory system: Clear to auscultation. Respiratory effort normal. Cardiovascular system: S1 &  S2 heard, RRR. No JVD, murmurs, rubs, gallops or clicks. No pedal edema. Gastrointestinal system: Abdomen is nondistended, soft and nontender. No organomegaly or masses felt. Normal bowel sounds heard. Central nervous system: Alert and oriented.  Extremities: Symmetric 5 x 5 power.    Data Reviewed: I have personally reviewed following labs and imaging studies  CBC: Recent Labs  Lab 07/12/20 2133 07/13/20 0600  WBC 16.6* 19.2*  NEUTROABS 14.7*  --   HGB  13.9 12.5  HCT 43.1 38.2  MCV 92.7 93.4  PLT 230 650   Basic Metabolic Panel: Recent Labs  Lab 07/12/20 2133 07/13/20 0600  NA 140 140  K 3.6 3.4*  CL 104 107  CO2 26 25  GLUCOSE 126* 120*  BUN 15 13  CREATININE 0.83 0.87  CALCIUM 9.3 8.4*   GFR: Estimated Creatinine Clearance: 47.5 mL/min (by C-G formula based on SCr of 0.87 mg/dL). Liver Function Tests: Recent Labs  Lab 07/12/20 2133  AST 23  ALT 14  ALKPHOS 77  BILITOT 1.1  PROT 7.3  ALBUMIN 3.7   No results for input(s): LIPASE, AMYLASE in the last 168 hours. No results for input(s): AMMONIA in the last 168 hours. Coagulation Profile: Recent Labs  Lab 07/13/20 0556  INR 1.1   Cardiac Enzymes: No results for input(s): CKTOTAL, CKMB, CKMBINDEX, TROPONINI in the last 168 hours. BNP (last 3 results) No results for input(s): PROBNP in the last 8760 hours. HbA1C: No results for input(s): HGBA1C in the last 72 hours. CBG: No results for input(s): GLUCAP in the last 168 hours. Lipid Profile: No results for input(s): CHOL, HDL, LDLCALC, TRIG, CHOLHDL, LDLDIRECT in the last 72 hours. Thyroid Function Tests: No results for input(s): TSH, T4TOTAL, FREET4, T3FREE, THYROIDAB in the last 72 hours. Anemia Panel: No results for input(s): VITAMINB12, FOLATE, FERRITIN, TIBC, IRON, RETICCTPCT in the last 72 hours. Sepsis Labs: Recent Labs  Lab 07/12/20 2119  LATICACIDVEN 1.1    Recent Results (from the past 240 hour(s))  Resp Panel by RT-PCR (Flu A&B, Covid) Nasopharyngeal Swab     Status: None   Collection Time: 07/12/20  9:40 PM   Specimen: Nasopharyngeal Swab; Nasopharyngeal(NP) swabs in vial transport medium  Result Value Ref Range Status   SARS Coronavirus 2 by RT PCR NEGATIVE NEGATIVE Final    Comment: (NOTE) SARS-CoV-2 target nucleic acids are NOT DETECTED.  The SARS-CoV-2 RNA is generally detectable in upper respiratory specimens during the acute phase of infection. The lowest concentration of  SARS-CoV-2 viral copies this assay can detect is 138 copies/mL. A negative result does not preclude SARS-Cov-2 infection and should not be used as the sole basis for treatment or other patient management decisions. A negative result may occur with  improper specimen collection/handling, submission of specimen other than nasopharyngeal swab, presence of viral mutation(s) within the areas targeted by this assay, and inadequate number of viral copies(<138 copies/mL). A negative result must be combined with clinical observations, patient history, and epidemiological information. The expected result is Negative.  Fact Sheet for Patients:  EntrepreneurPulse.com.au  Fact Sheet for Healthcare Providers:  IncredibleEmployment.be  This test is no t yet approved or cleared by the [place] FDA and  has been authorized for detection and/or diagnosis of SARS-CoV-2 by FDA under an Emergency Use Authorization (EUA). This EUA will remain  in effect (meaning this test can be used) for the duration of the COVID-19 declaration under Section 564(b)(1) of the Act, 21 U.S.C.section 360bbb-3(b)(1), unless the authorization is terminated  or revoked sooner.       Influenza A by PCR NEGATIVE NEGATIVE Final   Influenza B by PCR NEGATIVE NEGATIVE Final    Comment: (NOTE) The Xpert Xpress SARS-CoV-2/FLU/RSV plus assay is intended as an aid in the diagnosis of influenza from Nasopharyngeal swab specimens and should not be used as a sole basis for treatment. Nasal washings and aspirates are unacceptable for Xpert Xpress SARS-CoV-2/FLU/RSV testing.  Fact Sheet for Patients: EntrepreneurPulse.com.au  Fact Sheet for Healthcare Providers: IncredibleEmployment.be  This test is not yet approved or cleared by the [place] FDA and has been authorized for detection and/or diagnosis of SARS-CoV-2 by FDA under an Emergency Use  Authorization (EUA). This EUA will remain in effect (meaning this test can be used) for the duration of the COVID-19 declaration under Section 564(b)(1) of the Act, 21 U.S.C. section 360bbb-3(b)(1), unless the authorization is terminated or revoked.  Performed at [facility], [address] [place], [place] [address]          Radiology Studies: [facility] Chest [place] 1 View  Result Date: 07/12/2020 CLINICAL DATA:  Questionable sepsis.  Generalized weakness. EXAM: PORTABLE CHEST 1 VIEW COMPARISON:  Chest x-ray dated 05/03/2019 and 12/05/2016. FINDINGS: Questionable small opacity at the RIGHT lung base. Lungs otherwise clear. No pleural effusion or pneumothorax is seen. Heart size and mediastinal contours are stable. No acute appearing osseous abnormality. IMPRESSION: Questionable small opacity at the RIGHT lung base. This could represent pneumonia, aspiration or atelectasis. Electronically Signed   By: Franki Cabot M.D.   On: 07/12/2020 22:11        Scheduled Meds: . benazepril  5 mg Oral Daily  . cholecalciferol  1,000 Units Oral Daily  . donepezil  5 mg Oral QHS  . enoxaparin (LOVENOX) injection  40 mg Subcutaneous Q24H  . estradiol  1 mg Oral Daily  . guaiFENesin  600 mg Oral BID  . ipratropium-albuterol  3 mL Nebulization QID  . mirtazapine  15 mg Oral QHS  . multivitamin-lutein   Oral Daily  . potassium chloride  40 mEq Oral Once  . vitamin B-12  1,000 mcg Oral Daily   Continuous Infusions: . azithromycin    . lactated ringers    . piperacillin-tazobactam (ZOSYN)  IV 3.375 g (07/13/20 0210)     LOS: 1 day    Time spent: 35 minutes.     Elmarie Shiley, MD [facility]   If 7PM-7AM, please contact night-coverage [email or web address]  07/13/2020, 7:32 AM

## 2020-07-13 NOTE — H&P (Signed)
Christy [name]   PATIENT NAME: Christy [name]    MR#:  [id number]  DATE OF BIRTH:  [birth year]  DATE OF ADMISSION:  07/12/2020  PRIMARY CARE PHYSICIAN: Ezequiel Kayser, MD   Patient is coming from: [facility] skilled nursing facility.  REQUESTING/REFERRING PHYSICIAN: Brenton Grills, MD  CHIEF COMPLAINT:   Chief Complaint  Patient presents with  . Weakness    HISTORY OF PRESENT ILLNESS:  Christy [name] is a 80 y.o. frail Caucasian female with medical history significant for coronary artery disease, essential hypertension, lung cancer status post right upper lobectomy, depression and anxiety, who presented to the emergency room from her skilled nursing facility with acute onset of generalized weakness with associated congested cough and inability to expectorate as well as occasional wheezing and associated dyspnea.  She also admits to dysuria and urinary frequency without hematuria or flank pain.  She has been having mild neck pain and stiffness without headache or dizziness or blurred vision..  She reportedly had a fever of 102 per EMS and heart rate of 120 and was having suprapubic abdominal pressure. ED Course: When she came to the ER heart rate was 102 with otherwise normal vital signs.  Respiratory rate was normal and later 25.  Heart rate was later 108 as well.  Labs revealed cytosis 16.6 with neutrophilia and CMP was unremarkable.  Influenza antigens and COVID-19 PCR came back negative.  EKG as reviewed by me : Showed sinus tachycardia with a rate of 104 Imaging: Chest x-ray showed questionable small opacity at the right lung base that may represent pneumonia, aspiration or atelectasis.  The patient was ordered IV Rocephin and Zithromax as well as 1 L bolus of IV normal saline.  He has a reported allergy to cefdinir however and therefore I will switch his Rocephin with IV Zosyn.  She will be admitted to medical monitored bed for further evaluation and management. PAST  MEDICAL HISTORY:   Past Medical History:  Diagnosis Date  . Allergy   . Anxiety   . Depression   . Fatty liver disease, nonalcoholic   . GERD (gastroesophageal reflux disease)   . Heart disease   . Hypertension   . Lung cancer ([place]) 02/2014   RUL Lobectomy  . Urinary incontinence     PAST SURGICAL HISTORY:   Past Surgical History:  Procedure Laterality Date  . ABDOMINAL HYSTERECTOMY    . KYPHOPLASTY N/A 05/04/2019   Procedure: KYPHOPLASTY T7;  Surgeon: Hessie Knows, MD;  Location: [facility] ORS;  Service: Orthopedics;  Laterality: N/A;  . [place]  . LARYNGOSCOPY N/A 07/22/2014   Procedure: JET LARYNGOSCOPY with right vocal cord biopsy;  Surgeon: Margaretha Sheffield, MD;  Location: [facility] ORS;  Service: ENT;  Laterality: N/A;  . LUNG CANCER SURGERY    . NECK SURGERY      SOCIAL HISTORY:   Social History   Tobacco Use  . Smoking status: Former Smoker    Packs/day: 0.50    Years: 25.00    Pack years: 12.50    Types: Cigarettes    Quit date: 07/18/2006    Years since quitting: 13.9  . Smokeless tobacco: Never Used  Substance Use Topics  . Alcohol use: Yes    Alcohol/week: 2.0 standard drinks    Types: 1 Glasses of wine, 1 Cans of beer per week    Comment: occassionally about 4 times a year    FAMILY HISTORY:   Family History  Problem Relation Age of  Onset  . Kidney cancer Cousin   . Cancer Maternal Grandfather   . Cancer Maternal Grandmother   . Kidney disease Neg Hx   . Prostate cancer Neg Hx     DRUG ALLERGIES:   Allergies  Allergen Reactions  . Alendronate Other (See Comments)    Esophageal burning  . Nitrofurantoin Nausea Only and Nausea And Vomiting  . Sulfa Antibiotics Other (See Comments)  . Cefdinir   . Ciprofloxacin Other (See Comments)    Other Reaction: GI UPSET  . Diphenhydramine Other (See Comments)    REVIEW OF SYSTEMS:   ROS As per history of present illness. All pertinent systems were reviewed above. Constitutional,  HEENT, cardiovascular, respiratory, GI, GU, musculoskeletal, neuro, psychiatric, endocrine, integumentary and hematologic systems were reviewed and are otherwise negative/unremarkable except for positive findings mentioned above in the HPI.   MEDICATIONS AT HOME:   Prior to Admission medications   Medication Sig Start Date End Date Taking? Authorizing Provider  acetaminophen (TYLENOL) 500 MG tablet Take 500 mg by mouth every 6 (six) hours as needed for mild pain.   Yes [provider]  azelastine (ASTELIN) 0.1 % nasal spray Place 2 sprays into both nostrils 2 (two) times daily as needed for rhinitis. Use in each nostril as directed   Yes [provider]  benazepril (LOTENSIN) 5 MG tablet Take 1 tablet by mouth daily. 03/04/14  Yes [provider]  cholecalciferol (VITAMIN D3) 25 MCG (1000 UNIT) tablet Take 1,000 Units by mouth daily.   Yes [provider]  donepezil (ARICEPT) 5 MG tablet Take 5 mg by mouth at bedtime.   Yes [provider]  estradiol (ESTRACE) 1 MG tablet Take 1 mg by mouth daily.  04/29/17  Yes [provider]  mirtazapine (REMERON) 15 MG tablet Take 15 mg by mouth at bedtime.   Yes [provider]  Multiple Vitamins-Minerals (OCUVITE ADULT 50+ PO) Take 1 tablet by mouth daily.    Yes [provider]  omeprazole (PRILOSEC) 40 MG capsule Take 40 mg by mouth daily.    Yes [provider]  polyethylene glycol (MIRALAX / GLYCOLAX) 17 g packet Take 17 g by mouth daily as needed for moderate constipation.   Yes [provider]  vitamin B-12 (CYANOCOBALAMIN) 1000 MCG tablet Take 1,000 mcg by mouth daily.    Yes [provider]  nortriptyline (PAMELOR) 10 MG capsule Take 20 mg by mouth at bedtime.  03/21/19 02/04/20  [provider]      VITAL SIGNS:  Blood pressure (!) 165/77, pulse (!) 116, temperature 98.5 F (36.9 C), temperature source Oral, resp. rate 17, height 5\' 5"   (1.651 m), weight 64.9 kg, SpO2 94 %.  PHYSICAL EXAMINATION:  Physical Exam  GENERAL:  80 y.o.-year-old frail Caucasian female patient lying in the bed with no acute distress.  EYES: Pupils equal, round, reactive to light and accommodation. No scleral icterus. Extraocular muscles intact.  HEENT: Head atraumatic, normocephalic. Oropharynx and nasopharynx clear.  NECK:  Supple, no jugular venous distention. No thyroid enlargement, no tenderness.  LUNGS: Diminished bibasal breath sounds with right basal crackles. CARDIOVASCULAR: Regular rate and rhythm, S1, S2 normal. No murmurs, rubs, or gallops.  ABDOMEN: Soft, nondistended, with mild suprapubic tenderness without rebound tenderness guarding or rigidity.  Bowel sounds present. No organomegaly or mass.  EXTREMITIES: No pedal edema, cyanosis, or clubbing.  NEUROLOGIC: Cranial nerves II through XII are intact. Muscle strength 5/5 in all extremities. Sensation intact. Gait not checked.  Adequate range of motion of her neck without meningeal signs. PSYCHIATRIC: The patient is alert and oriented x 3.  Normal affect and good eye contact. SKIN: No obvious rash, lesion, or ulcer.   LABORATORY PANEL:   CBC Recent Labs  Lab 07/12/20 2133  WBC 16.6*  HGB 13.9  HCT 43.1  PLT 230   ------------------------------------------------------------------------------------------------------------------  Chemistries  Recent Labs  Lab 07/12/20 2133  NA 140  K 3.6  CL 104  CO2 26  GLUCOSE 126*  BUN 15  CREATININE 0.83  CALCIUM 9.3  AST 23  ALT 14  ALKPHOS 77  BILITOT 1.1   ------------------------------------------------------------------------------------------------------------------  Cardiac Enzymes No results for input(s): TROPONINI in the last 168 hours. ------------------------------------------------------------------------------------------------------------------  RADIOLOGY:  DG Chest Port 1 View  Result Date:  07/12/2020 CLINICAL DATA:  Questionable sepsis.  Generalized weakness. EXAM: PORTABLE CHEST 1 VIEW COMPARISON:  Chest x-ray dated 05/03/2019 and 12/05/2016. FINDINGS: Questionable small opacity at the RIGHT lung base. Lungs otherwise clear. No pleural effusion or pneumothorax is seen. Heart size and mediastinal contours are stable. No acute appearing osseous abnormality. IMPRESSION: Questionable small opacity at the RIGHT lung base. This could represent pneumonia, aspiration or atelectasis. Electronically Signed   By: Franki Cabot M.D.   On: 07/12/2020 22:11      IMPRESSION AND PLAN:  Active Problems:   Sepsis ([place])  1.  Sepsis secondary to pneumonia.  We will need to rule out UTI as well. - The patient will be admitted to a medical monitored bed. - We will place her on IV Zosyn and Zithromax that would cover her for pneumonia with possible aspiration as well as UTI. - Mucolytic therapy will be given as well as bronchodilator therapy. - We will follow blood cultures and obtain sputum culture sensitivity. - The patient will be hydrated with IV normal saline. - We will follow lactic acid level.  2.  Essential hypertension. - We will continue benazepril.  3.  Dementia and depression. - We will continue Aricept, Cymbalta and Remeron.  4.  Vitamin B12 deficiency. - We will continue vitamin B12.  DVT prophylaxis: Lovenox. Code Status: The patient is DO NOT INTUBATE only.  This was discussed with her.  Family Communication:  The plan of care was discussed in details with the patient (her family is aware she is here and she requested no further notification tonight). I answered all questions. The patient agreed to proceed with the above mentioned plan. Further management will depend upon hospital course. Disposition Plan: Back to previous home environment Consults called: none.  All the records are reviewed and case discussed with ED provider.  Status is: Inpatient  Remains inpatient  appropriate because:Ongoing diagnostic testing needed not appropriate for outpatient work up, Unsafe d/c plan, IV treatments appropriate due to intensity of illness or inability to take PO and Inpatient level of care appropriate due to severity of illness   Dispo: The patient is from: SNF              Anticipated d/c is to: SNF              Patient currently is not medically stable to d/c.   Difficult to place patient No   TOTAL TIME TAKING CARE OF THIS PATIENT: 55 minutes.    Christel Mormon M.D on 07/13/2020 at 12:41 AM  [facility]   From 7 PM-7 AM, contact night-coverage [email or web address]  CC: Primary care physician; Ezequiel Kayser, MD

## 2020-07-14 DIAGNOSIS — R531 Weakness: Secondary | ICD-10-CM

## 2020-07-14 LAB — BASIC METABOLIC PANEL
Anion gap: 6 (ref 5–15)
BUN: 14 mg/dL (ref 8–23)
CO2: 26 mmol/L (ref 22–32)
Calcium: 8.7 mg/dL — ABNORMAL LOW (ref 8.9–10.3)
Chloride: 108 mmol/L (ref 98–111)
Creatinine, Ser: 1 mg/dL (ref 0.44–1.00)
GFR, Estimated: 57 mL/min — ABNORMAL LOW (ref >60)
Glucose, Bld: 102 mg/dL — ABNORMAL HIGH (ref 70–99)
Potassium: 4.5 mmol/L (ref 3.5–5.1)
Sodium: 140 mmol/L (ref 135–145)

## 2020-07-14 LAB — CBC
HCT: 35.6 % — ABNORMAL LOW (ref 36.0–46.0)
Hemoglobin: 11.6 g/dL — ABNORMAL LOW (ref 12.0–15.0)
MCH: 30.8 pg (ref 26.0–34.0)
MCHC: 32.6 g/dL (ref 30.0–36.0)
MCV: 94.4 fL (ref 80.0–100.0)
Platelets: 173 10*3/uL (ref 150–400)
RBC: 3.77 MIL/uL — ABNORMAL LOW (ref 3.87–5.11)
RDW: 13.2 % (ref 11.5–15.5)
WBC: 13.2 10*3/uL — ABNORMAL HIGH (ref 4.0–10.5)
nRBC: 0 % (ref 0.0–0.2)

## 2020-07-14 MED ORDER — ENSURE ENLIVE PO LIQD
237.0000 mL | Freq: Two times a day (BID) | ORAL | Status: DC
  Administered 2020-07-14 – 2020-07-15 (×3): 237 mL via ORAL

## 2020-07-14 MED ORDER — TRAZODONE HCL 50 MG PO TABS
25.0000 mg | ORAL_TABLET | Freq: Every evening | ORAL | Status: DC | PRN
  Administered 2020-07-14: 25 mg via ORAL
  Filled 2020-07-14: qty 1

## 2020-07-14 MED ORDER — DICLOFENAC SODIUM 1 % EX GEL
2.0000 g | Freq: Four times a day (QID) | CUTANEOUS | Status: DC
  Administered 2020-07-14 – 2020-07-16 (×7): 2 g via TOPICAL
  Filled 2020-07-14: qty 100

## 2020-07-14 MED ORDER — DULOXETINE HCL 20 MG PO CPEP
20.0000 mg | ORAL_CAPSULE | Freq: Every day | ORAL | Status: DC
  Administered 2020-07-14 – 2020-07-16 (×3): 20 mg via ORAL
  Filled 2020-07-14 (×4): qty 1

## 2020-07-14 NOTE — Evaluation (Signed)
Physical Therapy Evaluation Patient Details Name: Christy [name] MRN: [id number] DOB: 1940-06-09 Today's Date: 07/14/2020   History of Present Illness  Per MD notes: Pt is an 80 y.o. frail female with medical history significant for coronary artery disease, dementia, essential hypertension, lung cancer status post right upper lobectomy, depression and anxiety, who presented to the emergency room from her skilled nursing facility with acute onset of generalized weakness with associated congested cough as well as occasional wheezing and associated dyspnea.  MD assessment includes: Sepsis secondary to pneumonia and hypokalemia.  Of note pt with neck pain since recent fall at ALF.  Per radialogy impression "No acute findings in the cervical spine. Please note CT is more sensitive for cervical spine fracture."    Clinical Impression  Pt was pleasant and motivated to participate during the session. Pt put forth good effort throughout but ultimately presented with significant deficits in function per below. Pt required physical assistance with all functional tasks and was unsteady in standing requiring near constant physical assistance to prevent LOB.  Pt reported no adverse symptoms during the session with SpO2 and HR WNL on room air.  Pt would not be safe to return to her prior living situation at this time and is at a very high risk for future falls.  Pt will benefit from PT services in a SNF setting upon discharge to safely address deficits listed in patient problem list for decreased caregiver assistance and eventual return to PLOF.      Follow Up Recommendations SNF;Supervision/Assistance - 24 hour    Equipment Recommendations  None recommended by PT    Recommendations for Other Services       Precautions / Restrictions Precautions Precautions: Fall Restrictions Weight Bearing Restrictions: No      Mobility  Bed Mobility Overal bed mobility: Needs Assistance Bed Mobility:  Rolling;Supine to Sit;Sit to Supine Rolling: Supervision   Supine to sit: Mod assist Sit to supine: Mod assist   General bed mobility comments: Mod A for both BLE as well as trunk control with cues for sequencing    Transfers Overall transfer level: Needs assistance Equipment used: Rolling walker (2 wheeled) Transfers: Sit to/from Stand;Lateral/Scoot Transfers Sit to Stand: Mod assist        Lateral/Scoot Transfers: Supervision General transfer comment: Mod cues for sequencing for hand placement and increased trunk flexion and mod A to come to full upright position and to prevent posterior LOB once in standing  Ambulation/Gait Ambulation/Gait assistance: Mod assist Gait Distance (Feet): 1 Feet Assistive device: Rolling walker (2 wheeled) Gait Pattern/deviations: Step-to pattern;Trunk flexed;Shuffle Gait velocity: decreased   General Gait Details: Pt only able to take several very short, shuffling steps with great effort and with mod A to prevent post LOB  Stairs            Wheelchair Mobility    Modified Rankin (Stroke Patients Only)       Balance Overall balance assessment: Needs assistance;History of Falls Sitting-balance support: Feet unsupported;Single extremity supported Sitting balance-Leahy Scale: Poor Sitting balance - Comments: Frequent use of bed rail for stability in sitting Postural control: Posterior lean Standing balance support: Bilateral upper extremity supported;During functional activity Standing balance-Leahy Scale: Poor Standing balance comment: Frequent Mod A to prevent post LOB                             Pertinent Vitals/Pain Pain Assessment: No/denies pain    Home Living Family/patient expects  to be discharged to:: Assisted living               Home Equipment: Gilford Rile - 2 wheels;Shower seat - built in;Grab bars - toilet      Prior Function Level of Independence: Needs assistance   Gait / Transfers Assistance  Needed: SBA with ambulation with a RW in her ALF room, able to ambulate down hall of ALF with PT but used a w/c for facility mobility with staff; Pt endorses 4 falls in the last 6 months secondary to legs buckling and/or general LOB  ADL's / Homemaking Assistance Needed: Assist from ALF staff with all ADLs, meals, meds, etc..        Hand Dominance   Dominant Hand: Right    Extremity/Trunk Assessment   Upper Extremity Assessment Upper Extremity Assessment: Generalized weakness    Lower Extremity Assessment Lower Extremity Assessment: Generalized weakness       Communication   Communication: No difficulties  Cognition Arousal/Alertness: Awake/alert Behavior During Therapy: WFL for tasks assessed/performed Overall Cognitive Status: History of cognitive impairments - at baseline                                 General Comments: Pt pleasant and able to provide mostly accurate history with only occasional clarification from dtr in room, able to follow commands well with some extra time and cuing      General Comments      Exercises Total Joint Exercises Towel Squeeze: Strengthening;Both;10 reps Heel Slides: AROM;Strengthening;Both;5 reps Long Arc Quad: AROM;Strengthening;Both;10 reps Knee Flexion: AROM;Strengthening;Both;10 reps Marching in Standing: AROM;Strengthening;Both;10 reps;Seated Other Exercises Other Exercises: Static unsupported sitting at EOB for improved activity tolerance Other Exercises: Anterior weight shifting in standing to prevent post LOB   Assessment/Plan    PT Assessment Patient needs continued PT services  PT Problem List Decreased strength;Decreased activity tolerance;Decreased balance;Decreased mobility;Decreased knowledge of use of DME       PT Treatment Interventions DME instruction;Gait training;Functional mobility training;Therapeutic activities;Therapeutic exercise;Balance training;Patient/family education    PT Goals  (Current goals can be found in the Care Plan section)  Acute Rehab PT Goals Patient Stated Goal: To get stronger and walk better PT Goal Formulation: With patient Time For Goal Achievement: 07/27/20 Potential to Achieve Goals: Good    Frequency Min 2X/week   Barriers to discharge Decreased caregiver support      Co-evaluation               AM-PAC PT "6 Clicks" Mobility  Outcome Measure Help needed turning from your back to your side while in a flat bed without using bedrails?: A Lot Help needed moving from lying on your back to sitting on the side of a flat bed without using bedrails?: A Lot Help needed moving to and from a bed to a chair (including a wheelchair)?: A Lot Help needed standing up from a chair using your arms (e.g., wheelchair or bedside chair)?: A Lot Help needed to walk in hospital room?: Total Help needed climbing 3-5 steps with a railing? : Total 6 Click Score: 10    End of Session Equipment Utilized During Treatment: Gait belt Activity Tolerance: Patient tolerated treatment well Patient left: in bed;with call bell/phone within reach;with bed alarm set;with family/visitor present Nurse Communication: Mobility status PT Visit Diagnosis: Unsteadiness on feet (R26.81);History of falling (Z91.81);Repeated falls (R29.6);Muscle weakness (generalized) (M62.81);Difficulty in walking, not elsewhere classified (R26.2)    Time: 1610-9604  PT Time Calculation (min) (ACUTE ONLY): 49 min   Charges:   PT Evaluation $PT Eval Moderate Complexity: 1 Mod PT Treatments $Therapeutic Exercise: 8-22 mins $Therapeutic Activity: 8-22 mins        D. Scott Marycruz Boehner PT, DPT 07/14/20, 11:15 AM

## 2020-07-14 NOTE — Evaluation (Signed)
Clinical/Bedside Swallow Evaluation Patient Details  Name: Christy [name] MRN: [id number] Date of Birth: Jul 09, 1940  Today's Date: 07/14/2020 Time: SLP Start Time (ACUTE ONLY): 1155 SLP Stop Time (ACUTE ONLY): 1255 SLP Time Calculation (min) (ACUTE ONLY): 60 min  Past Medical History:  Past Medical History:  Diagnosis Date  . Allergy   . Anxiety   . Depression   . Fatty liver disease, nonalcoholic   . GERD (gastroesophageal reflux disease)   . Heart disease   . Hypertension   . Lung cancer ([place]) 02/2014   RUL Lobectomy  . Urinary incontinence    Past Surgical History:  Past Surgical History:  Procedure Laterality Date  . ABDOMINAL HYSTERECTOMY    . KYPHOPLASTY N/A 05/04/2019   Procedure: KYPHOPLASTY T7;  Surgeon: Hessie Knows, MD;  Location: [facility] ORS;  Service: Orthopedics;  Laterality: N/A;  . [place]  . LARYNGOSCOPY N/A 07/22/2014   Procedure: JET LARYNGOSCOPY with right vocal cord biopsy;  Surgeon: Margaretha Sheffield, MD;  Location: [facility] ORS;  Service: ENT;  Laterality: N/A;  . LUNG CANCER SURGERY    . NECK SURGERY     HPI:  Pt is a 80 y.o. 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  She also admits to dysuria and urinary frequency without hematuria or flank pain.  She has been having mild neck pain and stiffness without headache or dizziness or blurred vision..  She reportedly had a fever of 102 per EMS and heart rate of 120 and was having suprapubic abdominal pressure.  Urine cultures drawn w/ "urine culture Growing Serratia Liquefaciens"  CXR: "Questionable small opacity at the  RIGHT lung base. This could  represent pneumonia, aspiration or atelectasis".    Of note, pt is on Aricept -- unsure of Baseline Cognitive status.   Assessment / Plan / Recommendation Clinical Impression  Pt appears to present w/ grossly adequate oropharyngeal phase swallow w/ reduced risk for aspriation (especially w/ thin liquids) when following general aspiration precautions. No overt neuromuscular deficits noted. Pt consumed po trials w/ No overt, clinical s/s of aspiration during po trials except during multiple sips of thin liquids x1 episode -- pt exhibited an immediate cough response. Pt appears to benefit from general aspiration precautions including Cup Drinking - No Straws; reduced Talking and distractions at meals. Both pt and Daughter endorsed pt is easily distracted or talking at a meal and that has been when coughing has been noted. Pt had an event of "choking on a piece of chicken" at her facility.   During po trials, pt consumed all consistencies including a Mixed consistency food w/ no overt coughing, decline in vocal quality, or change in respiratory presentation during/post trials except for the 1 episode w/ multiple sip drinking of thin liquids. This did not occur again w/ pt taking 1 sip at a time, Slowly. Oral phase appeared [facility] w/ timely bolus management, mastication, and control of bolus propulsion for A-P transfer for swallowing. Oral clearing achieved w/ all trial consistencies. Soft, moist foods appeared beneficial for pt's mastication and overall oral management. OM Exam appeared [facility] w/ no unilateral weakness noted. Speech Clear. Pt fed self w/ setup support.   Recommend continue a  Regular consistency diet w/ well-Cut meats, moistened foods (more mech soft in nature); Thin liquids VIA CUP for increased control and safer swallowing. Recommend general aspiration precautions including Reducing Distractions and Talking during meals, Pills WHOLE in Puree for safer, easier swallowing  as pt described Larger pills causing difficulty to swallow, as well as risk for aspiration w/ thin liquids is increased. Education given on Pills in Puree; food consistencies and easy to eat options; general aspiration precautions. NSG updated. SLP Visit Diagnosis: Dysphagia, pharyngeal phase (R13.13)    Aspiration Risk  Mild aspiration risk;Risk for inadequate nutrition/hydration (reduced when following general aspiration precautions)    Diet Recommendation   Regular consistency diet w/ well-Cut meats, moistened foods (more mech soft in nature); Thin liquids VIA CUP for increased control and safer swallowing -- NO STRAWS! Recommend general aspiration precautions including Reducing Distractions and Talking during meals.  Medication Administration: Whole meds with puree (for safer swallowing)    Other  Recommendations Recommended Consults:  (Dietician f/u) Oral Care Recommendations: Oral care BID;Oral care before and after PO;Staff/trained caregiver to provide oral care Other Recommendations:  (n/a)   Follow up Recommendations None (TBD)      Frequency and Duration min 2x/week  1 week       Prognosis Prognosis for Safe Diet Advancement: Fair (-Good) Barriers to Reach Goals: Cognitive deficits;Time post onset;Behavior;Severity of deficits Barriers/Prognosis Comment: unsure of pt's Baseline Cognitive status and carry-over of information and precautions      Swallow Study   General Date of Onset: 07/12/20 HPI: Pt is a 80 y.o. 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  She also admits to dysuria and urinary frequency  without hematuria or flank pain.  She has been having mild neck pain and stiffness without headache or dizziness or blurred vision..  She reportedly had a fever of 102 per EMS and heart rate of 120 and was having suprapubic abdominal pressure.  Urine cultures drawn w/ "urine culture Growing Serratia Liquefaciens"  CXR: "Questionable small opacity at the RIGHT lung base. This could  represent pneumonia, aspiration or atelectasis".    Of note, pt is on Aricept -- unsure of Baseline Cognitive status. Type of Study: Bedside Swallow Evaluation Previous Swallow Assessment: none Diet Prior to this Study: Regular;Thin liquids Temperature Spikes Noted: No (wbc 13.2 trending down) Respiratory Status: Room air History of Recent Intubation: No Behavior/Cognition: Alert;Cooperative;Pleasant mood;Distractible;Requires cueing (unsure of Baseline Cognitive status -- on Aricept) Oral Cavity Assessment: Within Functional Limits Oral Care Completed by SLP: Recent completion by staff Oral Cavity - Dentition: Dentures, top;Dentures, bottom (now in place) Vision: Functional for self-feeding Self-Feeding Abilities: Able to feed self;Needs assist;Needs set up Patient Positioning: Upright in bed (needed min positioning support upright) Baseline Vocal Quality: Normal Volitional Cough: Strong Volitional Swallow: Able to elicit    Oral/Motor/Sensory Function Overall Oral Motor/Sensory Function: Within functional limits   Ice Chips Ice chips: Within functional limits Presentation: Spoon (fed; 2 trials)   Thin Liquid Thin Liquid: Impaired Presentation: Cup;Self Fed (12+ trials) Oral Phase Impairments:  (none) Pharyngeal  Phase Impairments: Cough - Immediate (x1/12+ trials) Other Comments: pt took multiple sips that episode    Nectar Thick Nectar Thick Liquid: Not tested  Honey Thick Honey Thick Liquid: Not tested   Puree Puree: Within functional limits Presentation: Self Fed;Spoon (~2-3 ozs)   Solid     Solid:  Within functional limits (soft solids) Presentation: Self Fed;Spoon (chicken noodle soup -- full bowl) Other Comments: also tolerated a mixed consistency        [name], MS, [employer] Speech Language Pathologist Rehab Services [phone] Analynn Daum 07/14/2020,4:20 PM

## 2020-07-14 NOTE — Evaluation (Signed)
Occupational Therapy Evaluation Patient Details Name: Christy [name] MRN: [id number] DOB: Feb 07, 1941 Today's Date: 07/14/2020    History of Present Illness Per MD notes: Pt is an 80 y.o. frail female with medical history significant for coronary artery disease, dementia, essential hypertension, lung cancer status post right upper lobectomy, depression and anxiety, who presented to the emergency room from her skilled nursing facility with acute onset of generalized weakness with associated congested cough as well as occasional wheezing and associated dyspnea.  MD assessment includes: Sepsis secondary to pneumonia and hypokalemia.  Of note pt with neck pain since recent fall at ALF.  Per radialogy impression "No acute findings in the cervical spine. Please note CT is more sensitive for cervical spine fracture."   Clinical Impression   Patient presenting with decreased I in self care, balance, functional mobility/transfers, endurance, and safety awareness.  Per chart review and pt's report she lives at an ALF with her husband PTA. She ambulates with RW with supervision/SBA and an aide assists her with self care tasks at baseline.  Patient currently functioning at mod - max A overall. Pt with some learned helplessness this session and needing encouragement to even reach to obtain items from lap tray in front of her. Pt is very pleasant and cooperative throughout. Pt with c/o back and neck pain and was agreeable to change position by coming to EOB with mod A and sitting for ~ 8 minute with  min A for sitting balance while combing hair and washing face. Posterior bias much worse with functional tasks no unsupported by UEs. Mod A to stand from bed.  Patient will benefit from acute OT to increase overall independence in the areas of ADLs, functional mobility, and safety awareness in order to safely discharge to next venue of care.    Follow Up Recommendations  SNF;Supervision/Assistance - 24 hour     Equipment Recommendations  Other (comment) (defer to next venue of care)       Precautions / Restrictions Precautions Precautions: Fall Restrictions Weight Bearing Restrictions: No      Mobility Bed Mobility Overal bed mobility: Needs Assistance Bed Mobility: Supine to Sit;Sit to Supine Rolling: Supervision   Supine to sit: Mod assist Sit to supine: Mod assist   General bed mobility comments: Mod A for both BLE as well as trunk control with min cuing for technique and hand placement    Transfers Overall transfer level: Needs assistance Equipment used: Rolling walker (2 wheeled) Transfers: Sit to/from Stand Sit to Stand: Mod assist        Lateral/Scoot Transfers: Supervision General transfer comment: Mod cues for sequencing for hand placement and increased trunk flexion and mod A to come to full upright position and to prevent posterior LOB once in standing    Balance Overall balance assessment: Needs assistance;History of Falls Sitting-balance support: Feet unsupported;Single extremity supported Sitting balance-Leahy Scale: Poor Sitting balance - Comments: cuing for anterior weight shift Postural control: Posterior lean Standing balance support: Bilateral upper extremity supported;During functional activity Standing balance-Leahy Scale: Poor Standing balance comment: Frequent Mod A to prevent post LOB                           ADL either performed or assessed with clinical judgement   ADL Overall ADL's : Needs assistance/impaired Eating/Feeding: Set up;Bed level   Grooming: Wash/dry hands;Wash/dry face;Set up;Sitting;Min guard  Toilet Transfer: Moderate assistance;BSC Toilet Transfer Details (indicate cue type and reason): simulated                 Vision Patient Visual Report: No change from baseline              Pertinent Vitals/Pain Pain Assessment: Faces Faces Pain Scale: Hurts little more Pain Location:  back Pain Descriptors / Indicators: Aching;Discomfort Pain Intervention(s): Limited activity within patient's tolerance;Monitored during session;Repositioned     Hand Dominance Right   Extremity/Trunk Assessment Upper Extremity Assessment Upper Extremity Assessment: Generalized weakness   Lower Extremity Assessment Lower Extremity Assessment: Generalized weakness       Communication Communication Communication: No difficulties   Cognition Arousal/Alertness: Awake/alert Behavior During Therapy: WFL for tasks assessed/performed Overall Cognitive Status: History of cognitive impairments - at baseline                                 General Comments: Pt very pleasant and cooperative this session.         Shoulder Instructions      Home Living Family/patient expects to be discharged to:: Assisted living                             Home Equipment: Gilford Rile - 2 wheels;Shower seat - built in;Grab bars - toilet          Prior Functioning/Environment Level of Independence: Needs assistance  Gait / Transfers Assistance Needed: SBA with ambulation with a RW in her ALF room, able to ambulate down hall of ALF with PT but used a w/c for facility mobility with staff; Pt endorses 4 falls in the last 6 months secondary to legs buckling and/or general LOB ADL's / Homemaking Assistance Needed: Assist from ALF staff with all ADLs, meals, meds, etc..            OT Problem List: Decreased strength;Impaired balance (sitting and/or standing);Decreased cognition;Decreased knowledge of precautions;Pain;Decreased range of motion;Decreased safety awareness;Cardiopulmonary status limiting activity;Decreased activity tolerance;Decreased knowledge of use of DME or AE      OT Treatment/Interventions: Self-care/ADL training;Manual therapy;Therapeutic exercise;Patient/family education;Neuromuscular education;Balance training;Energy conservation;Therapeutic activities;DME  and/or AE instruction    OT Goals(Current goals can be found in the care plan section) Acute Rehab OT Goals Patient Stated Goal: To get stronger and go home OT Goal Formulation: With patient Time For Goal Achievement: 07/28/20 Potential to Achieve Goals: Fair ADL Goals Pt Will Perform Grooming: with modified independence;standing Pt Will Perform Lower Body Dressing: with supervision;sit to/from stand Pt Will Transfer to Toilet: ambulating;with supervision Pt Will Perform Toileting - Clothing Manipulation and hygiene: sit to/from stand  OT Frequency: Min 2X/week   Barriers to D/C:    unsure husband can assist her at this level of care.          AM-PAC OT "6 Clicks" Daily Activity     Outcome Measure Help from another person eating meals?: A Little Help from another person taking care of personal grooming?: A Little Help from another person toileting, which includes using toliet, bedpan, or urinal?: A Lot Help from another person bathing (including washing, rinsing, drying)?: A Lot Help from another person to put on and taking off regular upper body clothing?: A Little Help from another person to put on and taking off regular lower body clothing?: A Lot 6 Click Score: 15   End of Session Equipment  Utilized During Treatment: Surveyor, mining Communication: Mobility status  Activity Tolerance: Patient tolerated treatment well Patient left: in bed;with call bell/phone within reach;with bed alarm set  OT Visit Diagnosis: Unsteadiness on feet (R26.81);Repeated falls (R29.6);Muscle weakness (generalized) (M62.81);Pain Pain - part of body:  (back and neck)                Time: 0301-3143 OT Time Calculation (min): 24 min Charges:  OT General Charges $OT Visit: 1 Visit OT Evaluation $OT Eval Low Complexity: 1 Low OT Treatments $Self Care/Home Management : 8-22 mins  Darleen Crocker, MS, OTR/L , CBIS ascom [phone]  07/14/20, 11:23 AM

## 2020-07-14 NOTE — NC FL2 (Signed)
[place] LEVEL OF CARE SCREENING TOOL     IDENTIFICATION  Patient Name: Christy [name] Birthdate: 1940/11/27 Sex: female Admission Date (Current Location): 07/12/2020  [place] and [place] Number:  Engineering geologist and Address:  [facility], [address] [place], [place] [address]      Provider Number: [id number]  Attending Physician Name and Address:  Elmarie Shiley, MD  Relative Name and Phone Number:  Garfield Cornea (daughter) [phone]    Current Level of Care: Hospital Recommended Level of Care: [place] Prior Approval Number:    Date Approved/Denied:   PASRR Number: [id number]  Discharge Plan: SNF    Current Diagnoses: Patient Active Problem List   Diagnosis Date Noted  . Ataxia 07/24/2019  . Confusion 07/24/2019  . Sepsis ([place]) 05/22/2019  . Sepsis secondary to UTI ([place]) 05/22/2019  . Hypotension 05/22/2019  . Acute kidney injury superimposed on CKD ([place]) 05/22/2019  . Compression fracture of body of thoracic vertebra ([place]) 05/04/2019  . Compression fracture of C-spine ([place]) 05/04/2019  . Fall 05/03/2019  . Anxiety, generalized 03/19/2019  . Exertional shortness of breath 10/03/2018  . Heart palpitations 10/02/2018  . Mild cognitive impairment 03/28/2018  . Aortic atherosclerosis ([place]) 02/24/2018  . 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 Cystocele, midline 04/28/2017  . Recurrent major depressive disorder, in partial remission ([place]) 03/17/2017  . Neuropathy 03/01/2017  . Lumbosacral radiculopathy 02/16/2017  . Bilateral pain of leg and foot 12/16/2016  . Fatigue 12/16/2016  . Trochanteric bursitis of both hips 12/16/2016  . Weakness of both legs 12/16/2016  . Stress incontinence of urine 10/15/2014  . Polypharmacy 09/02/2014  . Primary cancer of right upper lobe of lung ([place]) 07/19/2014  . Acid reflux 07/18/2014  . 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 Osteoporosis, post-menopausal  07/18/2014  . Chronic kidney disease (CKD), stage III (moderate) ([place]) 08/30/2013  . Benign essential HTN 08/30/2013  . Fatty liver disease, nonalcoholic 16/08/3708  . Fibrositis 08/30/2013  . Hot flash, menopausal 08/30/2013  . Billowing mitral valve 08/30/2013  . Allergic rhinitis, seasonal 08/30/2013  . Fibromyalgia 08/30/2013    Orientation RESPIRATION BLADDER Height & Weight     Self,Time,Situation,Place  Normal External catheter Weight: 68 kg Height:  5\' 6"  (167.6 cm)  BEHAVIORAL SYMPTOMS/MOOD NEUROLOGICAL BOWEL NUTRITION STATUS      Continent Diet (soft diet - supervision with meals)  AMBULATORY STATUS COMMUNICATION OF NEEDS Skin   Limited Assist Verbally Normal                       Personal Care Assistance Level of Assistance  Bathing,Feeding,Dressing Bathing Assistance: Limited assistance Feeding assistance: Limited assistance Dressing Assistance: Limited assistance     Functional Limitations Info             SPECIAL CARE FACTORS FREQUENCY  PT (By licensed PT),OT (By licensed OT)     PT Frequency: 5 times per week OT Frequency: 5 times per week            Contractures Contractures Info: Not present    Additional Factors Info  Code Status,Allergies Code Status Info: Full` Allergies Info: Alendronate, nitrofurantojunn, sulfa, cefdinir, cipro, diphenhydramine, lactose           Current Medications (07/14/2020):  This is the current hospital active medication list Current Facility-Administered Medications  Medication Dose Route Frequency Provider Last Rate Last Admin  . acetaminophen (TYLENOL) tablet 650 mg  650 mg Oral Q6H PRN Mansy,  Jan A, MD   650 mg at 07/14/20 0901   Or  . acetaminophen (TYLENOL) suppository 650 mg  650 mg Rectal Q6H PRN Mansy, Jan A, MD      . azelastine (ASTELIN) 0.1 % nasal spray 2 spray  2 spray Each Nare BID PRN Mansy, Jan A, MD      . benazepril (LOTENSIN) tablet 5 mg  5 mg Oral Daily Mansy, Jan A, MD   5 mg at  07/14/20 0905  . cholecalciferol (VITAMIN D3) tablet 1,000 Units  1,000 Units Oral Daily Mansy, Jan A, MD   1,000 Units at 07/14/20 0901  . diclofenac Sodium (VOLTAREN) 1 % topical gel 2 g  2 g Topical QID Regalado, Belkys A, MD      . donepezil (ARICEPT) tablet 5 mg  5 mg Oral QHS Mansy, Jan A, MD   5 mg at 07/13/20 2039  . DULoxetine (CYMBALTA) DR capsule 20 mg  20 mg Oral Daily Regalado, Belkys A, MD      . enoxaparin (LOVENOX) injection 40 mg  40 mg Subcutaneous Q24H Mansy, Jan A, MD   40 mg at 07/14/20 0902  . estradiol (ESTRACE) tablet 1 mg  1 mg Oral Daily Mansy, Jan A, MD   1 mg at 07/14/20 0905  . feeding supplement (ENSURE ENLIVE / ENSURE PLUS) liquid 237 mL  237 mL Oral BID BM Regalado, Belkys A, MD   237 mL at 07/14/20 1410  . guaiFENesin (MUCINEX) 12 hr tablet 600 mg  600 mg Oral BID Mansy, Jan A, MD   600 mg at 07/14/20 0902  . guaiFENesin-dextromethorphan (ROBITUSSIN DM) 100-10 MG/5ML syrup 5 mL  5 mL Oral Q4H PRN Regalado, Belkys A, MD      . ipratropium-albuterol (DUONEB) 0.5-2.5 (3) MG/3ML nebulizer solution 3 mL  3 mL Nebulization TID Regalado, Belkys A, MD   3 mL at 07/14/20 1359  . magnesium hydroxide (MILK OF MAGNESIA) suspension 30 mL  30 mL Oral Daily PRN Mansy, Arvella Merles, MD      . multivitamin-lutein [facility]) capsule   Oral Daily Mansy, Arvella Merles, MD   1 capsule at 07/14/20 0905  . nystatin (MYCOSTATIN) 100000 UNIT/ML suspension 500,000 Units  5 mL Oral QID Regalado, Belkys A, MD   500,000 Units at 07/14/20 1410  . ondansetron (ZOFRAN) tablet 4 mg  4 mg Oral Q6H PRN Mansy, Jan A, MD       Or  . ondansetron [facility]) injection 4 mg  4 mg Intravenous Q6H PRN Mansy, Jan A, MD      . piperacillin-tazobactam (ZOSYN) IVPB 3.375 g  3.375 g Intravenous Q8H Renda Rolls, RPH 12.5 mL/hr at 07/14/20 1413 3.375 g at 07/14/20 1413  . vitamin B-12 (CYANOCOBALAMIN) tablet 1,000 mcg  1,000 mcg Oral Daily Mansy, Jan A, MD   1,000 mcg at 07/14/20 0902     Discharge  Medications: Please see discharge summary for a list of discharge medications.  Relevant Imaging Results:  Relevant Lab Results:   Additional Information SS# [id number]  Shelbie Hutching, RN

## 2020-07-14 NOTE — TOC Initial Note (Signed)
Transition of Care [facility]) - Initial/Assessment Note    Patient Details  Name: Christy [name] MRN: [id number] Date of Birth: 1940/04/12  Transition of Care [facility]) CM/SW Contact:    Shelbie Hutching, RN Phone Number: 07/14/2020, 1:37 PM  Clinical Narrative:                 Patient admitted to the hospital with sepsis.  RNCM met with patient and patient's daughter at the bedside.  Patient is from [place], her husband lives there with her and they share a room.  Patient has been to [facility] for about a month.  Patient has a RW and wheelchair at the facility.  Patient like to walk but has had a couple of falls since being at [facility].  PT and OT are recommending SNF, patient and family agrees.  First preference is [facility].  Bed search and SNF workup started.   Expected Discharge Plan: Skilled Nursing Facility Barriers to Discharge: Continued Medical Work up   Patient Goals and CMS Choice Patient states their goals for this hospitalization and ongoing recovery are:: Patient wants to get out of the hospital CMS Medicare.gov Compare Post Acute Care list provided to:: Patient Choice offered to / list presented to : [place]  Expected Discharge Plan and Services Expected Discharge Plan: [place]   Discharge Planning Services: CM Consult Post Acute Care Choice: [place] Living arrangements for the past 2 months: [place]                 DME Arranged: N/A DME Agency: NA       HH Arranged: NA          Prior Living Arrangements/Services Living arrangements for the past 2 months: [place] Lives with:: Facility Resident,Spouse Patient language and need for interpreter reviewed:: Yes Do you feel safe going back to the place where you live?: Yes      Need for Family Participation in Patient Care: Yes (Comment) Care giver support system in place?: Yes (comment) (daughter) Current home services:  DME (walker and wheelchair) Criminal Activity/Legal Involvement Pertinent to Current Situation/Hospitalization: No - Comment as needed  Activities of Daily Living Home Assistive Devices/Equipment: Cytogeneticist (specify type) ADL Screening (condition at time of admission) Patient's cognitive ability adequate to safely complete daily activities?: Yes Is the patient deaf or have difficulty hearing?: No Does the patient have difficulty seeing, even when wearing glasses/contacts?: No Does the patient have difficulty concentrating, remembering, or making decisions?: No Patient able to express need for assistance with ADLs?: Yes Does the patient have difficulty dressing or bathing?: Yes Independently performs ADLs?: No Communication: Independent Dressing (OT): Needs assistance Is this a change from baseline?: Pre-admission baseline Grooming: Needs assistance Is this a change from baseline?: Pre-admission baseline Feeding: Independent Bathing: Needs assistance Is this a change from baseline?: Pre-admission baseline Toileting: Needs assistance Is this a change from baseline?: Pre-admission baseline In/Out Bed: Needs assistance Is this a change from baseline?: Pre-admission baseline Walks in Home: Needs assistance Is this a change from baseline?: Pre-admission baseline Does the patient have difficulty walking or climbing stairs?: Yes Weakness of Legs: Both Weakness of Arms/Hands: Both  Permission Sought/Granted Permission sought to share information with : Case Manager,Family Chief Financial Officer Permission granted to share information with : Yes, Verbal Permission Granted  Share Information with NAME: Diane  Permission granted to share info w AGENCY: SNFs  Permission granted to share info w Relationship: daughter  Emotional Assessment Appearance:: Appears stated age Attitude/Demeanor/Rapport: Engaged Affect (typically observed): Accepting Orientation: :  Oriented to Self,Oriented to Place,Oriented to  Time,Oriented to Situation Alcohol / Substance Use: Not Applicable Psych Involvement: No (comment)  Admission diagnosis:  Generalized weakness [R53.1] Sepsis ([place]) [A41.9] Sepsis, due to unspecified organism, unspecified whether acute organ dysfunction present [facility]) [A41.9] Patient Active Problem List   Diagnosis Date Noted  . Ataxia 07/24/2019  . Confusion 07/24/2019  . Sepsis ([place]) 05/22/2019  . Sepsis secondary to UTI ([place]) 05/22/2019  . Hypotension 05/22/2019  . Acute kidney injury superimposed on CKD ([place]) 05/22/2019  . Compression fracture of body of thoracic vertebra ([place]) 05/04/2019  . Compression fracture of C-spine ([place]) 05/04/2019  . Fall 05/03/2019  . Anxiety, generalized 03/19/2019  . Exertional shortness of breath 10/03/2018  . Heart palpitations 10/02/2018  . Mild cognitive impairment 03/28/2018  . Aortic atherosclerosis ([place]) 02/24/2018  . 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 Cystocele, midline 04/28/2017  . Recurrent major depressive disorder, in partial remission ([place]) 03/17/2017  . Neuropathy 03/01/2017  . Lumbosacral radiculopathy 02/16/2017  . Bilateral pain of leg and foot 12/16/2016  . Fatigue 12/16/2016  . Trochanteric bursitis of both hips 12/16/2016  . Weakness of both legs 12/16/2016  . Stress incontinence of urine 10/15/2014  . Polypharmacy 09/02/2014  . Primary cancer of right upper lobe of lung ([place]) 07/19/2014  . Acid reflux 07/18/2014  . 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 Chronic kidney disease (CKD), stage III (moderate) ([place]) 08/30/2013  . Benign essential HTN 08/30/2013  . Fatty liver disease, nonalcoholic 43/83/8184  . Fibrositis 08/30/2013  . Hot flash, menopausal 08/30/2013  . Billowing mitral valve 08/30/2013  . Allergic rhinitis, seasonal 08/30/2013  . Fibromyalgia 08/30/2013   PCP:  Ezequiel Kayser, MD Pharmacy:   [facility], [address]. [place] [address] Phone: [phone] Fax: [place], [place] - [place] [address] AT [address] [facility] [place] [address] Phone: [phone] Fax: [phone]     Social Determinants of Health (SDOH) Interventions    Readmission Risk Interventions No flowsheet data found.

## 2020-07-14 NOTE — Progress Notes (Signed)
PROGRESS NOTE    Christy [name]  YQM:[id number] DOB: April 14, 1940 DOA: 07/12/2020 PCP: Ezequiel Kayser, MD   Brief Narrative: 80 year old with past medical history significant for CAD, hypertension, lung cancer status post right upper lobectomy, depression anxiety who presents to the ED complaining of acute onset of generalized weakness associated with cough, occasional wheezing and associated dyspnea.  Patient also reports dysuria and urinary frequency.  She also complained of neck pain.  Evaluation in the ED; patient was found to be tachycardic tachypnea respiration rate 25, leukocytosis 16, COVID PCR negative.  Chest x-ray questionable small opacity right lung base that may represent pneumonia, aspiration or atelectasis.    Assessment & Plan:   Active Problems:   Sepsis ([place])  1-Sepsis secondary to PNA and UTI: Present On admission. -Patient presents with tachycardia, tachypnea respiration rate 25, leukocytosis.  Chest x-ray with opacity right lung base. -Continue with Zosyn. Follow sensitivity.  -Blood culture: No growth to date. , urine culture Growing Serratia Liquefaciens. .  -Sputum Culture ordered.  -Strep Pneumonia  Positive and legionella antigen ordered.  -WBC trending down.  -Speech evaluation, due to chocking episode.   2-Hypertension: Continue with benazepril  3-Depression, dementia: Continue with Aricept, Cymbalta. Discontinue Remeron, per family patient gets very sleepy with Remeron.    4-B12 deficiency: Continue with B12 supplement  5-Oral candidiasis;  Started Nystatin.   6-Hypokalemia; replete orally.  7-Neck pain; daughter report patient has been complaining of worsening neck pain since recent fall at ALF last week. Cervical Spine X ray; negative for fracture.   Estimated body mass index is 24.2 kg/m as calculated from the following:   Height as of this encounter: 5\' 6"  (1.676 m).   Weight as of this encounter: 68 kg.   DVT prophylaxis:  Lovenox Code Status: Partial Code, no intubation. Family Communication: Daughter updated 6/06.  Disposition Plan:  Status is: Inpatient  Remains inpatient appropriate because:IV treatments appropriate due to intensity of illness or inability to take PO   Dispo: The patient is from: SNF              Anticipated d/c is to: SNF              Patient currently is not medically stable to d/c.   Difficult to place patient No        Consultants:   None  Procedures:   None  Antimicrobials:  Zosyn 6/05 Azithro 6/04  Subjective: She is feeling better. neck pain improved, report soreness.    Objective: Vitals:   07/14/20 0000 07/14/20 0521 07/14/20 0811 07/14/20 1359  BP: 124/69 (!) 163/84    Pulse: 93 74    Resp: 20 16    Temp: 97.8 F (36.6 C) 97.8 F (36.6 C)    TempSrc: Oral     SpO2: 92% 99% 96% 95%  Weight:      Height:        Intake/Output Summary (Last 24 hours) at 07/14/2020 1427 Last data filed at 07/14/2020 1354 Gross per 24 hour  Intake 60 ml  Output 825 ml  Net -765 ml   Filed Weights   07/12/20 2112 07/13/20 0135  Weight: 64.9 kg 68 kg    Examination:  General exam: NAD Respiratory system: CTA Cardiovascular system: S1, S 2 RRR Gastrointestinal system: BS present, soft, nt Central nervous system: Alert, follows command Extremities: Symmetrci power    Data Reviewed: I have personally reviewed following labs and imaging studies  CBC: Recent Labs  Lab 07/12/20  2133 07/13/20 0600 07/14/20 0640  WBC 16.6* 19.2* 13.2*  NEUTROABS 14.7*  --   --   HGB 13.9 12.5 11.6*  HCT 43.1 38.2 35.6*  MCV 92.7 93.4 94.4  PLT 230 191 767   Basic Metabolic Panel: Recent Labs  Lab 07/12/20 2133 07/13/20 0600 07/14/20 0640  NA 140 140 140  K 3.6 3.4* 4.5  CL 104 107 108  CO2 26 25 26   GLUCOSE 126* 120* 102*  BUN 15 13 14   CREATININE 0.83 0.87 1.00  CALCIUM 9.3 8.4* 8.7*   GFR: Estimated Creatinine Clearance: 41.3 mL/min (by C-G formula  based on SCr of 1 mg/dL). Liver Function Tests: Recent Labs  Lab 07/12/20 2133  AST 23  ALT 14  ALKPHOS 77  BILITOT 1.1  PROT 7.3  ALBUMIN 3.7   No results for input(s): LIPASE, AMYLASE in the last 168 hours. No results for input(s): AMMONIA in the last 168 hours. Coagulation Profile: Recent Labs  Lab 07/13/20 0556  INR 1.1   Cardiac Enzymes: No results for input(s): CKTOTAL, CKMB, CKMBINDEX, TROPONINI in the last 168 hours. BNP (last 3 results) No results for input(s): PROBNP in the last 8760 hours. HbA1C: No results for input(s): HGBA1C in the last 72 hours. CBG: No results for input(s): GLUCAP in the last 168 hours. Lipid Profile: No results for input(s): CHOL, HDL, LDLCALC, TRIG, CHOLHDL, LDLDIRECT in the last 72 hours. Thyroid Function Tests: No results for input(s): TSH, T4TOTAL, FREET4, T3FREE, THYROIDAB in the last 72 hours. Anemia Panel: No results for input(s): VITAMINB12, FOLATE, FERRITIN, TIBC, IRON, RETICCTPCT in the last 72 hours. Sepsis Labs: Recent Labs  Lab 07/12/20 2119 07/13/20 0600  PROCALCITON  --  0.11  LATICACIDVEN 1.1  --     Recent Results (from the past 240 hour(s))  Blood Culture (routine x 2)     Status: None (Preliminary result)   Collection Time: 07/12/20  9:33 PM   Specimen: BLOOD  Result Value Ref Range Status   Specimen Description BLOOD BLOOD RIGHT HAND  Final   Special Requests   Final    BOTTLES DRAWN AEROBIC AND ANAEROBIC Blood Culture adequate volume   Culture   Final    NO GROWTH 2 DAYS Performed at [facility], [address], [place], [place] [address]    Report Status PENDING  Incomplete  Blood Culture (routine x 2)     Status: None (Preliminary result)   Collection Time: 07/12/20  9:38 PM   Specimen: BLOOD  Result Value Ref Range Status   Specimen Description BLOOD RIGHT ANTECUBITAL  Final   Special Requests   Final    BOTTLES DRAWN AEROBIC AND ANAEROBIC Blood Culture results may not be optimal due to  an inadequate volume of blood received in culture bottles   Culture   Final    NO GROWTH 2 DAYS Performed at [facility], [address] [place], [place] [address]    Report Status PENDING  Incomplete  Resp Panel by RT-PCR (Flu A&B, Covid) Nasopharyngeal Swab     Status: None   Collection Time: 07/12/20  9:40 PM   Specimen: Nasopharyngeal Swab; Nasopharyngeal(NP) swabs in vial transport medium  Result Value Ref Range Status   SARS Coronavirus 2 by RT PCR NEGATIVE NEGATIVE Final    Comment: (NOTE) SARS-CoV-2 target nucleic acids are NOT DETECTED.  The SARS-CoV-2 RNA is generally detectable in upper respiratory specimens during the acute phase of infection. The lowest concentration of SARS-CoV-2 viral copies this  assay can detect is 138 copies/mL. A negative result does not preclude SARS-Cov-2 infection and should not be used as the sole basis for treatment or other patient management decisions. A negative result may occur with  improper specimen collection/handling, submission of specimen other than nasopharyngeal swab, presence of viral mutation(s) within the areas targeted by this assay, and inadequate number of viral copies(<138 copies/mL). A negative result must be combined with clinical observations, patient history, and epidemiological information. The expected result is Negative.  Fact Sheet for Patients:  EntrepreneurPulse.com.au  Fact Sheet for Healthcare Providers:  IncredibleEmployment.be  This test is no t yet approved or cleared by the [place] FDA and  has been authorized for detection and/or diagnosis of SARS-CoV-2 by FDA under an Emergency Use Authorization (EUA). This EUA will remain  in effect (meaning this test can be used) for the duration of the COVID-19 declaration under Section 564(b)(1) of the Act, 21 U.S.C.section 360bbb-3(b)(1), unless the authorization is terminated  or revoked sooner.        Influenza A by PCR NEGATIVE NEGATIVE Final   Influenza B by PCR NEGATIVE NEGATIVE Final    Comment: (NOTE) The Xpert Xpress SARS-CoV-2/FLU/RSV plus assay is intended as an aid in the diagnosis of influenza from Nasopharyngeal swab specimens and should not be used as a sole basis for treatment. Nasal washings and aspirates are unacceptable for Xpert Xpress SARS-CoV-2/FLU/RSV testing.  Fact Sheet for Patients: EntrepreneurPulse.com.au  Fact Sheet for Healthcare Providers: IncredibleEmployment.be  This test is not yet approved or cleared by the [place] FDA and has been authorized for detection and/or diagnosis of SARS-CoV-2 by FDA under an Emergency Use Authorization (EUA). This EUA will remain in effect (meaning this test can be used) for the duration of the COVID-19 declaration under Section 564(b)(1) of the Act, 21 U.S.C. section 360bbb-3(b)(1), unless the authorization is terminated or revoked.  Performed at [facility], [address] [place], [place] [address]   Urine culture     Status: Abnormal (Preliminary result)   Collection Time: 07/12/20 10:18 PM   Specimen: In/Out Cath Urine  Result Value Ref Range Status   Specimen Description   Final    IN/OUT CATH URINE Performed at [facility], [address] [place], [place] [address]    Special Requests   Final    NONE Performed at [facility], [address] [place], [place] [address]    Culture >=100,000 COLONIES/mL SERRATIA LIQUEFACIENS (A)  Final   Report Status PENDING  Incomplete         Radiology Studies: DG Cervical Spine 2 or 3 views  Result Date: 07/13/2020 CLINICAL DATA:  Neck pain since recent fall EXAM: CERVICAL SPINE - 2-3 VIEW COMPARISON:  Cervical spine CT May 03, 2019. FINDINGS: Six cervical type vertebral bodies are well visualized on the lateral view. There is no evidence of cervical spine fracture or prevertebral soft  tissue swelling. Similar extension weighted cervical lordosis. No evidence of listhesis. The dens and lateral masses appear intact. No other significant bone abnormalities are identified. No thoracic vertebral augmentation. IMPRESSION: No acute findings in the cervical spine. Please note CT is more sensitive for cervical spine fracture. Electronically Signed   By: Dahlia Bailiff MD   On: 07/13/2020 15:48   DG Chest Port 1 View  Result Date: 07/12/2020 CLINICAL DATA:  Questionable sepsis.  Generalized weakness. EXAM: PORTABLE CHEST 1 VIEW COMPARISON:  Chest x-ray dated 05/03/2019 and 12/05/2016. FINDINGS: Questionable small opacity at the  RIGHT lung base. Lungs otherwise clear. No pleural effusion or pneumothorax is seen. Heart size and mediastinal contours are stable. No acute appearing osseous abnormality. IMPRESSION: Questionable small opacity at the RIGHT lung base. This could represent pneumonia, aspiration or atelectasis. Electronically Signed   By: Franki Cabot M.D.   On: 07/12/2020 22:11        Scheduled Meds: . benazepril  5 mg Oral Daily  . cholecalciferol  1,000 Units Oral Daily  . diclofenac Sodium  2 g Topical QID  . donepezil  5 mg Oral QHS  . DULoxetine  20 mg Oral Daily  . enoxaparin (LOVENOX) injection  40 mg Subcutaneous Q24H  . estradiol  1 mg Oral Daily  . feeding supplement  237 mL Oral BID BM  . guaiFENesin  600 mg Oral BID  . ipratropium-albuterol  3 mL Nebulization TID  . multivitamin-lutein   Oral Daily  . nystatin  5 mL Oral QID  . vitamin B-12  1,000 mcg Oral Daily   Continuous Infusions: . azithromycin 500 mg (07/13/20 2224)  . piperacillin-tazobactam (ZOSYN)  IV 3.375 g (07/14/20 1413)     LOS: 2 days    Time spent: 35 minutes.     Elmarie Shiley, MD [facility]   If 7PM-7AM, please contact night-coverage [email or web address]  07/14/2020, 2:27 PM

## 2020-07-15 DIAGNOSIS — J181 Lobar pneumonia, unspecified organism: Secondary | ICD-10-CM

## 2020-07-15 DIAGNOSIS — T839XXA Unspecified complication of genitourinary prosthetic device, implant and graft, initial encounter: Secondary | ICD-10-CM

## 2020-07-15 LAB — URINE CULTURE: Culture: >=100000 — AB

## 2020-07-15 LAB — BASIC METABOLIC PANEL
Anion gap: 12 (ref 5–15)
BUN: 16 mg/dL (ref 8–23)
CO2: 22 mmol/L (ref 22–32)
Calcium: 8.5 mg/dL — ABNORMAL LOW (ref 8.9–10.3)
Chloride: 104 mmol/L (ref 98–111)
Creatinine, Ser: 0.91 mg/dL (ref 0.44–1.00)
GFR, Estimated: >60 mL/min (ref >60)
Glucose, Bld: 94 mg/dL (ref 70–99)
Potassium: 4.1 mmol/L (ref 3.5–5.1)
Sodium: 138 mmol/L (ref 135–145)

## 2020-07-15 LAB — CBC
HCT: 34.6 % — ABNORMAL LOW (ref 36.0–46.0)
Hemoglobin: 11.3 g/dL — ABNORMAL LOW (ref 12.0–15.0)
MCH: 30.5 pg (ref 26.0–34.0)
MCHC: 32.7 g/dL (ref 30.0–36.0)
MCV: 93.3 fL (ref 80.0–100.0)
Platelets: 192 10*3/uL (ref 150–400)
RBC: 3.71 MIL/uL — ABNORMAL LOW (ref 3.87–5.11)
RDW: 13.2 % (ref 11.5–15.5)
WBC: 11.3 10*3/uL — ABNORMAL HIGH (ref 4.0–10.5)
nRBC: 0 % (ref 0.0–0.2)

## 2020-07-15 LAB — SARS CORONAVIRUS 2 (TAT 6-24 HRS): SARS Coronavirus 2: NEGATIVE

## 2020-07-15 LAB — LEGIONELLA PNEUMOPHILA SEROGP 1 UR AG: L. pneumophila Serogp 1 Ur Ag: NEGATIVE

## 2020-07-15 MED ORDER — FLUCONAZOLE 50 MG PO TABS
150.0000 mg | ORAL_TABLET | Freq: Once | ORAL | Status: AC
  Administered 2020-07-15: 150 mg via ORAL
  Filled 2020-07-15: qty 1

## 2020-07-15 MED ORDER — ACETAMINOPHEN 500 MG PO TABS
500.0000 mg | ORAL_TABLET | Freq: Three times a day (TID) | ORAL | Status: DC
  Administered 2020-07-15 – 2020-07-16 (×3): 500 mg via ORAL
  Filled 2020-07-15 (×3): qty 1

## 2020-07-15 MED ORDER — SODIUM CHLORIDE 0.9 % IV SOLN
2.0000 g | INTRAVENOUS | Status: DC
  Administered 2020-07-15: 2 g via INTRAVENOUS
  Filled 2020-07-15 (×2): qty 20

## 2020-07-15 NOTE — Consult Note (Signed)
NAME: Christy [name]  DOB: January 08, 1941  MRN: [id number]  Date/Time: 07/15/2020 6:14 PM  REQUESTING PROVIDER: Dr.Regalado Subjective:  REASON FOR CONSULT: Pneumonia/UTI ? Christy [name] is a 80 y.o. 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 Has yellow sputum. Vitals in the ED 98.5, HR 102, Sats 96% Labs revealed wbc 16.6, HB 13.9 and plt 230. Cr 0.83 cultires sent . CXR showed rt lung base opacity. and she was started on ceftriaxone and azithromycin- UA did not show any WBC but UC had serratia- she was switched to zosyn. I am asked to see her for serratia in UC Pt says she does not have any dysuria or pain abdomen She has a vaginal pessary for cystocele    Past Medical History:  Diagnosis Date  . Allergy   . Anxiety   . Depression   . Fatty liver disease, nonalcoholic   . GERD (gastroesophageal reflux disease)   . Heart disease   . Hypertension   . Lung cancer ([place]) 02/2014   RUL Lobectomy  . Urinary incontinence     Past Surgical History:  Procedure Laterality Date  . ABDOMINAL HYSTERECTOMY    . KYPHOPLASTY N/A 05/04/2019   Procedure: KYPHOPLASTY T7;  Surgeon: Hessie Knows, MD;  Location: [facility] ORS;  Service: Orthopedics;  Laterality: N/A;  . [place]  . LARYNGOSCOPY N/A 07/22/2014   Procedure: JET LARYNGOSCOPY with right vocal cord biopsy;  Surgeon: Margaretha Sheffield, MD;  Location: [facility] ORS;  Service: ENT;  Laterality: N/A;  . LUNG CANCER SURGERY    . NECK SURGERY      Social History   Socioeconomic History  . Marital status: Married    Spouse name: Not on file  . Number of children: Not on file  . Years of education: Not on file  . Highest education level: Not on file  Occupational History  . Not on file  Tobacco Use  . Smoking status: Former Smoker    Packs/day: 0.50    Years: 25.00    Pack years:  12.50    Types: Cigarettes    Quit date: 07/18/2006    Years since quitting: 14.0  . Smokeless tobacco: Never Used  Vaping Use  . Vaping Use: Never used  Substance and Sexual Activity  . Alcohol use: Yes    Alcohol/week: 2.0 standard drinks    Types: 1 Glasses of wine, 1 Cans of beer per week    Comment: occassionally about 4 times a year  . Drug use: No  . Sexual activity: Not Currently    Birth control/protection: Surgical  Other Topics Concern  . Not on file  Social History Narrative  . Not on file   Social Determinants of Health   Financial Resource Strain: Not on file  Food Insecurity: Not on file  Transportation Needs: Not on file  Physical Activity: Not on file  Stress: Not on file  Social Connections: Not on file  Intimate Partner Violence: Not on file    Family History  Problem Relation Age of Onset  . Kidney cancer Cousin   . Cancer Maternal Grandfather   . Cancer Maternal Grandmother   . Kidney disease Neg Hx   . Prostate cancer Neg Hx    Allergies  Allergen Reactions  . Alendronate Other (See Comments)    Esophageal burning  . Nitrofurantoin Nausea Only  and Nausea And Vomiting  . Sulfa Antibiotics Other (See Comments)  . Cefdinir   . Ciprofloxacin Other (See Comments)    Other Reaction: GI UPSET  . Diphenhydramine Other (See Comments)  . Lactose Intolerance (Gi)    I? Current Facility-Administered Medications  Medication Dose Route Frequency Provider Last Rate Last Admin  . acetaminophen (TYLENOL) tablet 650 mg  650 mg Oral Q6H PRN Mansy, Jan A, MD   650 mg at 07/14/20 0901   Or  . acetaminophen (TYLENOL) suppository 650 mg  650 mg Rectal Q6H PRN Mansy, Jan A, MD      . acetaminophen (TYLENOL) tablet 500 mg  500 mg Oral TID Regalado, Belkys A, MD   500 mg at 07/15/20 1536  . azelastine (ASTELIN) 0.1 % nasal spray 2 spray  2 spray Each Nare BID PRN Mansy, Jan A, MD      . benazepril (LOTENSIN) tablet 5 mg  5 mg Oral Daily Mansy, Jan A, MD   5 mg at  07/15/20 0914  . cefTRIAXone (ROCEPHIN) 2 g in sodium chloride 0.9 % 100 mL IVPB  2 g Intravenous Q24H Regalado, Belkys A, MD 200 mL/hr at 07/15/20 1540 2 g at 07/15/20 1540  . cholecalciferol (VITAMIN D3) tablet 1,000 Units  1,000 Units Oral Daily Mansy, Arvella Merles, MD   1,000 Units at 07/15/20 0914  . diclofenac Sodium (VOLTAREN) 1 % topical gel 2 g  2 g Topical QID Regalado, Belkys A, MD   2 g at 07/15/20 1655  . donepezil (ARICEPT) tablet 5 mg  5 mg Oral QHS Mansy, Jan A, MD   5 mg at 07/14/20 2133  . DULoxetine (CYMBALTA) DR capsule 20 mg  20 mg Oral Daily Regalado, Belkys A, MD   20 mg at 07/15/20 0915  . enoxaparin (LOVENOX) injection 40 mg  40 mg Subcutaneous Q24H Mansy, Jan A, MD   40 mg at 07/15/20 0915  . estradiol (ESTRACE) tablet 1 mg  1 mg Oral Daily Mansy, Jan A, MD   1 mg at 07/15/20 0915  . feeding supplement (ENSURE ENLIVE / ENSURE PLUS) liquid 237 mL  237 mL Oral BID BM Regalado, Belkys A, MD   237 mL at 07/15/20 1345  . guaiFENesin (MUCINEX) 12 hr tablet 600 mg  600 mg Oral BID Mansy, Jan A, MD   600 mg at 07/15/20 0914  . guaiFENesin-dextromethorphan (ROBITUSSIN DM) 100-10 MG/5ML syrup 5 mL  5 mL Oral Q4H PRN Regalado, Belkys A, MD      . ipratropium-albuterol (DUONEB) 0.5-2.5 (3) MG/3ML nebulizer solution 3 mL  3 mL Nebulization TID Regalado, Belkys A, MD   3 mL at 07/15/20 1418  . magnesium hydroxide (MILK OF MAGNESIA) suspension 30 mL  30 mL Oral Daily PRN Mansy, Arvella Merles, MD      . multivitamin-lutein [facility]) capsule   Oral Daily Mansy, Arvella Merles, MD   1 capsule at 07/15/20 0915  . nystatin (MYCOSTATIN) 100000 UNIT/ML suspension 500,000 Units  5 mL Oral QID Regalado, Belkys A, MD   500,000 Units at 07/15/20 1536  . ondansetron (ZOFRAN) tablet 4 mg  4 mg Oral Q6H PRN Mansy, Jan A, MD       Or  . ondansetron [facility]) injection 4 mg  4 mg Intravenous Q6H PRN Mansy, Jan A, MD      . traZODone (DESYREL) tablet 25 mg  25 mg Oral QHS PRN Mansy, Jan A, MD   25 mg at 07/14/20 2331  .  vitamin  B-12 (CYANOCOBALAMIN) tablet 1,000 mcg  1,000 mcg Oral Daily Mansy, Jan A, MD   1,000 mcg at 07/15/20 1610     Abtx:  Anti-infectives (From admission, onward)   Start     Dose/Rate Route Frequency Ordered Stop   07/15/20 1500  cefTRIAXone (ROCEPHIN) 2 g in sodium chloride 0.9 % 100 mL IVPB        2 g 200 mL/hr over 30 Minutes Intravenous Every 24 hours 07/15/20 1356     07/15/20 1145  fluconazole (DIFLUCAN) tablet 150 mg        150 mg Oral  Once 07/15/20 1053 07/15/20 1234   07/13/20 2200  azithromycin (ZITHROMAX) 500 mg in sodium chloride 0.9 % 250 mL IVPB  Status:  Discontinued        500 mg 250 mL/hr over 60 Minutes Intravenous Every 24 hours 07/13/20 0038 07/14/20 1432   07/13/20 0200  piperacillin-tazobactam (ZOSYN) IVPB 3.375 g  Status:  Discontinued        3.375 g 12.5 mL/hr over 240 Minutes Intravenous Every 8 hours 07/13/20 0055 07/15/20 1355   07/13/20 0045  cefTRIAXone (ROCEPHIN) 2 g in sodium chloride 0.9 % 100 mL IVPB  Status:  Discontinued        2 g 200 mL/hr over 30 Minutes Intravenous Every 24 hours 07/13/20 0038 07/13/20 0039   07/13/20 0045  piperacillin-tazobactam (ZOSYN) IVPB 3.375 g  Status:  Discontinued        3.375 g 100 mL/hr over 30 Minutes Intravenous Every 6 hours 07/13/20 0039 07/13/20 0052   07/12/20 2345  azithromycin (ZITHROMAX) 500 mg in sodium chloride 0.9 % 250 mL IVPB        500 mg 250 mL/hr over 60 Minutes Intravenous  Once 07/12/20 2334 07/13/20 0011   07/12/20 2145  cefTRIAXone (ROCEPHIN) 1 g in sodium chloride 0.9 % 100 mL IVPB  Status:  Discontinued        1 g 200 mL/hr over 30 Minutes Intravenous Every 24 hours 07/12/20 2133 07/13/20 0039      REVIEW OF SYSTEMS:  Const: negative fever, negative chills, negative weight loss Eyes: negative diplopia or visual changes, negative eye pain ENT: negative coryza, negative sore throat Resp:  cough, dyspnea Cards: negative for chest pain, palpitations, lower extremity edema GU: negative  for frequency, dysuria and hematuria, h/o incontinence GI: Negative for abdominal pain, diarrhea, bleeding, constipation Skin: negative for rash and pruritus Heme: negative for easy bruising and gum/nose bleeding MS: muscle weakness Neurolo:negative for headaches, dizziness, vertigo, memory problems  Psych: negative for feelings of anxiety, depression  Endocrine: negative for thyroid, diabetes Allergy/Immunology- GI intolerance to multiple meds Objective:  VITALS:  BP (!) 134/48 (BP Location: Right Arm)   Pulse 88   Temp 98.5 F (36.9 C)   Resp 16   Ht 5\' 6"  (1.676 m)   Wt 68 kg   SpO2 96%   BMI 24.20 kg/m  PHYSICAL EXAM:  General: Alert, cooperative, no distress, appears stated age.  Head: Normocephalic, without obvious abnormality, atraumatic. Eyes: Conjunctivae clear, anicteric sclerae. Pupils are equal ENT Nares normal. No drainage or sinus tenderness. Lips, mucosa, and tongue normal. No Thrush Neck: Supple, symmetrical, no adenopathy, thyroid: non tender no carotid bruit and no JVD. Back: No CVA tenderness. Lungs: b/l wheezing Heart: Regular rate and rhythm, no murmur, rub or gallop. Abdomen: Soft, non-tender,not distended. Bowel sounds normal. No masses Extremities: atraumatic, no cyanosis. No edema. No clubbing Skin: No rashes or lesions. Or bruising Lymph: Cervical,  supraclavicular normal. Neurologic: Grossly non-focal Pertinent Labs Lab Results CBC    Component Value Date/Time   WBC 11.3 (H) 07/15/2020 0748   RBC 3.71 (L) 07/15/2020 0748   HGB 11.3 (L) 07/15/2020 0748   HGB 10.8 (L) 05/03/2014 0411   HCT 34.6 (L) 07/15/2020 0748   HCT 33.2 (L) 05/03/2014 0411   PLT 192 07/15/2020 0748   PLT 163 05/03/2014 0411   MCV 93.3 07/15/2020 0748   MCV 92 05/03/2014 0411   MCH 30.5 07/15/2020 0748   MCHC 32.7 07/15/2020 0748   RDW 13.2 07/15/2020 0748   RDW 12.8 05/03/2014 0411   LYMPHSABS 0.8 07/12/2020 2133   LYMPHSABS 1.2 05/03/2014 0411   MONOABS 0.9  07/12/2020 2133   MONOABS 1.1 (H) 05/03/2014 0411   EOSABS 0.1 07/12/2020 2133   EOSABS 0.1 05/03/2014 0411   BASOSABS 0.1 07/12/2020 2133   BASOSABS 0.0 05/03/2014 0411    CMP Latest Ref Rng & Units 07/15/2020 07/14/2020 07/13/2020  Glucose 70 - 99 mg/dL 94 102(H) 120(H)  BUN 8 - 23 mg/dL 16 14 13   Creatinine 0.44 - 1.00 mg/dL 0.91 1.00 0.87  Sodium 135 - 145 mmol/L 138 140 140  Potassium 3.5 - 5.1 mmol/L 4.1 4.5 3.4(L)  Chloride 98 - 111 mmol/L 104 108 107  CO2 22 - 32 mmol/L 22 26 25   Calcium 8.9 - 10.3 mg/dL 8.5(L) 8.7(L) 8.4(L)  Total Protein 6.5 - 8.1 g/dL - - -  Total Bilirubin 0.3 - 1.2 mg/dL - - -  Alkaline Phos 38 - 126 U/L - - -  AST 15 - 41 U/L - - -  ALT 0 - 44 U/L - - -      Microbiology: Recent Results (from the past 240 hour(s))  Blood Culture (routine x 2)     Status: None (Preliminary result)   Collection Time: 07/12/20  9:33 PM   Specimen: BLOOD  Result Value Ref Range Status   Specimen Description BLOOD BLOOD RIGHT HAND  Final   Special Requests   Final    BOTTLES DRAWN AEROBIC AND ANAEROBIC Blood Culture adequate volume   Culture   Final    NO GROWTH 3 DAYS Performed at [facility], [address], [place], [place] [address]    Report Status PENDING  Incomplete  Blood Culture (routine x 2)     Status: None (Preliminary result)   Collection Time: 07/12/20  9:38 PM   Specimen: BLOOD  Result Value Ref Range Status   Specimen Description BLOOD RIGHT ANTECUBITAL  Final   Special Requests   Final    BOTTLES DRAWN AEROBIC AND ANAEROBIC Blood Culture results may not be optimal due to an inadequate volume of blood received in culture bottles   Culture   Final    NO GROWTH 3 DAYS Performed at [facility], [address], [place], [place] [address]    Report Status PENDING  Incomplete  Resp Panel by RT-PCR (Flu A&B, Covid) Nasopharyngeal Swab     Status: None   Collection Time: 07/12/20  9:40 PM   Specimen: Nasopharyngeal Swab;  Nasopharyngeal(NP) swabs in vial transport medium  Result Value Ref Range Status   SARS Coronavirus 2 by RT PCR NEGATIVE NEGATIVE Final    Comment: (NOTE) SARS-CoV-2 target nucleic acids are NOT DETECTED.  The SARS-CoV-2 RNA is generally detectable in upper respiratory specimens during the acute phase of infection. The lowest concentration of SARS-CoV-2 viral copies this assay can detect is 138 copies/mL. A negative  result does not preclude SARS-Cov-2 infection and should not be used as the sole basis for treatment or other patient management decisions. A negative result may occur with  improper specimen collection/handling, submission of specimen other than nasopharyngeal swab, presence of viral mutation(s) within the areas targeted by this assay, and inadequate number of viral copies(<138 copies/mL). A negative result must be combined with clinical observations, patient history, and epidemiological information. The expected result is Negative.  Fact Sheet for Patients:  EntrepreneurPulse.com.au  Fact Sheet for Healthcare Providers:  IncredibleEmployment.be  This test is no t yet approved or cleared by the [place] FDA and  has been authorized for detection and/or diagnosis of SARS-CoV-2 by FDA under an Emergency Use Authorization (EUA). This EUA will remain  in effect (meaning this test can be used) for the duration of the COVID-19 declaration under Section 564(b)(1) of the Act, 21 U.S.C.section 360bbb-3(b)(1), unless the authorization is terminated  or revoked sooner.       Influenza A by PCR NEGATIVE NEGATIVE Final   Influenza B by PCR NEGATIVE NEGATIVE Final    Comment: (NOTE) The Xpert Xpress SARS-CoV-2/FLU/RSV plus assay is intended as an aid in the diagnosis of influenza from Nasopharyngeal swab specimens and should not be used as a sole basis for treatment. Nasal washings and aspirates are unacceptable for Xpert Xpress  SARS-CoV-2/FLU/RSV testing.  Fact Sheet for Patients: EntrepreneurPulse.com.au  Fact Sheet for Healthcare Providers: IncredibleEmployment.be  This test is not yet approved or cleared by the [place] FDA and has been authorized for detection and/or diagnosis of SARS-CoV-2 by FDA under an Emergency Use Authorization (EUA). This EUA will remain in effect (meaning this test can be used) for the duration of the COVID-19 declaration under Section 564(b)(1) of the Act, 21 U.S.C. section 360bbb-3(b)(1), unless the authorization is terminated or revoked.  Performed at [facility], [address] [place], [place] [address]   Urine culture     Status: Abnormal   Collection Time: 07/12/20 10:18 PM   Specimen: In/Out Cath Urine  Result Value Ref Range Status   Specimen Description   Final    IN/OUT CATH URINE Performed at [facility], [address] [place], [place] [address]    Special Requests   Final    NONE Performed at [facility], [address] [place], [place] [address]    Culture >=100,000 COLONIES/mL SERRATIA LIQUEFACIENS (A)  Final   Report Status 07/15/2020 FINAL  Final   Organism ID, Bacteria SERRATIA LIQUEFACIENS (A)  Final      Susceptibility   Serratia liquefaciens - MIC*    CEFAZOLIN >=64 RESISTANT Resistant     CEFEPIME <=0.12 SENSITIVE Sensitive     CEFTRIAXONE <=0.25 SENSITIVE Sensitive     CIPROFLOXACIN <=0.25 SENSITIVE Sensitive     GENTAMICIN <=1 SENSITIVE Sensitive     IMIPENEM 1 SENSITIVE Sensitive     NITROFURANTOIN 128 RESISTANT Resistant     TRIMETH/SULFA <=20 SENSITIVE Sensitive     PIP/TAZO <=4 SENSITIVE Sensitive     * >=100,000 COLONIES/mL SERRATIA LIQUEFACIENS    IMAGING RESULTS:  I have personally reviewed the films ? Impression/Recommendation ?80 yr female admitted with weakness , chocking on food  Rt lowe rlobe pneumonia On appropriate antibiotic- currenty on  zosyn   Serratia in the urine culture is colonizing  She has a vaginal pessary and serratia is likely colonizing it No wbc in urine indicative of no infection, so no need to treat . Pessary was removed  today. If she is going to SNF tomorrow then will need augmenitn for 3 more days  HTN      ___ Discussed with patient and care team  ing Dragon voice recognition software and may include unintentional dictation errors.

## 2020-07-15 NOTE — Consult Note (Signed)
Consulted by attending physician, Elmarie Shiley, MD, due to Serratia infection.  According to Infectious Disease there is concern that there is no UTI. Instead the concern is that her pessary is colonized.  ID recommended the pessary be removed and replaced (at a later date due to lack of availability in hospital).  I discussed the reasoning for removing the pessary with the patient. She agreed to have the pessary removed.    With a female chaperone present the patient was in the supine position with frog-leg position.  A single, gloved hand placed in vagina. Pessary grasped and gently removed completely.  The patient tolerated the procedure well. There was no noted blood on the pessary or on the glove.    Discussed with the patient, her attending doctor, and her nurse that she may have some prolapse of vaginal tissue and how to treat this. She may also either have incontinence (most likely) or difficulty voiding.    Will order another pessary. Patient instructed to follow up after discharge to have new pessary placed.   Prentice Docker, MD, [facility], [facility] 07/15/2020 3:12 PM

## 2020-07-15 NOTE — NC FL2 (Signed)
[place] LEVEL OF CARE SCREENING TOOL     IDENTIFICATION  Patient Name: Christy [name] Birthdate: [birth year] Sex: female Admission Date (Current Location): 07/12/2020  [place] and [place] Number:  Engineering geologist and Address:  [facility], [address], [place], [place] [address]      Provider Number: [id number]  Attending Physician Name and Address:  Elmarie Shiley, MD  Relative Name and Phone Number:  Garfield Cornea (daughter) [phone]    Current Level of Care: Hospital Recommended Level of Care: [place] Prior Approval Number:    Date Approved/Denied:   PASRR Number: [id number]  Discharge Plan: SNF    Current Diagnoses: Patient Active Problem List   Diagnosis Date Noted  . Ataxia 07/24/2019  . Confusion 07/24/2019  . Sepsis ([place]) 05/22/2019  . Sepsis secondary to UTI ([place]) 05/22/2019  . Hypotension 05/22/2019  . Acute kidney injury superimposed on CKD ([place]) 05/22/2019  . Compression fracture of body of thoracic vertebra ([place]) 05/04/2019  . Compression fracture of C-spine ([place]) 05/04/2019  . Fall 05/03/2019  . Anxiety, generalized 03/19/2019  . Exertional shortness of breath 10/03/2018  . Heart palpitations 10/02/2018  . Mild cognitive impairment 03/28/2018  . Aortic atherosclerosis ([place]) 02/24/2018  . 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 Cystocele, midline 04/28/2017  . Recurrent major depressive disorder, in partial remission ([place]) 03/17/2017  . Neuropathy 03/01/2017  . Lumbosacral radiculopathy 02/16/2017  . Bilateral pain of leg and foot 12/16/2016  . Fatigue 12/16/2016  . Trochanteric bursitis of both hips 12/16/2016  . Weakness of both legs 12/16/2016  . Stress incontinence of urine 10/15/2014  . Polypharmacy 09/02/2014  . Primary cancer of right upper lobe of lung ([place]) 07/19/2014  . Acid reflux 07/18/2014  . 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 Osteoporosis, post-menopausal  07/18/2014  . Chronic kidney disease (CKD), stage III (moderate) ([place]) 08/30/2013  . Benign essential HTN 08/30/2013  . Fatty liver disease, nonalcoholic 16/08/3708  . Fibrositis 08/30/2013  . Hot flash, menopausal 08/30/2013  . Billowing mitral valve 08/30/2013  . Allergic rhinitis, seasonal 08/30/2013  . Fibromyalgia 08/30/2013    Orientation RESPIRATION BLADDER Height & Weight     Self,Time,Situation,Place  Normal External catheter Weight: 68 kg Height:  5\' 6"  (167.6 cm)  BEHAVIORAL SYMPTOMS/MOOD NEUROLOGICAL BOWEL NUTRITION STATUS      Continent Diet (Dysphagia 3, thin liquids- meds with puree, no straws)  AMBULATORY STATUS COMMUNICATION OF NEEDS Skin   Limited Assist Verbally Normal                       Personal Care Assistance Level of Assistance  Bathing,Feeding,Dressing Bathing Assistance: Limited assistance Feeding assistance: Limited assistance Dressing Assistance: Limited assistance     Functional Limitations Info             SPECIAL CARE FACTORS FREQUENCY  PT (By licensed PT),OT (By licensed OT),Speech therapy     PT Frequency: 5 times per week OT Frequency: 5 times per week     Speech Therapy Frequency: 2-3 times per week      Contractures Contractures Info: Not present    Additional Factors Info  Code Status,Allergies Code Status Info: Full` Allergies Info: Alendronate, nitrofurantojunn, sulfa, cefdinir, cipro, diphenhydramine, lactose           Current Medications (07/15/2020):  This is the current hospital active medication list Current Facility-Administered Medications  Medication Dose Route Frequency Provider Last Rate Last Admin  . acetaminophen (TYLENOL) tablet  650 mg  650 mg Oral Q6H PRN Mansy, Jan A, MD   650 mg at 07/14/20 0901   Or  . acetaminophen (TYLENOL) suppository 650 mg  650 mg Rectal Q6H PRN Mansy, Jan A, MD      . azelastine (ASTELIN) 0.1 % nasal spray 2 spray  2 spray Each Nare BID PRN Mansy, Jan A, MD      .  benazepril (LOTENSIN) tablet 5 mg  5 mg Oral Daily Mansy, Jan A, MD   5 mg at 07/14/20 0905  . cholecalciferol (VITAMIN D3) tablet 1,000 Units  1,000 Units Oral Daily Mansy, Jan A, MD   1,000 Units at 07/14/20 0901  . diclofenac Sodium (VOLTAREN) 1 % topical gel 2 g  2 g Topical QID Regalado, Belkys A, MD   2 g at 07/14/20 2133  . donepezil (ARICEPT) tablet 5 mg  5 mg Oral QHS Mansy, Jan A, MD   5 mg at 07/14/20 2133  . DULoxetine (CYMBALTA) DR capsule 20 mg  20 mg Oral Daily Regalado, Belkys A, MD   20 mg at 07/14/20 1749  . enoxaparin (LOVENOX) injection 40 mg  40 mg Subcutaneous Q24H Mansy, Jan A, MD   40 mg at 07/14/20 0902  . estradiol (ESTRACE) tablet 1 mg  1 mg Oral Daily Mansy, Jan A, MD   1 mg at 07/14/20 0905  . feeding supplement (ENSURE ENLIVE / ENSURE PLUS) liquid 237 mL  237 mL Oral BID BM Regalado, Belkys A, MD   237 mL at 07/14/20 1410  . guaiFENesin (MUCINEX) 12 hr tablet 600 mg  600 mg Oral BID Mansy, Jan A, MD   600 mg at 07/14/20 2133  . guaiFENesin-dextromethorphan (ROBITUSSIN DM) 100-10 MG/5ML syrup 5 mL  5 mL Oral Q4H PRN Regalado, Belkys A, MD      . ipratropium-albuterol (DUONEB) 0.5-2.5 (3) MG/3ML nebulizer solution 3 mL  3 mL Nebulization TID Regalado, Belkys A, MD   3 mL at 07/15/20 0807  . magnesium hydroxide (MILK OF MAGNESIA) suspension 30 mL  30 mL Oral Daily PRN Mansy, Arvella Merles, MD      . multivitamin-lutein [facility]) capsule   Oral Daily Mansy, Arvella Merles, MD   1 capsule at 07/14/20 0905  . nystatin (MYCOSTATIN) 100000 UNIT/ML suspension 500,000 Units  5 mL Oral QID Regalado, Belkys A, MD   500,000 Units at 07/14/20 2133  . ondansetron (ZOFRAN) tablet 4 mg  4 mg Oral Q6H PRN Mansy, Jan A, MD       Or  . ondansetron [facility]) injection 4 mg  4 mg Intravenous Q6H PRN Mansy, Jan A, MD      . piperacillin-tazobactam (ZOSYN) IVPB 3.375 g  3.375 g Intravenous Q8H Renda Rolls, RPH 12.5 mL/hr at 07/14/20 2332 3.375 g at 07/14/20 2332  . traZODone (DESYREL) tablet 25 mg   25 mg Oral QHS PRN Mansy, Jan A, MD   25 mg at 07/14/20 2331  . vitamin B-12 (CYANOCOBALAMIN) tablet 1,000 mcg  1,000 mcg Oral Daily Mansy, Jan A, MD   1,000 mcg at 07/14/20 0902     Discharge Medications: Please see discharge summary for a list of discharge medications.  Relevant Imaging Results:  Relevant Lab Results:   Additional Information SS# [id number]  Shelbie Hutching, RN

## 2020-07-15 NOTE — Care Management Important Message (Signed)
Important Message  Patient Details  Name: Christy [name] MRN: [id number] Date of Birth: [birth year]   Medicare Important Message Given:  N/A - LOS <3 / Initial given by admissions     Juliann Pulse A Samella Lucchetti 07/15/2020, 8:08 AM

## 2020-07-15 NOTE — TOC Progression Note (Signed)
Transition of Care [facility]) - Progression Note    Patient Details  Name: Christy [name] MRN: [id number] Date of Birth: 04/22/1940  Transition of Care [facility]) CM/SW Contact  Shelbie Hutching, RN Phone Number: 07/15/2020, 11:21 AM  Clinical Narrative:    [facility] in [place] has offered a bed, bed offer accepted.  MD reports patient will likely be medically ready for discharge tomorrow.  [facility] insurance authorization started in the [employer] authorization approved, auth ID [id number], reference [id number], approved 6/7-6/9.  Patient's daughter updated on plan of care.    Expected Discharge Plan: [place] Barriers to Discharge: Continued Medical Work up  Expected Discharge Plan and Services Expected Discharge Plan: [place]   Discharge Planning Services: CM Consult Post Acute Care Choice: [place] Living arrangements for the past 2 months: [place]                 DME Arranged: N/A DME Agency: NA       HH Arranged: NA           Social Determinants of Health (SDOH) Interventions    Readmission Risk Interventions No flowsheet data found.

## 2020-07-15 NOTE — Progress Notes (Addendum)
Speech Language Pathology Treatment: Dysphagia  Patient Details Name: Christy [name] MRN: [id number] DOB: August 17, 1940 Today's Date: 07/15/2020 Time: 7322-0254 SLP Time Calculation (min) (ACUTE ONLY): 35 min  Assessment / Plan / Recommendation Clinical Impression  Pt seen for ongoing assessment of swallowing and toleration of diet. Pt was resting but awakened to have trials w/ this SLP. Pt was verbally responsive, followed instructions w/ min cues -- continues to be easily distracted at times and often talkative. Unsure of pt's Baseline Cognitive status; she is on Aricept per chart. Pt is on RA; afebrile.  Explained and reviewed general aspiration precautions and the need for following them especially sitting upright for all oral intake, No talking during/just after drinking liquids, No straws, Small sips Slowly. Pt assisted w/ positioning d/t weakness then given trials of thin liquids via Cup. No immediate, overt clinical s/s of aspiration were noted w/ any consistency; respiratory status remained calm and unlabored, vocal quality clear b/t trials. Delayed throat clearing noted x1 as pt began talking after swallowing. Discussed Talking and Distractions interfering w/ focus and attention on swallowing/clearing mouth fully w/ pt and the risk for aspiration that can occur. Discussed using a puree for Pill swallowing as the puree provides cohesion for swallowing tablets. Oral phase appeared grossly [facility] for bolus management and timely A-P transfer for swallowing; oral clearing achieved w/ all consistencies.  Recommend continue a Dysphagia level 3 diet (mech soft) w/ gravies added to moisten foods; Thin liquids Via Cup. Recommend aspiration precautions as aboe; Pills Whole in Puree; tray setup and positioning assistance for meals. ST services will continue to f/u w/ pt for toleration of diet and education as needed while admitted. NSG updated. Precautions posted at bedside.      HPI HPI: Pt is a 80  y.o. 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  She also admits to dysuria and urinary frequency without hematuria or flank pain.  She has been having mild neck pain and stiffness without headache or dizziness or blurred vision..  She reportedly had a fever of 102 per EMS and heart rate of 120 and was having suprapubic abdominal pressure.  Urine cultures drawn w/ "urine culture Growing Serratia Liquefaciens"  CXR: "Questionable small opacity at the RIGHT lung base. This could  represent pneumonia, aspiration or atelectasis".    Of note, pt is on Aricept -- unsure of Baseline Cognitive status.      SLP Plan  Continue with current plan of care       Recommendations  Diet recommendations: Dysphagia 3 (mechanical soft);Thin liquid Liquids provided via: Cup;No straw Medication Administration: Whole meds with puree (for safer swallowing) Supervision: Patient able to self feed;Intermittent supervision to cue for compensatory strategies (cues) Compensations: Minimize environmental distractions;Slow rate;Small sips/bites;Lingual sweep for clearance of pocketing;Follow solids with liquid (NO STRAWS) Postural Changes and/or Swallow Maneuvers: Out of bed for meals;Seated upright 90 degrees;Upright 30-60 min after meal                General recommendations:  (Dietician f/u) Oral Care Recommendations: Oral care BID;Oral care before and after PO;Staff/trained caregiver to provide oral care Follow up Recommendations: None (TBD) SLP Visit Diagnosis: Dysphagia, pharyngeal phase (R13.13) (thin liquids;  baseline Confusion - on  Aricept) Plan: Continue with current plan of care       GO                 [name], [place], CCC-SLP Speech Language Pathologist Rehab Services [phone] [facility] 07/15/2020, 2:04 PM

## 2020-07-15 NOTE — Progress Notes (Addendum)
PROGRESS NOTE    Christy [name]  IRC:[id number] DOB: [birth year] DOA: 07/12/2020 PCP: Ezequiel Kayser, MD   Brief Narrative: 80 year old with past medical history significant for CAD, hypertension, lung cancer status post right upper lobectomy, depression anxiety who presents to the ED complaining of acute onset of generalized weakness associated with cough, occasional wheezing and associated dyspnea.  Patient also reports dysuria and urinary frequency.  She also complained of neck pain.  Evaluation in the ED; patient was found to be tachycardic tachypnea respiration rate 25, leukocytosis 16, COVID PCR negative.  Chest x-ray questionable small opacity right lung base that may represent pneumonia, aspiration or atelectasis.  Patient was admitted with Sepsis secondary to strep Pneumonia, she was started initially on Zosyn, transition to Ceftriaxone 6/07 per ID recommendations. If patient remain stable could be discharge to [facility] 6/08.   Assessment & Plan:   Active Problems:   Sepsis ([place])  1-Sepsis secondary to PNA and UTI: Present On admission. -Patient presents with tachycardia, tachypnea respiration rate 25, leukocytosis.  Chest x-ray with opacity right lung base. -Treated initially with Zosyn, change to ceftriaxone 6/07.  -Blood culture: No growth to date. , urine culture Growing Serratia Liquefaciens. .  -Sputum Culture ordered.  -Strep Pneumonia  Positive and legionella antigen pending.   -WBC trending down.  -Speech evaluation, due to chocking episode. Recommend Regular diet with well cut meats, moistened foods, thin liquid.   -Discussed with ID, plan to change antibiotics to ceftriaxone to treat PNA.   2-Hypertension: Continue with benazepril  3-Depression, dementia: Continue with Aricept, Cymbalta. Discontinue Remeron, per family patient gets very sleepy with Remeron.    4-B12 deficiency: Continue with B12 supplement  5-Oral candidiasis;  Started Nystatin.  Will give  one time diflucan dose.   6-Hypokalemia; replete orally.  7-Neck pain; daughter report patient has been complaining of worsening neck pain since recent fall at ALF last week. Cervical Spine X ray; negative for fracture.  Pain improved.   8-UTI, serratia in urine.  Discussed with ID less likely patient has UTI due to no WBC in urine. We should consider colonization of Vaginal pessary by serratia. I have send message to patient GYN through secure chat. Awaiting response.  Addendum:  -Discussed with Dr Glennon Mac (GYN), he will see patient in consultation and remove pessary. Patient will need to follow up with Dr Aline Brochure to have pessary place.    Left ankle pain; plan to apply Voltaren gel. Schedule oral tylenol.   Estimated body mass index is 24.2 kg/m as calculated from the following:   Height as of this encounter: 5\' 6"  (1.676 m).   Weight as of this encounter: 68 kg.   DVT prophylaxis: Lovenox Code Status: Partial Code, no intubation. Family Communication: Daughter updated 6/06. 6/07  Disposition Plan:  Status is: Inpatient  Remains inpatient appropriate because:IV treatments appropriate due to intensity of illness or inability to take PO   Dispo: The patient is from: SNF              Anticipated d/c is to: SNF              Patient currently is not medically stable to d/c.   Difficult to place patient No        Consultants:   None  Procedures:   None  Antimicrobials:  Zosyn 6/05 Azithro 6/04  Subjective: She report neck pain is better.  She is complaining of left ankle pain. She denies dyspnea. Cough improving.    Objective:  Vitals:   07/15/20 0438 07/15/20 0801 07/15/20 0807 07/15/20 1136  BP: 133/62 (!) 144/77  (!) 124/57  Pulse: 77 81  77  Resp: 14 17    Temp: 98.1 F (36.7 C) 98.1 F (36.7 C)  97.6 F (36.4 C)  TempSrc: Oral     SpO2: 94% 97% 97% 97%  Weight:      Height:        Intake/Output Summary (Last 24 hours) at 07/15/2020 1358 Last  data filed at 07/15/2020 1000 Gross per 24 hour  Intake 0 ml  Output 600 ml  Net -600 ml   Filed Weights   07/12/20 2112 07/13/20 0135  Weight: 64.9 kg 68 kg    Examination:  General exam: NAD Respiratory system: CTA Cardiovascular system: S1, S 2 RRR Gastrointestinal system: BS present, soft, nt Central nervous system: Alert, follows command Extremities: Symmetric power    Data Reviewed: I have personally reviewed following labs and imaging studies  CBC: Recent Labs  Lab 07/12/20 2133 07/13/20 0600 07/14/20 0640 07/15/20 0748  WBC 16.6* 19.2* 13.2* 11.3*  NEUTROABS 14.7*  --   --   --   HGB 13.9 12.5 11.6* 11.3*  HCT 43.1 38.2 35.6* 34.6*  MCV 92.7 93.4 94.4 93.3  PLT 230 191 173 102   Basic Metabolic Panel: Recent Labs  Lab 07/12/20 2133 07/13/20 0600 07/14/20 0640 07/15/20 0748  NA 140 140 140 138  K 3.6 3.4* 4.5 4.1  CL 104 107 108 104  CO2 26 25 26 22   GLUCOSE 126* 120* 102* 94  BUN 15 13 14 16   CREATININE 0.83 0.87 1.00 0.91  CALCIUM 9.3 8.4* 8.7* 8.5*   GFR: Estimated Creatinine Clearance: 45.4 mL/min (by C-G formula based on SCr of 0.91 mg/dL). Liver Function Tests: Recent Labs  Lab 07/12/20 2133  AST 23  ALT 14  ALKPHOS 77  BILITOT 1.1  PROT 7.3  ALBUMIN 3.7   No results for input(s): LIPASE, AMYLASE in the last 168 hours. No results for input(s): AMMONIA in the last 168 hours. Coagulation Profile: Recent Labs  Lab 07/13/20 0556  INR 1.1   Cardiac Enzymes: No results for input(s): CKTOTAL, CKMB, CKMBINDEX, TROPONINI in the last 168 hours. BNP (last 3 results) No results for input(s): PROBNP in the last 8760 hours. HbA1C: No results for input(s): HGBA1C in the last 72 hours. CBG: No results for input(s): GLUCAP in the last 168 hours. Lipid Profile: No results for input(s): CHOL, HDL, LDLCALC, TRIG, CHOLHDL, LDLDIRECT in the last 72 hours. Thyroid Function Tests: No results for input(s): TSH, T4TOTAL, FREET4, T3FREE, THYROIDAB  in the last 72 hours. Anemia Panel: No results for input(s): VITAMINB12, FOLATE, FERRITIN, TIBC, IRON, RETICCTPCT in the last 72 hours. Sepsis Labs: Recent Labs  Lab 07/12/20 2119 07/13/20 0600  PROCALCITON  --  0.11  LATICACIDVEN 1.1  --     Recent Results (from the past 240 hour(s))  Blood Culture (routine x 2)     Status: None (Preliminary result)   Collection Time: 07/12/20  9:33 PM   Specimen: BLOOD  Result Value Ref Range Status   Specimen Description BLOOD BLOOD RIGHT HAND  Final   Special Requests   Final    BOTTLES DRAWN AEROBIC AND ANAEROBIC Blood Culture adequate volume   Culture   Final    NO GROWTH 3 DAYS Performed at [facility], [address], [place], [place] [address]    Report Status PENDING  Incomplete  Blood Culture (routine  x 2)     Status: None (Preliminary result)   Collection Time: 07/12/20  9:38 PM   Specimen: BLOOD  Result Value Ref Range Status   Specimen Description BLOOD RIGHT ANTECUBITAL  Final   Special Requests   Final    BOTTLES DRAWN AEROBIC AND ANAEROBIC Blood Culture results may not be optimal due to an inadequate volume of blood received in culture bottles   Culture   Final    NO GROWTH 3 DAYS Performed at [facility], [address] [place], [place] [address]    Report Status PENDING  Incomplete  Resp Panel by RT-PCR (Flu A&B, Covid) Nasopharyngeal Swab     Status: None   Collection Time: 07/12/20  9:40 PM   Specimen: Nasopharyngeal Swab; Nasopharyngeal(NP) swabs in vial transport medium  Result Value Ref Range Status   SARS Coronavirus 2 by RT PCR NEGATIVE NEGATIVE Final    Comment: (NOTE) SARS-CoV-2 target nucleic acids are NOT DETECTED.  The SARS-CoV-2 RNA is generally detectable in upper respiratory specimens during the acute phase of infection. The lowest concentration of SARS-CoV-2 viral copies this assay can detect is 138 copies/mL. A negative result does not preclude SARS-Cov-2 infection and should  not be used as the sole basis for treatment or other patient management decisions. A negative result may occur with  improper specimen collection/handling, submission of specimen other than nasopharyngeal swab, presence of viral mutation(s) within the areas targeted by this assay, and inadequate number of viral copies(<138 copies/mL). A negative result must be combined with clinical observations, patient history, and epidemiological information. The expected result is Negative.  Fact Sheet for Patients:  EntrepreneurPulse.com.au  Fact Sheet for Healthcare Providers:  IncredibleEmployment.be  This test is no t yet approved or cleared by the [place] FDA and  has been authorized for detection and/or diagnosis of SARS-CoV-2 by FDA under an Emergency Use Authorization (EUA). This EUA will remain  in effect (meaning this test can be used) for the duration of the COVID-19 declaration under Section 564(b)(1) of the Act, 21 U.S.C.section 360bbb-3(b)(1), unless the authorization is terminated  or revoked sooner.       Influenza A by PCR NEGATIVE NEGATIVE Final   Influenza B by PCR NEGATIVE NEGATIVE Final    Comment: (NOTE) The Xpert Xpress SARS-CoV-2/FLU/RSV plus assay is intended as an aid in the diagnosis of influenza from Nasopharyngeal swab specimens and should not be used as a sole basis for treatment. Nasal washings and aspirates are unacceptable for Xpert Xpress SARS-CoV-2/FLU/RSV testing.  Fact Sheet for Patients: EntrepreneurPulse.com.au  Fact Sheet for Healthcare Providers: IncredibleEmployment.be  This test is not yet approved or cleared by the [place] FDA and has been authorized for detection and/or diagnosis of SARS-CoV-2 by FDA under an Emergency Use Authorization (EUA). This EUA will remain in effect (meaning this test can be used) for the duration of the COVID-19 declaration under  Section 564(b)(1) of the Act, 21 U.S.C. section 360bbb-3(b)(1), unless the authorization is terminated or revoked.  Performed at [facility], [address] [place], [place] [address]   Urine culture     Status: Abnormal   Collection Time: 07/12/20 10:18 PM   Specimen: In/Out Cath Urine  Result Value Ref Range Status   Specimen Description   Final    IN/OUT CATH URINE Performed at [facility], [address] [place], [place] [address]    Special Requests   Final    NONE Performed at [facility], [phone] [place], [place] [address]    Culture >=100,000 COLONIES/mL SERRATIA LIQUEFACIENS (A)  Final   Report Status 07/15/2020 FINAL  Final   Organism ID, Bacteria SERRATIA LIQUEFACIENS (A)  Final      Susceptibility   Serratia liquefaciens - MIC*    CEFAZOLIN >=64 RESISTANT Resistant     CEFEPIME <=0.12 SENSITIVE Sensitive     CEFTRIAXONE <=0.25 SENSITIVE Sensitive     CIPROFLOXACIN <=0.25 SENSITIVE Sensitive     GENTAMICIN <=1 SENSITIVE Sensitive     IMIPENEM 1 SENSITIVE Sensitive     NITROFURANTOIN 128 RESISTANT Resistant     TRIMETH/SULFA <=20 SENSITIVE Sensitive     PIP/TAZO <=4 SENSITIVE Sensitive     * >=100,000 COLONIES/mL SERRATIA LIQUEFACIENS         Radiology Studies: DG Cervical Spine 2 or 3 views  Result Date: 07/13/2020 CLINICAL DATA:  Neck pain since recent fall EXAM: CERVICAL SPINE - 2-3 VIEW COMPARISON:  Cervical spine CT May 03, 2019. FINDINGS: Six cervical type vertebral bodies are well visualized on the lateral view. There is no evidence of cervical spine fracture or prevertebral soft tissue swelling. Similar extension weighted cervical lordosis. No evidence of listhesis. The dens and lateral masses appear intact. No other significant bone abnormalities are identified. No thoracic vertebral augmentation. IMPRESSION: No acute findings in the cervical spine. Please note CT is more sensitive for cervical spine fracture.  Electronically Signed   By: Dahlia Bailiff MD   On: 07/13/2020 15:48        Scheduled Meds: . benazepril  5 mg Oral Daily  . cholecalciferol  1,000 Units Oral Daily  . diclofenac Sodium  2 g Topical QID  . donepezil  5 mg Oral QHS  . DULoxetine  20 mg Oral Daily  . enoxaparin (LOVENOX) injection  40 mg Subcutaneous Q24H  . estradiol  1 mg Oral Daily  . feeding supplement  237 mL Oral BID BM  . guaiFENesin  600 mg Oral BID  . ipratropium-albuterol  3 mL Nebulization TID  . multivitamin-lutein   Oral Daily  . nystatin  5 mL Oral QID  . vitamin B-12  1,000 mcg Oral Daily   Continuous Infusions: . cefTRIAXone (ROCEPHIN)  IV       LOS: 3 days    Time spent: 35 minutes.     Elmarie Shiley, MD [facility]   If 7PM-7AM, please contact night-coverage [email or web address]  07/15/2020, 1:58 PM

## 2020-07-16 ENCOUNTER — Telehealth: Payer: Self-pay

## 2020-07-16 DIAGNOSIS — F0390 Unspecified dementia without behavioral disturbance: Secondary | ICD-10-CM

## 2020-07-16 DIAGNOSIS — F32A Depression, unspecified: Secondary | ICD-10-CM

## 2020-07-16 DIAGNOSIS — A408 Other streptococcal sepsis: Secondary | ICD-10-CM

## 2020-07-16 DIAGNOSIS — M542 Cervicalgia: Secondary | ICD-10-CM

## 2020-07-16 DIAGNOSIS — N8111 Cystocele, midline: Secondary | ICD-10-CM

## 2020-07-16 DIAGNOSIS — F419 Anxiety disorder, unspecified: Secondary | ICD-10-CM

## 2020-07-16 MED ORDER — GUAIFENESIN ER 600 MG PO TB12: 600.0000 mg | ORAL_TABLET | Freq: Two times a day (BID) | ORAL | 0 refills | Status: AC

## 2020-07-16 MED ORDER — AMOXICILLIN-POT CLAVULANATE 875-125 MG PO TABS: 1.0000 | ORAL_TABLET | Freq: Two times a day (BID) | ORAL | 0 refills | Status: AC

## 2020-07-16 MED ORDER — NYSTATIN 100000 UNIT/ML MT SUSP: 5.0000 mL | Freq: Four times a day (QID) | OROMUCOSAL | 0 refills | Status: DC

## 2020-07-16 MED ORDER — IPRATROPIUM-ALBUTEROL 0.5-2.5 (3) MG/3ML IN SOLN: 3.0000 mL | Freq: Two times a day (BID) | RESPIRATORY_TRACT | Status: DC

## 2020-07-16 NOTE — TOC Transition Note (Signed)
Transition of Care [facility]) - CM/SW Discharge Note   Patient Details  Name: Christy [name] MRN: [id number] Date of Birth: 12-09-1940  Transition of Care [facility]) CM/SW Contact:  Shelbie Hutching, RN Phone Number: 07/16/2020, 1:27 PM   Clinical Narrative:    Patient medically cleared for discharge to [facility] in [place].  Patient's daughter [name] notified of discharge today.  Patient is going to room E9, bedside RN has already called report.  EMS transport has been arranged and should be here soon.     Final next level of care: Skilled Nursing Facility Barriers to Discharge: Barriers Resolved   Patient Goals and CMS Choice Patient states their goals for this hospitalization and ongoing recovery are:: DC to [facility] CMS Medicare.gov Compare Post Acute Care list provided to:: Patient Choice offered to / list presented to : [place]  Discharge Placement   Existing PASRR number confirmed : 07/15/20          Patient chooses bed at: [facility] Patient to be transferred to facility by: [facility] Name of family member notified: Garfield Cornea (daughter) Patient and family notified of of transfer: 07/16/20  Discharge Plan and Services   Discharge Planning Services: CM Consult Post Acute Care Choice: [place]          DME Arranged: N/A DME Agency: NA       HH Arranged: NA          Social Determinants of Health (SDOH) Interventions     Readmission Risk Interventions No flowsheet data found.

## 2020-07-16 NOTE — Care Management Important Message (Signed)
Important Message  Patient Details  Name: Christy [name] MRN: [id number] Date of Birth: 1940-07-05   Medicare Important Message Given:  Yes  Patient was sleeping and I left a copy of the Important Message from Medicare on her bedside table to review when she wakes up.   Juliann Pulse A Laddie Naeem 07/16/2020, 11:41 AM

## 2020-07-16 NOTE — Discharge Summary (Signed)
Physician Discharge Summary  Christy [name] HYW:[id number] DOB: 1940/08/08 DOA: 07/12/2020  PCP: Ezequiel Kayser, MD  Admit date: 07/12/2020 Discharge date: 07/16/2020  Admitted From: ALF Disposition: SNF  Recommendations for Outpatient Follow-up:  1. Follow ups as below. 2. Please obtain CBC/BMP/Mag at follow up 3. Please follow up on the following pending results: None  Discharge Condition: Stable CODE STATUS: Full code except intubation   Contact information for follow-up providers    [facility]. Schedule an appointment as soon as possible for a visit.   Specialty: Obstetrics and Gynecology Why: After discharge from hospital with Dr. Kenton Kingfisher for pessary placement. Contact information: [address] [place] [address] [phone]           Contact information for after-discharge care    Destination    [facility] .   Service: Skilled Nursing Contact information: [address] [place]                  Hospital Course: 80 year old F with PMH of dementia, CAD, lung cancer s/p RUL lobectomy, HTN, anxiety and depression presenting with acute onset generalized weakness, cough, with an shortness of breath and admitted for sepsis due to community-acquired pneumonia.  She had tachycardia pia leukocytosis.  COVID-19 PCR nonreactive.  CXR with small right lung base opacity.  Strep pneumo antigen positive.  Urine culture with Serratia felt to be contaminant from her pessary vs UTI. Patient was initially started on IV Zosyn.  Infectious disease consulted.  Antibiotic de-escalated to IV ceftriaxone on 6/7.  Patient removed by Gyn per ID recommendation.  ID recommended p.o. Augmentin on discharge for 3 more days.  Therapy recommended SNF.  Patient to follow-up with her Gyn for pessary replacement.   Discharge Diagnoses:  Sepsis secondary to PNA and possible UTI: POA.   Sepsis physiology resolved.  Strep pneumo antigen positive.  Urine culture with Serratia liquefaciens.  Pessary removed by OB/GYN. -Zosyn>> CTX.  Discharged on p.o. Augmentin for 3 more days per ID recommendation. -Mucolytic's as needed  Essential hypertension: Normotensive. -Continue with benazepril  Dementia without behavioral disturbance/depression: Stable. -Remeron discontinued per family request due to excessive sleepiness -Continue Aricept. -Cymbalta discontinued-patient was not on Cymbalta before this hospitalization. -Reorientation and delirium precautions.  Oral candidiasis -Received Diflucan x1. -Continue nystatin suspension  Possible UTI: Urine culture with Serratia. -Antibiotics as above  Cystocele: had pessary which was removed out of concern for colonization with Serratia. -Outpatient follow-up with OB/GYN for replacement   Fall at ALF: Had neck pain since fall per patient's daughter.  X-ray did not reveal acute osseous abnormalities.  Pain improved. -Fall precautions -Therapy -As needed Tylenol.  Left ankle pain: Improved. -As needed Tylenol.   Body mass index is 24.2 kg/m.            Discharge Exam: Vitals:   07/16/20 0836 07/16/20 1140  BP: (!) 146/55 (!) 120/54  Pulse: 66 69  Resp: 14 16  Temp: (!) 97.4 F (36.3 C) 97.6 F (36.4 C)  SpO2: 96% 95%    GENERAL: No apparent distress.  Nontoxic. HEENT: MMM.  Vision and hearing grossly intact.  NECK: Supple.  No apparent JVD.  RESP: On RA.  No IWOB.  Fair aeration bilaterally. CVS:  RRR. Heart sounds normal.  ABD/GI/GU: Bowel sounds present. Soft. Non tender.  MSK/EXT:  Moves extremities. No apparent deformity. No edema.  SKIN: no apparent skin lesion or wound NEURO: Awake and alert.  Oriented to self and place.  Follows commands.  No apparent focal neuro deficit. PSYCH: Calm. Normal affect.   Discharge Instructions  Discharge Instructions    Diet general   Complete by: As directed     Dysphagia 3 diet   Increase activity slowly   Complete by: As directed      Allergies as of 07/16/2020      Reactions   Alendronate Other (See Comments)   Esophageal burning   Nitrofurantoin Nausea Only, Nausea And Vomiting   Sulfa Antibiotics Other (See Comments)   Cefdinir    Ciprofloxacin Other (See Comments)   Other Reaction: GI UPSET   Diphenhydramine Other (See Comments)   Lactose Intolerance (gi)       Medication List    STOP taking these medications   mirtazapine 15 MG tablet Commonly known as: REMERON     TAKE these medications   acetaminophen 500 MG tablet Commonly known as: TYLENOL Take 500 mg by mouth every 6 (six) hours as needed for mild pain.   amoxicillin-clavulanate 875-125 MG tablet Commonly known as: Augmentin Take 1 tablet by mouth 2 (two) times daily for 3 days.   azelastine 0.1 % nasal spray Commonly known as: ASTELIN Place 2 sprays into both nostrils 2 (two) times daily as needed for rhinitis. Use in each nostril as directed   benazepril 5 MG tablet Commonly known as: LOTENSIN Take 1 tablet by mouth daily.   cholecalciferol 25 MCG (1000 UNIT) tablet Commonly known as: VITAMIN D3 Take 1,000 Units by mouth daily.   donepezil 5 MG tablet Commonly known as: ARICEPT Take 5 mg by mouth at bedtime.   estradiol 1 MG tablet Commonly known as: ESTRACE Take 1 mg by mouth daily.   guaiFENesin 600 MG 12 hr tablet Commonly known as: MUCINEX Take 1 tablet (600 mg total) by mouth 2 (two) times daily for 3 days.   OCUVITE ADULT 50+ PO Take 1 tablet by mouth daily.   omeprazole 40 MG capsule Commonly known as: PRILOSEC Take 40 mg by mouth daily.   polyethylene glycol 17 g packet Commonly known as: MIRALAX / GLYCOLAX Take 17 g by mouth daily as needed for moderate constipation.   vitamin B-12 1000 MCG tablet Commonly known as: CYANOCOBALAMIN Take 1,000 mcg by mouth daily.       Consultations:  Infectious  disease  Gynecology  Procedures/Studies:  Pessary removal   DG Cervical Spine 2 or 3 views  Result Date: 07/13/2020 CLINICAL DATA:  Neck pain since recent fall EXAM: CERVICAL SPINE - 2-3 VIEW COMPARISON:  Cervical spine CT May 03, 2019. FINDINGS: Six cervical type vertebral bodies are well visualized on the lateral view. There is no evidence of cervical spine fracture or prevertebral soft tissue swelling. Similar extension weighted cervical lordosis. No evidence of listhesis. The dens and lateral masses appear intact. No other significant bone abnormalities are identified. No thoracic vertebral augmentation. IMPRESSION: No acute findings in the cervical spine. Please note CT is more sensitive for cervical spine fracture. Electronically Signed   By: Dahlia Bailiff MD   On: 07/13/2020 15:48   DG Chest Port 1 View  Result Date: 07/12/2020 CLINICAL DATA:  Questionable sepsis.  Generalized weakness. EXAM: PORTABLE CHEST 1 VIEW COMPARISON:  Chest x-ray dated 05/03/2019 and 12/05/2016. FINDINGS: Questionable small opacity at the RIGHT lung base. Lungs otherwise clear. No pleural effusion or pneumothorax is seen. Heart size and mediastinal contours are stable. No acute appearing osseous abnormality. IMPRESSION: Questionable small opacity at  the RIGHT lung base. This could represent pneumonia, aspiration or atelectasis. Electronically Signed   By: Franki Cabot M.D.   On: 07/12/2020 22:11        The results of significant diagnostics from this hospitalization (including imaging, microbiology, ancillary and laboratory) are listed below for reference.     Microbiology: Recent Results (from the past 240 hour(s))  Blood Culture (routine x 2)     Status: None (Preliminary result)   Collection Time: 07/12/20  9:33 PM   Specimen: BLOOD  Result Value Ref Range Status   Specimen Description BLOOD BLOOD RIGHT HAND  Final   Special Requests   Final    BOTTLES DRAWN AEROBIC AND ANAEROBIC Blood Culture  adequate volume   Culture   Final    NO GROWTH 4 DAYS Performed at [facility], [address], [place], [place] [address]    Report Status PENDING  Incomplete  Blood Culture (routine x 2)     Status: None (Preliminary result)   Collection Time: 07/12/20  9:38 PM   Specimen: BLOOD  Result Value Ref Range Status   Specimen Description BLOOD RIGHT ANTECUBITAL  Final   Special Requests   Final    BOTTLES DRAWN AEROBIC AND ANAEROBIC Blood Culture results may not be optimal due to an inadequate volume of blood received in culture bottles   Culture   Final    NO GROWTH 4 DAYS Performed at [facility], [address] [place], [place] [address]    Report Status PENDING  Incomplete  Resp Panel by RT-PCR (Flu A&B, Covid) Nasopharyngeal Swab     Status: None   Collection Time: 07/12/20  9:40 PM   Specimen: Nasopharyngeal Swab; Nasopharyngeal(NP) swabs in vial transport medium  Result Value Ref Range Status   SARS Coronavirus 2 by RT PCR NEGATIVE NEGATIVE Final    Comment: (NOTE) SARS-CoV-2 target nucleic acids are NOT DETECTED.  The SARS-CoV-2 RNA is generally detectable in upper respiratory specimens during the acute phase of infection. The lowest concentration of SARS-CoV-2 viral copies this assay can detect is 138 copies/mL. A negative result does not preclude SARS-Cov-2 infection and should not be used as the sole basis for treatment or other patient management decisions. A negative result may occur with  improper specimen collection/handling, submission of specimen other than nasopharyngeal swab, presence of viral mutation(s) within the areas targeted by this assay, and inadequate number of viral copies(<138 copies/mL). A negative result must be combined with clinical observations, patient history, and epidemiological information. The expected result is Negative.  Fact Sheet for Patients:  EntrepreneurPulse.com.au  Fact Sheet for Healthcare  Providers:  IncredibleEmployment.be  This test is no t yet approved or cleared by the [place] FDA and  has been authorized for detection and/or diagnosis of SARS-CoV-2 by FDA under an Emergency Use Authorization (EUA). This EUA will remain  in effect (meaning this test can be used) for the duration of the COVID-19 declaration under Section 564(b)(1) of the Act, 21 U.S.C.section 360bbb-3(b)(1), unless the authorization is terminated  or revoked sooner.       Influenza A by PCR NEGATIVE NEGATIVE Final   Influenza B by PCR NEGATIVE NEGATIVE Final    Comment: (NOTE) The Xpert Xpress SARS-CoV-2/FLU/RSV plus assay is intended as an aid in the diagnosis of influenza from Nasopharyngeal swab specimens and should not be used as a sole basis for treatment. Nasal washings and aspirates are unacceptable for Xpert Xpress SARS-CoV-2/FLU/RSV testing.  Fact Sheet for Patients:  EntrepreneurPulse.com.au  Fact Sheet for Healthcare Providers: IncredibleEmployment.be  This test is not yet approved or cleared by the [place] FDA and has been authorized for detection and/or diagnosis of SARS-CoV-2 by FDA under an Emergency Use Authorization (EUA). This EUA will remain in effect (meaning this test can be used) for the duration of the COVID-19 declaration under Section 564(b)(1) of the Act, 21 U.S.C. section 360bbb-3(b)(1), unless the authorization is terminated or revoked.  Performed at [facility], [address] [place], [place] [address]   Urine culture     Status: Abnormal   Collection Time: 07/12/20 10:18 PM   Specimen: In/Out Cath Urine  Result Value Ref Range Status   Specimen Description   Final    IN/OUT CATH URINE Performed at [facility], [address] [place], [place] [address]    Special Requests   Final    NONE Performed at [facility], [address] [place], [place]  [address]    Culture >=100,000 COLONIES/mL SERRATIA LIQUEFACIENS (A)  Final   Report Status 07/15/2020 FINAL  Final   Organism ID, Bacteria SERRATIA LIQUEFACIENS (A)  Final      Susceptibility   Serratia liquefaciens - MIC*    CEFAZOLIN >=64 RESISTANT Resistant     CEFEPIME <=0.12 SENSITIVE Sensitive     CEFTRIAXONE <=0.25 SENSITIVE Sensitive     CIPROFLOXACIN <=0.25 SENSITIVE Sensitive     GENTAMICIN <=1 SENSITIVE Sensitive     IMIPENEM 1 SENSITIVE Sensitive     NITROFURANTOIN 128 RESISTANT Resistant     TRIMETH/SULFA <=20 SENSITIVE Sensitive     PIP/TAZO <=4 SENSITIVE Sensitive     * >=100,000 COLONIES/mL SERRATIA LIQUEFACIENS  SARS CORONAVIRUS 2 (TAT 6-24 HRS) Nasopharyngeal Nasopharyngeal Swab     Status: None   Collection Time: 07/15/20 12:27 PM   Specimen: Nasopharyngeal Swab  Result Value Ref Range Status   SARS Coronavirus 2 NEGATIVE NEGATIVE Final    Comment: (NOTE) SARS-CoV-2 target nucleic acids are NOT DETECTED.  The SARS-CoV-2 RNA is generally detectable in upper and lower respiratory specimens during the acute phase of infection. Negative results do not preclude SARS-CoV-2 infection, do not rule out co-infections with other pathogens, and should not be used as the sole basis for treatment or other patient management decisions. Negative results must be combined with clinical observations, patient history, and epidemiological information. The expected result is Negative.  Fact Sheet for Patients: SugarRoll.be  Fact Sheet for Healthcare Providers: https://www.woods-mathews.com/  This test is not yet approved or cleared by the [place] FDA and  has been authorized for detection and/or diagnosis of SARS-CoV-2 by FDA under an Emergency Use Authorization (EUA). This EUA will remain  in effect (meaning this test can be used) for the duration of the COVID-19 declaration under Se ction 564(b)(1) of the Act, 21 U.S.C. section  360bbb-3(b)(1), unless the authorization is terminated or revoked sooner.  Performed at [facility], [place] [address], [place], [place] [address]      Labs:  CBC: Recent Labs  Lab 07/12/20 2133 07/13/20 0600 07/14/20 0640 07/15/20 0748  WBC 16.6* 19.2* 13.2* 11.3*  NEUTROABS 14.7*  --   --   --   HGB 13.9 12.5 11.6* 11.3*  HCT 43.1 38.2 35.6* 34.6*  MCV 92.7 93.4 94.4 93.3  PLT 230 191 173 192   BMP &GFR Recent Labs  Lab 07/12/20 2133 07/13/20 0600 07/14/20 0640 07/15/20 0748  NA 140 140 140 138  K 3.6 3.4* 4.5  4.1  CL 104 107 108 104  CO2 26 25 26 22   GLUCOSE 126* 120* 102* 94  BUN 15 13 14 16   CREATININE 0.83 0.87 1.00 0.91  CALCIUM 9.3 8.4* 8.7* 8.5*   Estimated Creatinine Clearance: 45.4 mL/min (by C-G formula based on SCr of 0.91 mg/dL). Liver & Pancreas: Recent Labs  Lab 07/12/20 2133  AST 23  ALT 14  ALKPHOS 77  BILITOT 1.1  PROT 7.3  ALBUMIN 3.7   No results for input(s): LIPASE, AMYLASE in the last 168 hours. No results for input(s): AMMONIA in the last 168 hours. Diabetic: No results for input(s): HGBA1C in the last 72 hours. No results for input(s): GLUCAP in the last 168 hours. Cardiac Enzymes: No results for input(s): CKTOTAL, CKMB, CKMBINDEX, TROPONINI in the last 168 hours. No results for input(s): PROBNP in the last 8760 hours. Coagulation Profile: Recent Labs  Lab 07/13/20 0556  INR 1.1   Thyroid Function Tests: No results for input(s): TSH, T4TOTAL, FREET4, T3FREE, THYROIDAB in the last 72 hours. Lipid Profile: No results for input(s): CHOL, HDL, LDLCALC, TRIG, CHOLHDL, LDLDIRECT in the last 72 hours. Anemia Panel: No results for input(s): VITAMINB12, FOLATE, FERRITIN, TIBC, IRON, RETICCTPCT in the last 72 hours. Urine analysis:    Component Value Date/Time   COLORURINE YELLOW (A) 07/12/2020 2218   APPEARANCEUR HAZY (A) 07/12/2020 2218   APPEARANCEUR Clear 10/15/2014 1143   LABSPEC 1.012 07/12/2020 2218   LABSPEC  see comment 03/18/2012 1057   PHURINE 7.0 07/12/2020 2218   GLUCOSEU NEGATIVE 07/12/2020 2218   GLUCOSEU see comment 03/18/2012 1057   HGBUR LARGE (A) 07/12/2020 2218   BILIRUBINUR NEGATIVE 07/12/2020 2218   BILIRUBINUR neg 01/09/2019 1106   BILIRUBINUR Negative 10/15/2014 1143   BILIRUBINUR see comment 03/18/2012 [place] 07/12/2020 2218   PROTEINUR NEGATIVE 07/12/2020 2218   UROBILINOGEN 0.2 01/09/2019 1106   NITRITE NEGATIVE 07/12/2020 2218   LEUKOCYTESUR MODERATE (A) 07/12/2020 2218   LEUKOCYTESUR see comment 03/18/2012 1057   Sepsis Labs: Invalid input(s): PROCALCITONIN, LACTICIDVEN   Time coordinating discharge: 40 minutes  SIGNED:  Mercy Riding, MD  [facility] 07/16/2020, 12:30 PM  If 7PM-7AM, please contact night-coverage [email or web address]

## 2020-07-16 NOTE — Plan of Care (Signed)
End of shift summary: Patient slept comfortably throughout the night. Bladder scan performed d/t patient c/o urgency without relief. Bladder scan showed 390cc, repositioned patient and she was then able to void the remainder. VSS. No acute medical complaints at this time.   Problem: Education: Goal: Knowledge of General Education information will improve Description: Including pain rating scale, medication(s)/side effects and non-pharmacologic comfort measures Outcome: Progressing   Problem: Health Behavior/Discharge Planning: Goal: Ability to manage health-related needs will improve Outcome: Progressing   Problem: Clinical Measurements: Goal: Ability to maintain clinical measurements within normal limits will improve Outcome: Progressing Goal: Will remain free from infection Outcome: Progressing Goal: Diagnostic test results will improve Outcome: Progressing   Problem: Activity: Goal: Risk for activity intolerance will decrease Outcome: Progressing   Problem: Nutrition: Goal: Adequate nutrition will be maintained Outcome: Progressing   Problem: Pain Managment: Goal: General experience of comfort will improve Outcome: Progressing   Problem: Safety: Goal: Ability to remain free from injury will improve Outcome: Progressing   Problem: Skin Integrity: Goal: Risk for impaired skin integrity will decrease Outcome: Progressing

## 2020-07-16 NOTE — Progress Notes (Signed)
Called report to [facility], talked to [place]. Getting paperwork ready. Gathered all items. Removed IV.

## 2020-07-16 NOTE — Telephone Encounter (Signed)
Patient is scheduled for 08/06/20 at 1:50. Patient's daughter [name] (on [place]) said patient is going to a rehab center and doesn't have any info on how long she will be there. Will call back to change appointment if need too

## 2020-07-16 NOTE — Discharge Instructions (Signed)

## 2020-07-16 NOTE — Telephone Encounter (Signed)
-----   Message from [place], LPN sent at 03/20/5619  8:50 AM EDT ----- Regarding: RE: Order pessary Pessary already in stock labeled for patient. OK to schedule appointment at patient convenience. ----- Message ----- From: Will Bonnet, MD Sent: 07/15/2020   3:19 PM EDT To: Otila Kluver, LPN Subject: Order pessary                                  Hi Crystal, This patient is in the hospital. I had to remove her pessary due to concern for the pessary being colonized with a bacteria. She will need another one ordered, a number 5 ring with a continence knob.   She will need follow up with Dr. Kenton Kingfisher to have it placed after she gets out of the hospital.  THanks

## 2020-07-17 LAB — CULTURE, BLOOD (ROUTINE X 2)
Culture: NO GROWTH
Culture: NO GROWTH
Special Requests: ADEQUATE

## 2020-08-06 ENCOUNTER — Ambulatory Visit: Payer: Medicare Other | Admitting: Obstetrics & Gynecology

## 2020-08-21 ENCOUNTER — Telehealth: Payer: Self-pay | Admitting: Obstetrics & Gynecology

## 2020-09-01 ENCOUNTER — Ambulatory Visit (INDEPENDENT_AMBULATORY_CARE_PROVIDER_SITE_OTHER): Payer: Medicare Other | Admitting: Obstetrics & Gynecology

## 2020-09-01 ENCOUNTER — Encounter: Payer: Self-pay | Admitting: Obstetrics & Gynecology

## 2020-09-01 ENCOUNTER — Other Ambulatory Visit: Payer: Self-pay

## 2020-09-01 VITALS — Wt 143.0 lb

## 2020-09-01 DIAGNOSIS — N393 Stress incontinence (female) (male): Secondary | ICD-10-CM | POA: Diagnosis not present

## 2020-09-01 DIAGNOSIS — N8111 Cystocele, midline: Secondary | ICD-10-CM | POA: Diagnosis not present

## 2020-09-01 NOTE — Progress Notes (Signed)
  HPI:      [name] is a 80 y.o. who presents today for her pessary follow up and examination related to her pelvic floor weakening. She had pneumonia in June in hospital and had IUD removed then as concern over infection.  She reports symptoms of pelvic floor weakening have greatly worsened since its removal (pressure, incontinence). She recently had a incontinence ring #5 pessary.  PMHx: She  has a past medical history of Allergy, Anxiety, Depression, Fatty liver disease, nonalcoholic, GERD (gastroesophageal reflux disease), Heart disease, Hypertension, Lung cancer ([place]) (02/2014), and Urinary incontinence. Also,  has a past surgical history that includes Laryngoscopy (N/A, 07/22/2014); Laparoscopic hysterectomy (1977); Lung cancer surgery; Abdominal hysterectomy; Kyphoplasty (N/A, 05/04/2019); and Neck surgery., family history includes Cancer in her maternal grandfather and maternal grandmother; Kidney cancer in her cousin.,  reports that she quit smoking about 14 years ago. Her smoking use included cigarettes. She has a 12.50 pack-year smoking history. She has never used smokeless tobacco. She reports current alcohol use of about 2.0 standard drinks of alcohol per week. She reports that she does not use drugs.  She has a current medication list which includes the following prescription(s): acetaminophen, azelastine, benazepril, cholecalciferol, donepezil, estradiol, multiple vitamins-minerals, nystatin, omeprazole, polyethylene glycol, vitamin b-12, and [DISCONTINUED] nortriptyline. Also, is allergic to alendronate, nitrofurantoin, sulfa antibiotics, cefdinir, ciprofloxacin, diphenhydramine, and lactose intolerance (gi).  Review of Systems  All other systems reviewed and are negative.  Objective: Wt 143 lb (64.9 kg)   BMI 23.08 kg/m  Physical Exam Constitutional:      General: She is not in acute distress.    Appearance: She is well-developed.  Genitourinary:     Genitourinary  Comments: Cuff intact/ no lesions  Absent uterus and cervix     No vaginal erythema or bleeding.  HENT:     Head: Normocephalic and atraumatic.     Nose: Nose normal.  Abdominal:     General: There is no distension.     Palpations: Abdomen is soft.     Tenderness: There is no abdominal tenderness.  Musculoskeletal:        General: Normal range of motion.  Neurological:     Mental Status: She is alert and oriented to person, place, and time.     Cranial Nerves: No cranial nerve deficit.  Skin:    General: Skin is warm and dry.  Psychiatric:        Attention and Perception: Attention normal.        Mood and Affect: Mood normal.        Speech: Speech normal.        Behavior: Behavior normal.        Cognition and Memory: Cognition normal.        Judgment: Judgment normal.    Pessary Care Pessary removed and cleaned.  Vagina checked - without erosions - pessary replaced.  A/P:   ICD-10-CM   1. Cystocele, midline  N81.11     2. Stress incontinence of urine  N39.3      A new Incontinence Ring #5 Pessary was placed today. Instructions given for care. Concerning symptoms to observe for are counseled to patient. Follow up scheduled for 3 months.  A total of 20 minutes were spent face-to-face with the patient as well as preparation, review, communication, and documentation during this encounter.   Barnett Applebaum, MD, [facility], [facility] 09/01/2020  10:50 AM

## 2020-09-03 ENCOUNTER — Emergency Department: Payer: Medicare Other

## 2020-09-03 ENCOUNTER — Other Ambulatory Visit: Payer: Self-pay

## 2020-09-03 DIAGNOSIS — N390 Urinary tract infection, site not specified: Principal | ICD-10-CM | POA: Insufficient documentation

## 2020-09-03 DIAGNOSIS — Z87891 Personal history of nicotine dependence: Secondary | ICD-10-CM | POA: Insufficient documentation

## 2020-09-03 DIAGNOSIS — B952 Enterococcus as the cause of diseases classified elsewhere: Secondary | ICD-10-CM | POA: Diagnosis not present

## 2020-09-03 DIAGNOSIS — F0390 Unspecified dementia without behavioral disturbance: Secondary | ICD-10-CM | POA: Diagnosis not present

## 2020-09-03 DIAGNOSIS — I129 Hypertensive chronic kidney disease with stage 1 through stage 4 chronic kidney disease, or unspecified chronic kidney disease: Secondary | ICD-10-CM | POA: Diagnosis not present

## 2020-09-03 DIAGNOSIS — U071 COVID-19: Secondary | ICD-10-CM | POA: Insufficient documentation

## 2020-09-03 DIAGNOSIS — N183 Chronic kidney disease, stage 3 unspecified: Secondary | ICD-10-CM | POA: Diagnosis not present

## 2020-09-03 DIAGNOSIS — Z85118 Personal history of other malignant neoplasm of bronchus and lung: Secondary | ICD-10-CM | POA: Insufficient documentation

## 2020-09-03 DIAGNOSIS — Z79899 Other long term (current) drug therapy: Secondary | ICD-10-CM | POA: Diagnosis not present

## 2020-09-03 DIAGNOSIS — R0602 Shortness of breath: Secondary | ICD-10-CM | POA: Diagnosis not present

## 2020-09-03 DIAGNOSIS — R509 Fever, unspecified: Secondary | ICD-10-CM | POA: Diagnosis present

## 2020-09-03 DIAGNOSIS — A419 Sepsis, unspecified organism: Secondary | ICD-10-CM

## 2020-09-03 LAB — CBC WITH DIFFERENTIAL/PLATELET
Abs Immature Granulocytes: 0.03 10*3/uL (ref 0.00–0.07)
Basophils Absolute: 0.1 10*3/uL (ref 0.0–0.1)
Basophils Relative: 1 %
Eosinophils Absolute: 0.1 10*3/uL (ref 0.0–0.5)
Eosinophils Relative: 1 %
HCT: 42.3 % (ref 36.0–46.0)
Hemoglobin: 14.1 g/dL (ref 12.0–15.0)
Immature Granulocytes: 0 %
Lymphocytes Relative: 5 %
Lymphs Abs: 0.4 10*3/uL — ABNORMAL LOW (ref 0.7–4.0)
MCH: 31.6 pg (ref 26.0–34.0)
MCHC: 33.3 g/dL (ref 30.0–36.0)
MCV: 94.8 fL (ref 80.0–100.0)
Monocytes Absolute: 0.6 10*3/uL (ref 0.1–1.0)
Monocytes Relative: 7 %
Neutro Abs: 6.7 10*3/uL (ref 1.7–7.7)
Neutrophils Relative %: 86 %
Platelets: 223 10*3/uL (ref 150–400)
RBC: 4.46 MIL/uL (ref 3.87–5.11)
RDW: 12.7 % (ref 11.5–15.5)
WBC: 7.8 10*3/uL (ref 4.0–10.5)
nRBC: 0 % (ref 0.0–0.2)

## 2020-09-03 LAB — COMPREHENSIVE METABOLIC PANEL
ALT: 19 U/L (ref 0–44)
AST: 25 U/L (ref 15–41)
Albumin: 4.3 g/dL (ref 3.5–5.0)
Alkaline Phosphatase: 75 U/L (ref 38–126)
Anion gap: 10 (ref 5–15)
BUN: 10 mg/dL (ref 8–23)
CO2: 27 mmol/L (ref 22–32)
Calcium: 9.8 mg/dL (ref 8.9–10.3)
Chloride: 98 mmol/L (ref 98–111)
Creatinine, Ser: 0.86 mg/dL (ref 0.44–1.00)
GFR, Estimated: >60 mL/min (ref >60)
Glucose, Bld: 127 mg/dL — ABNORMAL HIGH (ref 70–99)
Potassium: 4 mmol/L (ref 3.5–5.1)
Sodium: 135 mmol/L (ref 135–145)
Total Bilirubin: 0.7 mg/dL (ref 0.3–1.2)
Total Protein: 7.9 g/dL (ref 6.5–8.1)

## 2020-09-03 LAB — LACTIC ACID, PLASMA: Lactic Acid, Venous: 1 mmol/L (ref 0.5–1.9)

## 2020-09-03 LAB — PROTIME-INR
INR: 1 (ref 0.8–1.2)
Prothrombin Time: 12.7 seconds (ref 11.4–15.2)

## 2020-09-03 NOTE — ED Triage Notes (Signed)
Pt comes ems from a facility with fever, strong urine smell, tachycardia. Pt is incontinent. Aox4. EMS got fever of 101 and HR of 120.

## 2020-09-04 ENCOUNTER — Observation Stay
Admission: EM | Admit: 2020-09-04 | Discharge: 2020-09-05 | Disposition: A | Discharge status: Home / Self Care | Payer: Medicare Other | Attending: Internal Medicine | Admitting: Internal Medicine

## 2020-09-04 DIAGNOSIS — U071 COVID-19: Secondary | ICD-10-CM

## 2020-09-04 DIAGNOSIS — R531 Weakness: Secondary | ICD-10-CM

## 2020-09-04 DIAGNOSIS — I10 Essential (primary) hypertension: Secondary | ICD-10-CM | POA: Diagnosis present

## 2020-09-04 DIAGNOSIS — N390 Urinary tract infection, site not specified: Principal | ICD-10-CM | POA: Diagnosis present

## 2020-09-04 DIAGNOSIS — A419 Sepsis, unspecified organism: Secondary | ICD-10-CM

## 2020-09-04 LAB — CREATININE, SERUM
Creatinine, Ser: 0.97 mg/dL (ref 0.44–1.00)
GFR, Estimated: 59 mL/min — ABNORMAL LOW (ref >60)

## 2020-09-04 LAB — RESP PANEL BY RT-PCR (FLU A&B, COVID) ARPGX2
Influenza A by PCR: NEGATIVE
Influenza B by PCR: NEGATIVE
SARS Coronavirus 2 by RT PCR: POSITIVE — AB

## 2020-09-04 LAB — URINALYSIS, COMPLETE (UACMP) WITH MICROSCOPIC
Bacteria, UA: NONE SEEN
Bilirubin Urine: NEGATIVE
Glucose, UA: NEGATIVE mg/dL
Hgb urine dipstick: NEGATIVE
Ketones, ur: 5 mg/dL — AB
Nitrite: NEGATIVE
Protein, ur: 30 mg/dL — AB
Specific Gravity, Urine: 1.018 (ref 1.005–1.030)
WBC, UA: >50 WBC/hpf — ABNORMAL HIGH (ref 0–5)
pH: 5 (ref 5.0–8.0)

## 2020-09-04 LAB — CBC
HCT: 36.4 % (ref 36.0–46.0)
Hemoglobin: 12.1 g/dL (ref 12.0–15.0)
MCH: 31.3 pg (ref 26.0–34.0)
MCHC: 33.2 g/dL (ref 30.0–36.0)
MCV: 94.3 fL (ref 80.0–100.0)
Platelets: 176 10*3/uL (ref 150–400)
RBC: 3.86 MIL/uL — ABNORMAL LOW (ref 3.87–5.11)
RDW: 13.1 % (ref 11.5–15.5)
WBC: 6.2 10*3/uL (ref 4.0–10.5)
nRBC: 0 % (ref 0.0–0.2)

## 2020-09-04 MED ORDER — ONDANSETRON HCL 4 MG PO TABS: 4.0000 mg | ORAL_TABLET | Freq: Four times a day (QID) | ORAL | Status: DC | PRN

## 2020-09-04 MED ORDER — SODIUM CHLORIDE 0.9 % IV SOLN
200.0000 mg | Freq: Once | INTRAVENOUS | Status: AC
  Administered 2020-09-04: 200 mg via INTRAVENOUS
  Filled 2020-09-04: qty 40

## 2020-09-04 MED ORDER — SODIUM CHLORIDE 0.9 % IV SOLN
100.0000 mg | Freq: Every day | INTRAVENOUS | Status: DC
  Administered 2020-09-05: 100 mg via INTRAVENOUS
  Filled 2020-09-04: qty 20
  Filled 2020-09-04: qty 100

## 2020-09-04 MED ORDER — SODIUM CHLORIDE 0.9 % IV SOLN
1.0000 g | Freq: Once | INTRAVENOUS | Status: AC
  Administered 2020-09-04: 1 g via INTRAVENOUS
  Filled 2020-09-04: qty 10

## 2020-09-04 MED ORDER — ONDANSETRON HCL 4 MG/2ML IJ SOLN: 4.0000 mg | Freq: Four times a day (QID) | INTRAMUSCULAR | Status: DC | PRN

## 2020-09-04 MED ORDER — ACETAMINOPHEN 325 MG PO TABS
650.0000 mg | ORAL_TABLET | Freq: Four times a day (QID) | ORAL | Status: DC | PRN
  Administered 2020-09-04: 650 mg via ORAL
  Filled 2020-09-04: qty 2

## 2020-09-04 MED ORDER — GUAIFENESIN-DM 100-10 MG/5ML PO SYRP: 10.0000 mL | ORAL_SOLUTION | ORAL | Status: DC | PRN

## 2020-09-04 MED ORDER — DULOXETINE HCL 20 MG PO CPEP
20.0000 mg | ORAL_CAPSULE | Freq: Every day | ORAL | Status: DC
  Administered 2020-09-04 – 2020-09-05 (×2): 20 mg via ORAL
  Filled 2020-09-04 (×2): qty 1

## 2020-09-04 MED ORDER — ACETAMINOPHEN 500 MG PO TABS
1000.0000 mg | ORAL_TABLET | Freq: Once | ORAL | Status: AC
  Administered 2020-09-04: 1000 mg via ORAL
  Filled 2020-09-04: qty 2

## 2020-09-04 MED ORDER — LACTULOSE 10 GM/15ML PO SOLN
20.0000 g | Freq: Once | ORAL | Status: DC
  Filled 2020-09-04: qty 30

## 2020-09-04 MED ORDER — ZINC SULFATE 220 (50 ZN) MG PO CAPS
220.0000 mg | ORAL_CAPSULE | Freq: Every day | ORAL | Status: DC
  Administered 2020-09-04 – 2020-09-05 (×2): 220 mg via ORAL
  Filled 2020-09-04 (×2): qty 1

## 2020-09-04 MED ORDER — BENAZEPRIL HCL 5 MG PO TABS
5.0000 mg | ORAL_TABLET | Freq: Every day | ORAL | Status: DC
  Administered 2020-09-04 – 2020-09-05 (×2): 5 mg via ORAL
  Filled 2020-09-04 (×4): qty 1

## 2020-09-04 MED ORDER — DONEPEZIL HCL 5 MG PO TABS
5.0000 mg | ORAL_TABLET | Freq: Every day | ORAL | Status: DC
  Administered 2020-09-04 – 2020-09-05 (×2): 5 mg via ORAL
  Filled 2020-09-04 (×2): qty 1

## 2020-09-04 MED ORDER — ASCORBIC ACID 500 MG PO TABS
500.0000 mg | ORAL_TABLET | Freq: Every day | ORAL | Status: DC
  Administered 2020-09-04 – 2020-09-05 (×2): 500 mg via ORAL
  Filled 2020-09-04 (×2): qty 1

## 2020-09-04 MED ORDER — SODIUM CHLORIDE 0.9 % IV SOLN
1.0000 g | INTRAVENOUS | Status: DC
  Administered 2020-09-05: 1 g via INTRAVENOUS
  Filled 2020-09-04: qty 10

## 2020-09-04 MED ORDER — ACETAMINOPHEN 650 MG RE SUPP: 650.0000 mg | Freq: Four times a day (QID) | RECTAL | Status: DC | PRN

## 2020-09-04 MED ORDER — SENNOSIDES-DOCUSATE SODIUM 8.6-50 MG PO TABS
2.0000 | ORAL_TABLET | Freq: Two times a day (BID) | ORAL | Status: DC
  Administered 2020-09-04 – 2020-09-05 (×3): 2 via ORAL
  Filled 2020-09-04 (×3): qty 2

## 2020-09-04 MED ORDER — ENOXAPARIN SODIUM 40 MG/0.4ML IJ SOSY
40.0000 mg | PREFILLED_SYRINGE | INTRAMUSCULAR | Status: DC
  Administered 2020-09-04 – 2020-09-05 (×2): 40 mg via SUBCUTANEOUS
  Filled 2020-09-04 (×2): qty 0.4

## 2020-09-04 NOTE — ED Notes (Signed)
Lab called to state patient is COVID+ MD and charge nurse made aware

## 2020-09-04 NOTE — ED Notes (Signed)
Gave patient a warm blanket and drew am labs

## 2020-09-04 NOTE — ED Provider Notes (Signed)
[facility] Emergency Department Provider Note  ____________________________________________   Event Date/Time   First MD Initiated Contact with Patient 09/04/20 0155     (approximate)  I have reviewed the triage vital signs and the nursing notes.   HISTORY  Chief Complaint Urinary Tract Infection, Fever, and Nausea    HPI Christy [name] is a 80 y.o. female with history of hypertension, dementia who presents to the emergency department with EMS from [facility] for concerns for not feeling well.  EMS reports patient had a fever of 101 and was tachycardic to the 120s.  Patient tells me she has been having sore throat, cough, shortness of breath, dysuria for 1 month.  She denies any chest pain, abdominal pain, vomiting or diarrhea.  She states she has been vaccinated against COVID-19 and influenza.        Past Medical History:  Diagnosis Date   Allergy    Anxiety    Depression    Fatty liver disease, nonalcoholic    GERD (gastroesophageal reflux disease)    Heart disease    Hypertension    Lung cancer ([place]) 02/2014   RUL Lobectomy   Urinary incontinence     Patient Active Problem List   Diagnosis Date Noted   Ataxia 07/24/2019   Confusion 07/24/2019   Sepsis ([employer]) 05/22/2019   Sepsis secondary to UTI ([place]) 05/22/2019   Hypotension 05/22/2019   Acute kidney injury superimposed on CKD ([place]) 05/22/2019   Compression fracture of body of thoracic vertebra ([place]) 05/04/2019   Compression fracture of C-spine ([place]) 05/04/2019   Fall 05/03/2019   Anxiety, generalized 03/19/2019   Exertional shortness of breath 10/03/2018   Heart palpitations 10/02/2018   Mild cognitive impairment 03/28/2018   Aortic atherosclerosis ([place]) 02/24/2018   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   Cystocele, midline 04/28/2017   Recurrent major depressive disorder, in partial remission ([place]) 03/17/2017   Neuropathy 03/01/2017   Lumbosacral  radiculopathy 02/16/2017   Bilateral pain of leg and foot 12/16/2016   Fatigue 12/16/2016   Trochanteric bursitis of both hips 12/16/2016   Weakness of both legs 12/16/2016   Stress incontinence of urine 10/15/2014   Polypharmacy 09/02/2014   Primary cancer of right upper lobe of lung ([place]) 07/19/2014   Acid reflux 07/18/2014   Adaptive colitis 07/18/2014   Osteoporosis, 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   Chronic kidney disease (CKD), stage III (moderate) ([place]) 08/30/2013   Benign essential HTN 08/30/2013   Fatty liver disease, nonalcoholic 11/94/1740   Fibrositis 08/30/2013   Hot flash, menopausal 08/30/2013   Billowing mitral valve 08/30/2013   Allergic rhinitis, seasonal 08/30/2013   Fibromyalgia 08/30/2013    Past Surgical History:  Procedure Laterality Date   ABDOMINAL HYSTERECTOMY     KYPHOPLASTY N/A 05/04/2019   Procedure: KYPHOPLASTY T7;  Surgeon: Hessie Knows, MD;  Location: [facility] ORS;  Service: Orthopedics;  Laterality: N/A;   LAPAROSCOPIC HYSTERECTOMY  1977   LARYNGOSCOPY N/A 07/22/2014   Procedure: JET LARYNGOSCOPY with right vocal cord biopsy;  Surgeon: Margaretha Sheffield, MD;  Location: [facility] ORS;  Service: ENT;  Laterality: N/A;   LUNG CANCER SURGERY     NECK SURGERY      Prior to Admission medications   Medication Sig Start Date End Date Taking? Authorizing Provider  acetaminophen (TYLENOL) 500 MG tablet Take 500 mg by mouth every 6 (six) hours as needed for mild pain.    [provider]  azelastine (ASTELIN) 0.1 % nasal spray Place 2 sprays into  both nostrils 2 (two) times daily as needed for rhinitis. Use in each nostril as directed    [provider]  benazepril (LOTENSIN) 5 MG tablet Take 1 tablet by mouth daily. 03/04/14   [provider]  cholecalciferol (VITAMIN D3) 25 MCG (1000 UNIT) tablet Take 1,000 Units by mouth daily.    [provider]  donepezil (ARICEPT) 5 MG tablet Take 5 mg by mouth at bedtime.    [provider]   estradiol (ESTRACE) 1 MG tablet Take 1 mg by mouth daily.  04/29/17   [provider]  Multiple Vitamins-Minerals (OCUVITE ADULT 50+ PO) Take 1 tablet by mouth daily.     [provider]  nystatin (MYCOSTATIN) 100000 UNIT/ML suspension Take 5 mLs (500,000 Units total) by mouth 4 (four) times daily. 07/16/20   Mercy Riding, MD  omeprazole (PRILOSEC) 40 MG capsule Take 40 mg by mouth daily.     [provider]  polyethylene glycol (MIRALAX / GLYCOLAX) 17 g packet Take 17 g by mouth daily as needed for moderate constipation.    [provider]  vitamin B-12 (CYANOCOBALAMIN) 1000 MCG tablet Take 1,000 mcg by mouth daily.     [provider]  nortriptyline (PAMELOR) 10 MG capsule Take 20 mg by mouth at bedtime.  03/21/19 02/04/20  [provider]    Allergies Alendronate, Nitrofurantoin, Sulfa antibiotics, Cefdinir, Ciprofloxacin, Diphenhydramine, and Lactose intolerance (gi)  Family History  Problem Relation Age of Onset   Kidney cancer Cousin    Cancer Maternal Grandfather    Cancer Maternal Grandmother    Kidney disease Neg Hx    Prostate cancer Neg Hx     Social History Social History   Tobacco Use   Smoking status: Former    Packs/day: 0.50    Years: 25.00    Pack years: 12.50    Types: Cigarettes    Quit date: 07/18/2006    Years since quitting: 14.1   Smokeless tobacco: Never  Vaping Use   Vaping Use: Never used  Substance Use Topics   Alcohol use: Yes    Alcohol/week: 2.0 standard drinks    Types: 1 Glasses of wine, 1 Cans of beer per week    Comment: occassionally about 4 times a year   Drug use: No    Review of Systems Level 5 caveat secondary to dementia  ____________________________________________   PHYSICAL EXAM:  VITAL SIGNS: ED Triage Vitals [09/03/20 1802]  Enc Vitals Group     BP (!) 154/76     Pulse Rate (!) 114     Resp 18     Temp 99.6 F (37.6 C)     Temp Source Oral     SpO2 97 %      Weight 143 lb 4.8 oz (65 kg)     Height 5\' 5"  (1.651 m)     Head Circumference      Peak Flow      Pain Score 0     Pain Loc      Pain Edu?      Excl. in [place]?    CONSTITUTIONAL: Alert and oriented x 3 and responds appropriately to questions. Well-appearing; well-nourished, nontoxic-appearing HEAD: Normocephalic EYES: Conjunctivae clear, pupils appear equal, EOM appear intact ENT: normal nose; moist mucous membranes; No pharyngeal erythema or petechiae, no tonsillar hypertrophy or exudate, no uvular deviation, no unilateral swelling, no trismus or drooling, no muffled voice, normal phonation, no stridor, no dental caries present, no drainable dental  abscess noted, no Ludwig's angina, tongue sits flat in the bottom of the mouth, no angioedema, no facial erythema or warmth, no facial swelling; no pain with movement of the neck, no cervical LAD. NECK: Supple, normal ROM CARD: Regular and tachycardic; S1 and S2 appreciated; no murmurs, no clicks, no rubs, no gallops RESP: Normal chest excursion without splinting or tachypnea; breath sounds clear and equal bilaterally; no wheezes, no rhonchi, no rales, no hypoxia or respiratory distress, speaking full sentences ABD/GI: Normal bowel sounds; non-distended; soft, non-tender, no rebound, no guarding, no peritoneal signs, no hepatosplenomegaly BACK: The back appears normal EXT: Normal ROM in all joints; no deformity noted, no edema; no cyanosis SKIN: Normal color for age and race; warm; no rash on exposed skin NEURO: Moves all extremities equally, normal speech, no facial asymmetry PSYCH: The patient's mood and manner are appropriate.  ____________________________________________   LABS (all labs ordered are listed, but only abnormal results are displayed)  Labs Reviewed  RESP PANEL BY RT-PCR (FLU A&B, COVID) ARPGX2 - Abnormal; Notable for the following components:      Result Value   SARS Coronavirus 2 by RT PCR POSITIVE (*)    All other  components within normal limits  COMPREHENSIVE METABOLIC PANEL - Abnormal; Notable for the following components:   Glucose, Bld 127 (*)    All other components within normal limits  CBC WITH DIFFERENTIAL/PLATELET - Abnormal; Notable for the following components:   Lymphs Abs 0.4 (*)    All other components within normal limits  URINALYSIS, COMPLETE (UACMP) WITH MICROSCOPIC - Abnormal; Notable for the following components:   Color, Urine AMBER (*)    APPearance CLOUDY (*)    Ketones, ur 5 (*)    Protein, ur 30 (*)    Leukocytes,Ua LARGE (*)    WBC, UA >50 (*)    All other components within normal limits  CULTURE, BLOOD (ROUTINE X 2)  URINE CULTURE  CULTURE, BLOOD (ROUTINE X 2) W REFLEX TO ID PANEL  LACTIC ACID, PLASMA  PROTIME-INR   ____________________________________________  EKG   EKG Interpretation  Date/Time:  Thursday September 04 2020 02:49:46 EDT Ventricular Rate:  88 PR Interval:  140 QRS Duration: 78 QT Interval:  362 QTC Calculation: 438 R Axis:   65 Text Interpretation: Sinus rhythm with marked sinus arrhythmia Nonspecific ST abnormality Abnormal ECG Confirmed by Pryor Curia [phone]) on 09/04/2020 2:52:16 AM        ____________________________________________  RADIOLOGY Jessie Foot Shimshon Narula, personally viewed and evaluated these images (plain radiographs) as part of my medical decision making, as well as reviewing the written report by the radiologist.  ED MD interpretation: Chest x-ray clear.  Official radiology report(s): DG Chest 1 View  Result Date: 09/03/2020 CLINICAL DATA:  Sepsis EXAM: CHEST  1 VIEW COMPARISON:  07/12/2020 FINDINGS: Reverse lordotic projection. Midthoracic vertebral augmentation. Heart size within normal limits. The lungs appear clear. Right rib deformities from old fractures. IMPRESSION: 1.  No active cardiopulmonary disease is radiographically apparent. Electronically Signed   By: Van Clines M.D.   On: 09/03/2020 19:04     ____________________________________________   PROCEDURES  Procedure(s) performed (including Critical Care):  Procedures  CRITICAL CARE Performed by: Cyril Mourning Tekoa Hamor   Total critical care time: 45 minutes  Critical care time was exclusive of separately billable procedures and treating other patients.  Critical care was necessary to treat or prevent imminent or life-threatening deterioration.  Critical care was time spent personally by me on the following activities: development of treatment  plan with patient and/or surrogate as well as nursing, discussions with consultants, evaluation of patient's response to treatment, examination of patient, obtaining history from patient or surrogate, ordering and performing treatments and interventions, ordering and review of laboratory studies, ordering and review of radiographic studies, pulse oximetry and re-evaluation of patient's condition.  ____________________________________________   INITIAL IMPRESSION / ASSESSMENT AND PLAN / ED COURSE  As part of my medical decision making, I reviewed the following data within the [place] notes reviewed and incorporated, Labs reviewed , EKG interpreted , Old EKG reviewed, Old chart reviewed, Radiograph reviewed , Discussed with admitting physician , and Notes from prior ED visits         Patient here with fever, tachycardia.  Still tachycardic here but temperature has improved to 99.6.  Will repeat vital signs.  Labs obtained in triage are reassuring with normal white blood cell count, lactic.  Chest x-ray clear.  COVID, flu tests as well as urinalysis and urine culture pending.  Differential includes COVID-19, influenza, urinary tract infection, bacteremia.  ED PROGRESS  Patient's vital signs slowly improving.  No longer tachycardic.  Patient does appear to have a urinary tract infection is also positive for COVID-19.  Will give Rocephin which it appears she has  tolerated well in the past.  Urine and blood cultures pending.  We will start remdesivir for her COVID-19 given she is complaining of shortness of breath but has no hypoxia or increased work of breathing her chest x-ray today is clear.  Will discuss with medicine for admission given patient meets sepsis criteria (abnormal vitals, source of infection).  3:59 AM Discussed patient's case with hospitalist, Dr. Damita Dunnings.  I have recommended admission and patient (and family if present) agree with this plan. Admitting physician will place admission orders.   I reviewed all nursing notes, vitals, pertinent previous records and reviewed/interpreted all EKGs, lab and urine results, imaging (as available).  ____________________________________________   FINAL CLINICAL IMPRESSION(S) / ED DIAGNOSES  Final diagnoses:  Acute UTI  COVID-19     ED Discharge Orders     None       *Please note:  Christy [name] was evaluated in Emergency Department on 09/04/2020 for the symptoms described in the history of present illness. She was evaluated in the context of the global COVID-19 pandemic, which necessitated consideration that the patient might be at risk for infection with the SARS-CoV-2 virus that causes COVID-19. Institutional protocols and algorithms that pertain to the evaluation of patients at risk for COVID-19 are in a state of rapid change based on information released by regulatory bodies including the CDC and federal and state organizations. These policies and algorithms were followed during the patient's care in the ED.  Some ED evaluations and interventions may be delayed as a result of limited staffing during and the pandemic.*   Note:  This document was prepared using Dragon voice recognition software and may include unintentional dictation errors.    Zhoey Blackstock, Delice Bison, DO 09/04/20 [phone]

## 2020-09-04 NOTE — Progress Notes (Signed)
PROGRESS NOTE    Christy [name]  LAG:[id number] DOB: Dec 06, 1940 DOA: 09/04/2020 PCP: Ezequiel Kayser, MD (Inactive)   Chief complaint fever. Brief Narrative:  Christy [name] is a 80 y.o. female with medical history significant for HTN, mild dementia, chronic low back pain, depression, stress incontinence, hospitalized for [place] UTI from 6/4-6/8 who was sent in by facility with a fever of 101, tachycardia 120 and strong odor to urine. Patient was positive for COVID, she also has abnormal urine suggest UTI.  Treated with Rocephin.  Assessment & Plan:   Active Problems:   Benign essential HTN   Generalized weakness   UTI (urinary tract infection)   COVID-19 virus infection  #1.  UTI. Patient does not meet sepsis criteria, sepsis is ruled out. Continue Rocephin pending blood culture and urine culture.  2.  COVID-19 infection. Patient does not have hypoxemia.  X-ray did not show pneumonia. Continue symptomatic treatment.  3.  Essential hypertension. Continue home medicine   4.  Dementia.    DVT prophylaxis: Lovenox Code Status: full Family Communication:  Disposition Plan:    Status is: Inpatient  Remains inpatient appropriate because:IV treatments appropriate due to intensity of illness or inability to take PO and Inpatient level of care appropriate due to severity of illness  Dispo: The patient is from: ALF              Anticipated d/c is to: ALF              Patient currently is not medically stable to d/c.   Difficult to place patient No        No intake/output data recorded. No intake/output data recorded.     Consultants:  None  Procedures: None  Antimicrobials: Rocephin.  Subjective: Feels well today, no additional fever or chills. Some dysuria.  No hematuria pain No shortness of breath or cough. No chest pain palpitation.   Objective: Vitals:   09/04/20 0945 09/04/20 1000 09/04/20 1030 09/04/20 1230  BP:  (!) 142/60 (!) 145/58  (!) 141/62  Pulse: 66 63 72 77  Resp: (!) 22 13 16 19   Temp:      TempSrc:      SpO2: 98% 98% 99% 97%  Weight:      Height:       No intake or output data in the 24 hours ending 09/04/20 1409 Filed Weights   09/03/20 1802  Weight: 65 kg    Examination:  General exam: Appears calm and comfortable  Respiratory system: Clear to auscultation. Respiratory effort normal. Cardiovascular system: S1 & S2 heard, RRR. No JVD, murmurs, rubs, gallops or clicks. No pedal edema. Gastrointestinal system: Abdomen is nondistended, soft and nontender. No organomegaly or masses felt. Normal bowel sounds heard. Central nervous system: Alert and oriented. No focal neurological deficits. Extremities: Symmetric 5 x 5 power. Skin: No rashes, lesions or ulcers Psychiatry: Judgement and insight appear normal. Mood & affect appropriate.     Data Reviewed: I have personally reviewed following labs and imaging studies  CBC: Recent Labs  Lab 09/03/20 1805 09/04/20 0637  WBC 7.8 6.2  NEUTROABS 6.7  --   HGB 14.1 12.1  HCT 42.3 36.4  MCV 94.8 94.3  PLT 223 122   Basic Metabolic Panel: Recent Labs  Lab 09/03/20 1805 09/04/20 0637  NA 135  --   K 4.0  --   CL 98  --   CO2 27  --   GLUCOSE 127*  --   BUN  10  --   CREATININE 0.86 0.97  CALCIUM 9.8  --    GFR: Estimated Creatinine Clearance: 40.9 mL/min (by C-G formula based on SCr of 0.97 mg/dL). Liver Function Tests: Recent Labs  Lab 09/03/20 1805  AST 25  ALT 19  ALKPHOS 75  BILITOT 0.7  PROT 7.9  ALBUMIN 4.3   No results for input(s): LIPASE, AMYLASE in the last 168 hours. No results for input(s): AMMONIA in the last 168 hours. Coagulation Profile: Recent Labs  Lab 09/03/20 1805  INR 1.0   Cardiac Enzymes: No results for input(s): CKTOTAL, CKMB, CKMBINDEX, TROPONINI in the last 168 hours. BNP (last 3 results) No results for input(s): PROBNP in the last 8760 hours. HbA1C: No results for input(s): HGBA1C in the last 72  hours. CBG: No results for input(s): GLUCAP in the last 168 hours. Lipid Profile: No results for input(s): CHOL, HDL, LDLCALC, TRIG, CHOLHDL, LDLDIRECT in the last 72 hours. Thyroid Function Tests: No results for input(s): TSH, T4TOTAL, FREET4, T3FREE, THYROIDAB in the last 72 hours. Anemia Panel: No results for input(s): VITAMINB12, FOLATE, FERRITIN, TIBC, IRON, RETICCTPCT in the last 72 hours. Sepsis Labs: Recent Labs  Lab 09/03/20 1805  LATICACIDVEN 1.0    Recent Results (from the past 240 hour(s))  Culture, blood (Routine x 2)     Status: None (Preliminary result)   Collection Time: 09/03/20  6:05 PM   Specimen: BLOOD  Result Value Ref Range Status   Specimen Description BLOOD LEFT ANTECUBITAL  Final   Special Requests   Final    BOTTLES DRAWN AEROBIC AND ANAEROBIC Blood Culture adequate volume   Culture   Final    NO GROWTH < 12 HOURS Performed at [facility], [address] [place], [place] [address]    Report Status PENDING  Incomplete  Resp Panel by RT-PCR (Flu A&B, Covid) Nasopharyngeal Swab     Status: Abnormal   Collection Time: 09/04/20  2:33 AM   Specimen: Nasopharyngeal Swab; Nasopharyngeal(NP) swabs in vial transport medium  Result Value Ref Range Status   SARS Coronavirus 2 by RT PCR POSITIVE (A) NEGATIVE Final    Comment: RESULT CALLED TO, READ BACK BY AND VERIFIED WITH: SARA SCHIFFELBEIN RN [id number] 09/04/20 HNM (NOTE) SARS-CoV-2 target nucleic acids are DETECTED.  The SARS-CoV-2 RNA is generally detectable in upper respiratory specimens during the acute phase of infection. Positive results are indicative of the presence of the identified virus, but do not rule out bacterial infection or co-infection with other pathogens not detected by the test. Clinical correlation with patient history and other diagnostic information is necessary to determine patient infection status. The expected result is Negative.  Fact Sheet for  Patients: EntrepreneurPulse.com.au  Fact Sheet for Healthcare Providers: IncredibleEmployment.be  This test is not yet approved or cleared by the [place] FDA and  has been authorized for detection and/or diagnosis of SARS-CoV-2 by FDA under an Emergency Use Authorization (EUA).  This EUA will remain in effect (meaning this test ca n be used) for the duration of  the COVID-19 declaration under Section 564(b)(1) of the Act, 21 U.S.C. section 360bbb-3(b)(1), unless the authorization is terminated or revoked sooner.     Influenza A by PCR NEGATIVE NEGATIVE Final   Influenza B by PCR NEGATIVE NEGATIVE Final    Comment: (NOTE) The Xpert Xpress SARS-CoV-2/FLU/RSV plus assay is intended as an aid in the diagnosis of influenza from Nasopharyngeal swab specimens and should not be used as a sole basis for treatment.  Nasal washings and aspirates are unacceptable for Xpert Xpress SARS-CoV-2/FLU/RSV testing.  Fact Sheet for Patients: EntrepreneurPulse.com.au  Fact Sheet for Healthcare Providers: IncredibleEmployment.be  This test is not yet approved or cleared by the [place] FDA and has been authorized for detection and/or diagnosis of SARS-CoV-2 by FDA under an Emergency Use Authorization (EUA). This EUA will remain in effect (meaning this test can be used) for the duration of the COVID-19 declaration under Section 564(b)(1) of the Act, 21 U.S.C. section 360bbb-3(b)(1), unless the authorization is terminated or revoked.  Performed at [facility], [address] [place], [place] [address]          Radiology Studies: DG Chest 1 View  Result Date: 09/03/2020 CLINICAL DATA:  Sepsis EXAM: CHEST  1 VIEW COMPARISON:  07/12/2020 FINDINGS: Reverse lordotic projection. Midthoracic vertebral augmentation. Heart size within normal limits. The lungs appear clear. Right rib deformities from old  fractures. IMPRESSION: 1.  No active cardiopulmonary disease is radiographically apparent. Electronically Signed   By: Van Clines M.D.   On: 09/03/2020 19:04        Scheduled Meds:  vitamin C  500 mg Oral Daily   enoxaparin (LOVENOX) injection  40 mg Subcutaneous Q24H   lactulose  20 g Oral Once   senna-docusate  2 tablet Oral BID   zinc sulfate  220 mg Oral Daily   Continuous Infusions:  [START ON 09/05/2020] remdesivir 100 mg in NS 100 mL       LOS: 0 days    Time spent: no charge    Sharen Hones, MD [facility]   To contact the attending provider between 7A-7P or the covering provider during after hours 7P-7A, please log into the web site [email or web address] and access using universal [place] password for that web site. If you do not have the password, please call the hospital operator.  09/04/2020, 2:09 PM

## 2020-09-04 NOTE — H&P (Signed)
History and Physical    Christy [name] VEH:[id number] DOB: [birth year] DOA: 09/04/2020  PCP: Christy Kayser, MD (Inactive)   Patient coming from: facility  I have personally briefly reviewed patient's old medical records in [place]  Chief Complaint: fever  HPI: Christy [name] is a 80 y.o. female with medical history significant for HTN, mild dementia, chronic low back pain, depression, stress incontinence, hospitalized for Serratia UTI from 6/4-6/8 who was sent in by facility with a fever of 101, tachycardia 120 and strong odor to urine.  Patient also reports waking up with a cough and sore throat but denies shortness of breath or chest pain.  Denies abdominal pain or diarrhea.  ED course: On arrival, temp 99.6 with pulse of 114, BP 154/76 with O2 sat 97% on room air. WBC normal with lactic acid of 1.  Urinalysis with large leukocyte esterase WBC greater than 50 per hpf..  CMP and CBC otherwise WNL  EKG, personally viewed and interpreted: Sinus at 88 with nonspecific ST-T wave changes  Imaging: Chest x-ray: No active disease  Patient started on Rocephin as well as remdesivir.  Hospitalist consulted for admission.  Review of Systems: As per HPI otherwise all other systems on review of systems negative.  History could be unreliable due to history of dementia   Past Medical History:  Diagnosis Date   Allergy    Anxiety    Depression    Fatty liver disease, nonalcoholic    GERD (gastroesophageal reflux disease)    Heart disease    Hypertension    Lung cancer ([place]) 02/2014   RUL Lobectomy   Urinary incontinence     Past Surgical History:  Procedure Laterality Date   ABDOMINAL HYSTERECTOMY     KYPHOPLASTY N/A 05/04/2019   Procedure: KYPHOPLASTY T7;  Surgeon: Hessie Knows, MD;  Location: [facility] ORS;  Service: Orthopedics;  Laterality: N/A;   LAPAROSCOPIC HYSTERECTOMY  1977   LARYNGOSCOPY N/A 07/22/2014   Procedure: JET LARYNGOSCOPY with right vocal cord biopsy;   Surgeon: Margaretha Sheffield, MD;  Location: [facility] ORS;  Service: ENT;  Laterality: N/A;   LUNG CANCER SURGERY     NECK SURGERY       reports that she quit smoking about 14 years ago. Her smoking use included cigarettes. She has a 12.50 pack-year smoking history. She has never used smokeless tobacco. She reports current alcohol use of about 2.0 standard drinks of alcohol per week. She reports that she does not use drugs.  Allergies  Allergen Reactions   Alendronate Other (See Comments)    Esophageal burning   Nitrofurantoin Nausea Only and Nausea And Vomiting   Sulfa Antibiotics Other (See Comments)   Cefdinir    Ciprofloxacin Other (See Comments)    Other Reaction: GI UPSET   Diphenhydramine Other (See Comments)   Lactose Intolerance (Gi)     Family History  Problem Relation Age of Onset   Kidney cancer Cousin    Cancer Maternal Grandfather    Cancer Maternal Grandmother    Kidney disease Neg Hx    Prostate cancer Neg Hx       Prior to Admission medications   Medication Sig Start Date End Date Taking? Authorizing Provider  acetaminophen (TYLENOL) 500 MG tablet Take 500 mg by mouth every 6 (six) hours as needed for mild pain.    [provider]  azelastine (ASTELIN) 0.1 % nasal spray Place 2 sprays into both nostrils 2 (two) times daily as needed for rhinitis. Use in  each nostril as directed    [provider]  benazepril (LOTENSIN) 5 MG tablet Take 1 tablet by mouth daily. 03/04/14   [provider]  cholecalciferol (VITAMIN D3) 25 MCG (1000 UNIT) tablet Take 1,000 Units by mouth daily.    [provider]  donepezil (ARICEPT) 5 MG tablet Take 5 mg by mouth at bedtime.    [provider]  estradiol (ESTRACE) 1 MG tablet Take 1 mg by mouth daily.  04/29/17   [provider]  Multiple Vitamins-Minerals (OCUVITE ADULT 50+ PO) Take 1 tablet by mouth daily.     [provider]  nystatin (MYCOSTATIN) 100000 UNIT/ML suspension Take  5 mLs (500,000 Units total) by mouth 4 (four) times daily. 07/16/20   Mercy Riding, MD  omeprazole (PRILOSEC) 40 MG capsule Take 40 mg by mouth daily.     [provider]  polyethylene glycol (MIRALAX / GLYCOLAX) 17 g packet Take 17 g by mouth daily as needed for moderate constipation.    [provider]  vitamin B-12 (CYANOCOBALAMIN) 1000 MCG tablet Take 1,000 mcg by mouth daily.     [provider]  nortriptyline (PAMELOR) 10 MG capsule Take 20 mg by mouth at bedtime.  03/21/19 02/04/20  [provider]    Physical Exam: Vitals:   09/03/20 1802 09/03/20 2240 09/04/20 0234  BP: (!) 154/76 125/78 (!) 143/80  Pulse: (!) 114 (!) 110 93  Resp: 18 18 18   Temp: 99.6 F (37.6 C)    TempSrc: Oral    SpO2: 97% 99% 98%  Weight: 65 kg    Height: 5\' 5"  (1.651 m)       Vitals:   09/03/20 1802 09/03/20 2240 09/04/20 0234  BP: (!) 154/76 125/78 (!) 143/80  Pulse: (!) 114 (!) 110 93  Resp: 18 18 18   Temp: 99.6 F (37.6 C)    TempSrc: Oral    SpO2: 97% 99% 98%  Weight: 65 kg    Height: 5\' 5"  (1.651 m)        Constitutional: Ill-appearing and somnolent and oriented x 2 . Not in any apparent distress HEENT:      Head: Normocephalic and atraumatic.         Eyes: PERLA, EOMI, Conjunctivae are normal. Sclera is non-icteric.       Mouth/Throat: Mucous membranes are moist.       Neck: Supple with no signs of meningismus. Cardiovascular: Regular rate and rhythm. No murmurs, gallops, or rubs. 2+ symmetrical distal pulses are present . No JVD. No LE edema Respiratory: Respiratory effort normal .Lungs sounds clear bilaterally. No wheezes, crackles, or rhonchi.  Gastrointestinal: Soft, non tender, and non distended with positive bowel sounds.  Genitourinary: No CVA tenderness. Musculoskeletal: Nontender with normal range of motion in all extremities. No cyanosis, or erythema of extremities. Neurologic:  Face is symmetric. Moving all extremities. No gross focal  neurologic deficits . Skin: Skin is warm, dry.  No rash or ulcers Psychiatric: Mood and affect are normal    Labs on Admission: I have personally reviewed following labs and imaging studies  CBC: Recent Labs  Lab 09/03/20 1805  WBC 7.8  NEUTROABS 6.7  HGB 14.1  HCT 42.3  MCV 94.8  PLT 076   Basic Metabolic Panel: Recent Labs  Lab 09/03/20 1805  NA 135  K 4.0  CL 98  CO2 27  GLUCOSE 127*  BUN 10  CREATININE 0.86  CALCIUM 9.8   GFR: Estimated Creatinine Clearance: 46.2 mL/min (  by C-G formula based on SCr of 0.86 mg/dL). Liver Function Tests: Recent Labs  Lab 09/03/20 1805  AST 25  ALT 19  ALKPHOS 75  BILITOT 0.7  PROT 7.9  ALBUMIN 4.3   No results for input(s): LIPASE, AMYLASE in the last 168 hours. No results for input(s): AMMONIA in the last 168 hours. Coagulation Profile: Recent Labs  Lab 09/03/20 1805  INR 1.0   Cardiac Enzymes: No results for input(s): CKTOTAL, CKMB, CKMBINDEX, TROPONINI in the last 168 hours. BNP (last 3 results) No results for input(s): PROBNP in the last 8760 hours. HbA1C: No results for input(s): HGBA1C in the last 72 hours. CBG: No results for input(s): GLUCAP in the last 168 hours. Lipid Profile: No results for input(s): CHOL, HDL, LDLCALC, TRIG, CHOLHDL, LDLDIRECT in the last 72 hours. Thyroid Function Tests: No results for input(s): TSH, T4TOTAL, FREET4, T3FREE, THYROIDAB in the last 72 hours. Anemia Panel: No results for input(s): VITAMINB12, FOLATE, FERRITIN, TIBC, IRON, RETICCTPCT in the last 72 hours. Urine analysis:    Component Value Date/Time   COLORURINE AMBER (A) 09/04/2020 0223   APPEARANCEUR CLOUDY (A) 09/04/2020 0223   APPEARANCEUR Clear 10/15/2014 1143   LABSPEC 1.018 09/04/2020 0223   LABSPEC see comment 03/18/2012 1057   PHURINE 5.0 09/04/2020 0223   GLUCOSEU NEGATIVE 09/04/2020 0223   GLUCOSEU see comment 03/18/2012 1057   HGBUR NEGATIVE 09/04/2020 0223   BILIRUBINUR NEGATIVE 09/04/2020 0223    BILIRUBINUR neg 01/09/2019 1106   BILIRUBINUR Negative 10/15/2014 1143   BILIRUBINUR see comment 03/18/2012 1057   KETONESUR 5 (A) 09/04/2020 0223   PROTEINUR 30 (A) 09/04/2020 0223   UROBILINOGEN 0.2 01/09/2019 1106   NITRITE NEGATIVE 09/04/2020 0223   LEUKOCYTESUR LARGE (A) 09/04/2020 0223   LEUKOCYTESUR see comment 03/18/2012 1057    Radiological Exams on Admission: DG Chest 1 View  Result Date: 09/03/2020 CLINICAL DATA:  Sepsis EXAM: CHEST  1 VIEW COMPARISON:  07/12/2020 FINDINGS: Reverse lordotic projection. Midthoracic vertebral augmentation. Heart size within normal limits. The lungs appear clear. Right rib deformities from old fractures. IMPRESSION: 1.  No active cardiopulmonary disease is radiographically apparent. Electronically Signed   By: Van Clines M.D.   On: 09/03/2020 19:04     Assessment/Plan 80 year old female with history of HTN, mild dementia, chronic low back pain, depression, stress incontinence, recent hospitalization for Serratia UTI from 6/4-6/8 who was sent in by facility with a fever of 101, tachycardia 120 and strong odor to urine.  COVID PCR positive    UTI (urinary tract infection) - IV Rocephin - Follow cultures - Had hospitalization for Serratia liquefaciens UTI in June 2022 sensitive to rocephin   COVID-19 virus infection -Patient reports 1 day of sore throat and cough.  Also has weakness and fever - Chest x-ray negative - We will continue remdesivir started in the emergency room - Monitor for cough, hypoxia - Vitamins, antitussives, albuterol as needed, supportive care - Airborne precautions  Essential hypertension - Continue benazepril  Dementia - Continue Aricept    DVT prophylaxis: Lovenox  Code Status: full code  Family Communication:  none  Disposition Plan: Back to previous home environment Consults called: none  Status:observation    Athena Masse MD [facility]     09/04/2020, 4:11 AM

## 2020-09-04 NOTE — ED Notes (Signed)
Taken over care of patient. Alert and oriented x4, does have covid- tested at her home facility,

## 2020-09-04 NOTE — Consult Note (Signed)
Remdesivir - Pharmacy Brief Note   O:  COVID+ with fever ALT: 19 CXR: No apparent disease SpO2: 98% on RA   A/P:  Remdesivir 200 mg IVPB once followed by 100 mg IVPB daily x 4 days.   Lu Duffel, PharmD, BCPS Clinical Pharmacist 09/04/2020 3:44 AM

## 2020-09-05 DIAGNOSIS — U071 COVID-19: Secondary | ICD-10-CM

## 2020-09-05 DIAGNOSIS — N3000 Acute cystitis without hematuria: Secondary | ICD-10-CM

## 2020-09-05 LAB — C-REACTIVE PROTEIN: CRP: 4.2 mg/dL — ABNORMAL HIGH (ref <1.0)

## 2020-09-05 LAB — CBC WITH DIFFERENTIAL/PLATELET
Abs Immature Granulocytes: 0.01 10*3/uL (ref 0.00–0.07)
Basophils Absolute: 0.1 10*3/uL (ref 0.0–0.1)
Basophils Relative: 1 %
Eosinophils Absolute: 0 10*3/uL (ref 0.0–0.5)
Eosinophils Relative: 1 %
HCT: 39.1 % (ref 36.0–46.0)
Hemoglobin: 12.9 g/dL (ref 12.0–15.0)
Immature Granulocytes: 0 %
Lymphocytes Relative: 19 %
Lymphs Abs: 1 10*3/uL (ref 0.7–4.0)
MCH: 30.7 pg (ref 26.0–34.0)
MCHC: 33 g/dL (ref 30.0–36.0)
MCV: 93.1 fL (ref 80.0–100.0)
Monocytes Absolute: 0.9 10*3/uL (ref 0.1–1.0)
Monocytes Relative: 16 %
Neutro Abs: 3.4 10*3/uL (ref 1.7–7.7)
Neutrophils Relative %: 63 %
Platelets: 211 10*3/uL (ref 150–400)
RBC: 4.2 MIL/uL (ref 3.87–5.11)
RDW: 13.2 % (ref 11.5–15.5)
WBC: 5.5 10*3/uL (ref 4.0–10.5)
nRBC: 0 % (ref 0.0–0.2)

## 2020-09-05 LAB — COMPREHENSIVE METABOLIC PANEL
ALT: 18 U/L (ref 0–44)
AST: 25 U/L (ref 15–41)
Albumin: 3.4 g/dL — ABNORMAL LOW (ref 3.5–5.0)
Alkaline Phosphatase: 62 U/L (ref 38–126)
Anion gap: 8 (ref 5–15)
BUN: 17 mg/dL (ref 8–23)
CO2: 28 mmol/L (ref 22–32)
Calcium: 8.9 mg/dL (ref 8.9–10.3)
Chloride: 103 mmol/L (ref 98–111)
Creatinine, Ser: 0.78 mg/dL (ref 0.44–1.00)
GFR, Estimated: >60 mL/min (ref >60)
Glucose, Bld: 100 mg/dL — ABNORMAL HIGH (ref 70–99)
Potassium: 3.9 mmol/L (ref 3.5–5.1)
Sodium: 139 mmol/L (ref 135–145)
Total Bilirubin: 0.6 mg/dL (ref 0.3–1.2)
Total Protein: 6.6 g/dL (ref 6.5–8.1)

## 2020-09-05 LAB — D-DIMER, QUANTITATIVE: D-Dimer, Quant: 0.8 ug/mL-FEU — ABNORMAL HIGH (ref 0.00–0.50)

## 2020-09-05 MED ORDER — FOSFOMYCIN TROMETHAMINE 3 G PO PACK
3.0000 g | PACK | Freq: Once | ORAL | Status: AC
  Administered 2020-09-05: 3 g via ORAL
  Filled 2020-09-05: qty 3

## 2020-09-05 MED ORDER — DICLOFENAC SODIUM 1 % EX GEL
1.0000 | Freq: Every day | CUTANEOUS | 0 refills | Status: DC
Start: 2020-09-05 — End: 2022-08-11

## 2020-09-05 MED ORDER — CEROVITE SENIOR PO TABS: 1.0000 | ORAL_TABLET | Freq: Every day | ORAL | 0 refills | Status: AC

## 2020-09-05 MED ORDER — ACETAMINOPHEN 500 MG PO TABS: 500.0000 mg | ORAL_TABLET | Freq: Every day | ORAL | 2 refills | Status: AC

## 2020-09-05 NOTE — Care Management CC44 (Signed)
Condition Code 44 Documentation Completed  Patient Details  Name: Christy [name] MRN: [id number] Date of Birth: 07-29-1940   Condition Code 44 given:  Yes Patient signature on Condition Code 44 notice:  Yes Documentation of 2 MD's agreement:  Yes Code 44 added to claim:  Yes  Completed via phone due to isolation status. Printed copy for patient.   Christy [name] Christy Prerna Harold, LCSW 09/05/2020, 8:26 AM

## 2020-09-05 NOTE — Discharge Summary (Signed)
Physician Discharge Summary  Patient ID: Christy [name] MRN: [id number] DOB/AGE: 03/13/1940 80 y.o.  Admit date: 09/04/2020 Discharge date: 09/05/2020  Admission Diagnoses:  Discharge Diagnoses:  Active Problems:   Benign essential HTN   Generalized weakness   UTI (urinary tract infection)   COVID-19 virus infection   Discharged Condition: good   Hospital Course:  Christy [name] is a 80 y.o. female with medical history significant for HTN, mild dementia, chronic low back pain, depression, stress incontinence, hospitalized for Serratia UTI from 6/4-6/8 who was sent in by facility with a fever of 101, tachycardia 120 and strong odor to urine. Patient was positive for COVID, she also has abnormal urine suggest UTI.  Treated with Rocephin.   #1.  UTI secondary to Enterococcus Faecalis Patient does not meet sepsis criteria, sepsis is ruled out. Patient blood cultures so far had no growth, urine culture growing Enterococcus. Patient condition seem to be improving, I will treat her with 1 dose of fosfomycin before discharge.  2.  COVID-19 infection. Patient does not have hypoxemia.  X-ray did not show pneumonia. He received 2 doses of remdesivir.  No additional treatment is needed as patient does not have any hypoxemia or pneumonia.  3.  Essential hypertension. Continue home medicine   4.  Dementia.   #5.  Prognosis. System has identified the patient has a high risk of 12 months mortality.  Please refer to palliative care as outpatient.   Consults: None   Significant Diagnostic Studies:        Treatments: antibiotics   Discharge Exam: Blood pressure (!) 155/70, pulse 72, temperature 98.5 F (36.9 C), resp. rate 16, height 5\' 5"  (1.651 m), weight 65 kg, SpO2 96 %. General appearance: alert and cooperative Resp: clear to auscultation bilaterally Cardio: regular rate and rhythm, S1, S2 normal, no murmur, click, rub or gallop GI: soft, non-tender; bowel sounds  normal; no masses,  no organomegaly Extremities: extremities normal, atraumatic, no cyanosis or edema   Disposition: Discharge disposition: 01-Home or Self Care  Discharge Instructions     Diet general   Complete by: As directed    No added salt, no concentrated sweets   Increase activity slowly   Complete by: As directed       Allergies as of 09/05/2020       Reactions   Alendronate Other (See Comments)   Esophageal burning   Nitrofurantoin Nausea Only, Nausea And Vomiting   Sulfa Antibiotics Other (See Comments)   Cefdinir    Ciprofloxacin Other (See Comments)   Other Reaction: GI UPSET   Diphenhydramine Other (See Comments)   Lactose Intolerance (gi)         Medication List     TAKE these medications    acetaminophen 500 MG tablet Commonly known as: TYLENOL Take 500 mg by mouth every 6 (six) hours as needed for mild pain. What changed: Another medication with the same name was added. Make sure you understand how and when to take each.   acetaminophen 500 MG tablet Commonly known as: TYLENOL Take 1 tablet (500 mg total) by mouth at bedtime. What changed: You were already taking a medication with the same name, and this prescription was added. Make sure you understand how and when to take each.   Acidophilus Chew Chew 1 tablet by mouth 2 (two) times daily.   azelastine 0.1 % nasal spray Commonly known as: ASTELIN Place 2 sprays into both nostrils 2 (two) times daily as needed for rhinitis. Use  in each nostril as directed   benazepril 5 MG tablet Commonly known as: LOTENSIN Take 1 tablet by mouth daily.   Cerovite Senior Tabs Take 1 tablet by mouth daily at 6 (six) AM. What changed:  when to take this Another medication with the same name was removed. Continue taking this medication, and follow the directions you see here.   cholecalciferol 25 MCG (1000 UNIT) tablet Commonly known as: VITAMIN D3 Take 1,000 Units by mouth daily.   diclofenac Sodium 1 %  Gel Commonly known as: VOLTAREN Apply 1 application topically daily. What changed:  when to take this reasons to take this   donepezil 5 MG tablet Commonly known as: ARICEPT Take 5 mg by mouth at bedtime.   DULoxetine 20 MG capsule Commonly known as: CYMBALTA Take 20 mg by mouth at bedtime.   estradiol 1 MG tablet Commonly known as: ESTRACE Take 1 mg by mouth daily.   nystatin 100000 UNIT/ML suspension Commonly known as: MYCOSTATIN Take 5 mLs (500,000 Units total) by mouth 4 (four) times daily.   omeprazole 40 MG capsule Commonly known as: PRILOSEC Take 40 mg by mouth daily.   polyethylene glycol 17 g packet Commonly known as: MIRALAX / GLYCOLAX Take 17 g by mouth daily as needed for moderate constipation.   vitamin B-12 1000 MCG tablet Commonly known as: CYANOCOBALAMIN Take 1,000 mcg by mouth daily.        Follow-up Information     Ezequiel Kayser, MD Follow up in 1 week(s).   Specialty: Internal Medicine Contact information: [place] [address] [phone]                 Signed: Sharen Hones 09/05/2020, 4:29 PM

## 2020-09-05 NOTE — TOC Initial Note (Addendum)
Transition of Care [facility]) - Initial/Assessment Note    Patient Details  Name: Christy [name] MRN: [id number] Date of Birth: Sep 03, 1940  Transition of Care [facility]) CM/SW Contact:    Magnus Ivan, LCSW Phone Number: 09/05/2020, 8:41 AM  Clinical Narrative:            Spoke to patient via phone due to airborne isolation. Patient confirms she is a resident at [facility] and plans to return there when discharged. Awaiting PT eval to determine level of care needed.   2:30- PT rec back to ALF with home health. [place], left VM for [place] requesting a return call to confirm they can take patient back.  3:10- [facility] again. Izora Gala reported she will need to review DC Summary to ensure they can meet patient's needs before patient can be discharged back to ALF today. Sent via secure email as Izora Gala reported their fax machine is down.  3:37- Left VM for daughter regarding possible DC back to ALF today. Called Nancy at [facility] to follow up. She reported the diet, medication list will need to be changed before they can take patient back otherwise they cannot take her until Monday. Updated MD. Izora Gala also stated family would have to transport patient back to ALF.  4:10- Called Pharmacy per MD request and asked them to update med list to match home med list from ALF.  4:25- Attempted calls again to patient's daughter and husband regarding transport back to ALF, unable to reach. Called Izora Gala at [facility] who stated she will text daughter to let her know.   4:35- Sent updated DC summary to [place]. Izora Gala will let family know they need to pick patient up. RN aware. No further TOC needs.  15:35- Spoke with daughter who requested EMS transport. EMS paperwork completed. [facility] EMS nonemergent transport arranged. Patient is fourth on the list. Notified RN Amandeep.  Expected Discharge Plan: Assisted Living Barriers to Discharge: Continued Medical Work up   Patient  Goals and CMS Choice Patient states their goals for this hospitalization and ongoing recovery are:: return to [email or web address].gov Compare Post Acute Care list provided to:: Patient Choice offered to / list presented to : Patient  Expected Discharge Plan and Services Expected Discharge Plan: Assisted Living                                              Prior Living Arrangements/Services   Lives with:: Facility Resident Patient language and need for interpreter reviewed:: Yes Do you feel safe going back to the place where you live?: Yes      Need for Family Participation in Patient Care: Yes (Comment) Care giver support system in place?: Yes (comment) Current home services: DME Criminal Activity/Legal Involvement Pertinent to Current Situation/Hospitalization: No - Comment as needed  Activities of Daily Living Home Assistive Devices/Equipment: None ADL Screening (condition at time of admission) Patient's cognitive ability adequate to safely complete daily activities?: No Is the patient deaf or have difficulty hearing?: Yes Does the patient have difficulty seeing, even when wearing glasses/contacts?: No Does the patient have difficulty concentrating, remembering, or making decisions?: Yes Patient able to express need for assistance with ADLs?: Yes Does the patient have difficulty dressing or bathing?: Yes Independently performs ADLs?: No Does the patient have difficulty walking or climbing stairs?: Yes Weakness of  Legs: Both Weakness of Arms/Hands: None  Permission Sought/Granted Permission sought to share information with : Facility Art therapist granted to share information with : Yes, Verbal Permission Granted     Permission granted to share info w AGENCY: [place]        Emotional Assessment       Orientation: : Oriented to Self, Oriented to Place, Oriented to  Time, Oriented to Situation Alcohol / Substance Use: Not  Applicable Psych Involvement: No (comment)  Admission diagnosis:  UTI (urinary tract infection) [N39.0] Acute UTI [N39.0] Sepsis ([place]) [A41.9] COVID-19 [U07.1] Patient Active Problem List   Diagnosis Date Noted   UTI (urinary tract infection) 09/04/2020   COVID-19 virus infection 09/04/2020   Dementia without behavioral disturbance ([place]) 12/27/2019   Ataxia 07/24/2019   Confusion 07/24/2019   Sepsis ([place]) 05/22/2019   Sepsis secondary to UTI ([place]) 05/22/2019   Hypotension 05/22/2019   Acute kidney injury superimposed on CKD ([place]) 05/22/2019   Compression fracture of body of thoracic vertebra ([place]) 05/04/2019   Compression fracture of C-spine ([place]) 05/04/2019   Fall 05/03/2019   Anxiety, generalized 03/19/2019   Exertional shortness of breath 10/03/2018   Heart palpitations 10/02/2018   Mild cognitive impairment 03/28/2018   Aortic atherosclerosis ([place]) 02/24/2018   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   Cystocele, midline 04/28/2017   Recurrent major depressive disorder, in partial remission ([place]) 03/17/2017   Neuropathy 03/01/2017   Lumbosacral radiculopathy 02/16/2017   Bilateral pain of leg and foot 12/16/2016   Generalized weakness 12/16/2016   Trochanteric bursitis of both hips 12/16/2016   Weakness of both legs 12/16/2016   Stress incontinence of urine 10/15/2014   Polypharmacy 09/02/2014   Primary cancer of right upper lobe of lung ([place]) 07/19/2014   Acid reflux 07/18/2014   Adaptive colitis 07/18/2014   Osteoporosis, 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 [place] [address] Phone: [phone] Fax: [place], [place] [facility] [address] AT [place] [facility] [place] [address] Phone: [phone] Fax: [phone]     Social Determinants of Health (SDOH) Interventions    Readmission Risk Interventions No flowsheet data found.

## 2020-09-05 NOTE — Evaluation (Addendum)
Physical Therapy Evaluation Patient Details Name: Christy [name] MRN: [id number] DOB: September 14, 1940 Today's Date: 09/05/2020   History of Present Illness  Christy [name] is a 80 y.o. female with medical history significant for HTN, mild dementia, chronic low back pain, depression, stress incontinence, hospitalized for Serratia UTI from 6/4-6/8 who was sent in by facility with a fever of 101, tachycardia 120 and strong odor to urine.  Patient also reports waking up with a cough and sore throat but denies shortness of breath or chest pain.  Denies abdominal pain or diarrhea. Patient is Covid +.   Clinical Impression  Patient received in bed, reports she is not feeling good. Patient is reluctant to participate but with encouragement, she is agreeable to sitting up on the side of the bed. Patient requires mod assist for supine to sit. Mainly to bring LEs on and off bed. Patient is able to sit unsupported with supervision once at edge of bed. Patient has decreased knee extension bilaterally.  She will continue to benefit from skilled PT while here to improve functional mobility.     Follow Up Recommendations Other (comment);Home health PT (ALF with HH/OPPT)    Equipment Recommendations  None recommended by PT    Recommendations for Other Services       Precautions / Restrictions Precautions Precautions: Fall Restrictions Weight Bearing Restrictions: No      Mobility  Bed Mobility Overal bed mobility: Needs Assistance Bed Mobility: Supine to Sit;Sit to Supine     Supine to sit: Min assist Sit to supine: Min assist   General bed mobility comments: Required assist to bring legs into and out of bed. Assist for positioning in bed    Transfers                 General transfer comment: patient declines this session due to not feeling well  Ambulation/Gait             General Gait Details: not attempted  Stairs            Wheelchair Mobility     Modified Rankin (Stroke Patients Only)       Balance Overall balance assessment: Needs assistance Sitting-balance support: Feet supported Sitting balance-Leahy Scale: Fair Sitting balance - Comments: She is able to sit unsupported at edge of bed       Standing balance comment: not assessed                             Pertinent Vitals/Pain Pain Assessment: No/denies pain    Home Living Family/patient expects to be discharged to:: Assisted living Living Arrangements: Alone             Home Equipment: Gilford Rile - 2 wheels;Shower seat - built in;Grab bars - toilet;Wheelchair - manual      Prior Function Level of Independence: Needs assistance   Gait / Transfers Assistance Needed: SBA with ambulation with a RW in her ALF room, able to ambulate down hall of ALF with PT but used a w/c for facility mobility with staff. Patient reports 2 falls  ADL's / Homemaking Assistance Needed: Assist from ALF staff with all ADLs, meals, meds, etc..        Hand Dominance   Dominant Hand: Right    Extremity/Trunk Assessment   Upper Extremity Assessment Upper Extremity Assessment: Generalized weakness    Lower Extremity Assessment Lower Extremity Assessment: Generalized weakness       Communication  Communication: No difficulties  Cognition Arousal/Alertness: Awake/alert Behavior During Therapy: WFL for tasks assessed/performed Overall Cognitive Status: Within Functional Limits for tasks assessed                                        General Comments      Exercises     Assessment/Plan    PT Assessment Patient needs continued PT services  PT Problem List Decreased strength;Decreased mobility;Decreased activity tolerance;Decreased balance;Decreased range of motion       PT Treatment Interventions DME instruction;Therapeutic exercise;Functional mobility training;Therapeutic activities;Patient/family education;Gait training;Balance  training    PT Goals (Current goals can be found in the Care Plan section)  Acute Rehab PT Goals Patient Stated Goal: return to [facility] PT Goal Formulation: With patient Time For Goal Achievement: 09/12/20 Potential to Achieve Goals: Good    Frequency Min 2X/week   Barriers to discharge        Co-evaluation               AM-PAC PT "6 Clicks" Mobility  Outcome Measure Help needed turning from your back to your side while in a flat bed without using bedrails?: A Little Help needed moving from lying on your back to sitting on the side of a flat bed without using bedrails?: A Lot Help needed moving to and from a bed to a chair (including a wheelchair)?: A Lot Help needed standing up from a chair using your arms (e.g., wheelchair or bedside chair)?: A Lot Help needed to walk in hospital room?: A Lot Help needed climbing 3-5 steps with a railing? : Total 6 Click Score: 12    End of Session   Activity Tolerance: Other (comment) (patient reports she is generally not feeling well) Patient left: in bed;with bed alarm set;with call bell/phone within reach Nurse Communication: Mobility status PT Visit Diagnosis: Other abnormalities of gait and mobility (R26.89);Muscle weakness (generalized) (M62.81);Difficulty in walking, not elsewhere classified (R26.2)    Time: 1310-1330 PT Time Calculation (min) (ACUTE ONLY): 20 min   Charges:   PT Evaluation $PT Eval Moderate Complexity: 1 Mod PT Treatments $Therapeutic Activity: 8-22 mins        Christy [name], PT, GCS 09/05/20,1:53 PM

## 2020-09-05 NOTE — Discharge Summary (Deleted)
Discharge Summary  Patient ID: Christy [name] MRN: [id number] DOB/AGE: Jun 16, 1940 80 y.o.  Admit date: 09/04/2020 Discharge date: 09/05/2020  Admission Diagnoses:  Discharge Diagnoses:  Active Problems:   Benign essential HTN   Generalized weakness   UTI (urinary tract infection)   COVID-19 virus infection   Discharged Condition: good  Hospital Course:  Christy [name] is a 80 y.o. female with medical history significant for HTN, mild dementia, chronic low back pain, depression, stress incontinence, hospitalized for Serratia UTI from 6/4-6/8 who was sent in by facility with a fever of 101, tachycardia 120 and strong odor to urine. Patient was positive for COVID, she also has abnormal urine suggest UTI.  Treated with Rocephin.  #1.  UTI secondary to Enterococcus Faecalis Patient does not meet sepsis criteria, sepsis is ruled out. Patient blood cultures so far had no growth, urine culture growing Enterococcus. Patient condition seem to be improving, I will treat her with 1 dose of fosfomycin before discharge.  2.  COVID-19 infection. Patient does not have hypoxemia.  X-ray did not show pneumonia. He received 2 doses of remdesivir.  No additional treatment is needed as patient does not have any hypoxemia or pneumonia.  3.  Essential hypertension. Continue home medicine   4.  Dementia.  #5.  Prognosis. System has identified the patient has a high risk of 12 months mortality.  Please refer to palliative care as outpatient.  Consults: None  Significant Diagnostic Studies:     Treatments: antibiotics  Discharge Exam: Blood pressure (!) 155/70, pulse 72, temperature 98.5 F (36.9 C), resp. rate 16, height 5\' 5"  (1.651 m), weight 65 kg, SpO2 96 %. General appearance: alert and cooperative Resp: clear to auscultation bilaterally Cardio: regular rate and rhythm, S1, S2 normal, no murmur, click, rub or gallop GI: soft, non-tender; bowel sounds normal; no masses,  no  organomegaly Extremities: extremities normal, atraumatic, no cyanosis or edema  Disposition: Discharge disposition: 01-Home or Self Care       Discharge Instructions     Diet - low sodium heart healthy   Complete by: As directed    Increase activity slowly   Complete by: As directed       Allergies as of 09/05/2020       Reactions   Alendronate Other (See Comments)   Esophageal burning   Nitrofurantoin Nausea Only, Nausea And Vomiting   Sulfa Antibiotics Other (See Comments)   Cefdinir    Ciprofloxacin Other (See Comments)   Other Reaction: GI UPSET   Diphenhydramine Other (See Comments)   Lactose Intolerance (gi)         Medication List     TAKE these medications    acetaminophen 500 MG tablet Commonly known as: TYLENOL Take 500 mg by mouth every 6 (six) hours as needed for mild pain.   Acidophilus Chew Chew 1 tablet by mouth 2 (two) times daily.   azelastine 0.1 % nasal spray Commonly known as: ASTELIN Place 2 sprays into both nostrils 2 (two) times daily as needed for rhinitis. Use in each nostril as directed   benazepril 5 MG tablet Commonly known as: LOTENSIN Take 1 tablet by mouth daily.   cholecalciferol 25 MCG (1000 UNIT) tablet Commonly known as: VITAMIN D3 Take 1,000 Units by mouth daily.   diclofenac Sodium 1 % Gel Commonly known as: VOLTAREN Apply 1 application topically daily as needed.   donepezil 5 MG tablet Commonly known as: ARICEPT Take 5 mg by mouth at bedtime.  DULoxetine 20 MG capsule Commonly known as: CYMBALTA Take 20 mg by mouth at bedtime.   estradiol 1 MG tablet Commonly known as: ESTRACE Take 1 mg by mouth daily.   nystatin 100000 UNIT/ML suspension Commonly known as: MYCOSTATIN Take 5 mLs (500,000 Units total) by mouth 4 (four) times daily.   OCUVITE ADULT 50+ PO Take 1 tablet by mouth daily.   multivitamin with minerals tablet Take 1 tablet by mouth daily.   omeprazole 40 MG capsule Commonly known as:  PRILOSEC Take 40 mg by mouth daily.   polyethylene glycol 17 g packet Commonly known as: MIRALAX / GLYCOLAX Take 17 g by mouth daily as needed for moderate constipation.   vitamin B-12 1000 MCG tablet Commonly known as: CYANOCOBALAMIN Take 1,000 mcg by mouth daily.        Follow-up Information     Christy Kayser, MD Follow up in 1 week(s).   Specialty: Internal Medicine Contact information: [place] [address] [phone]                 Signed: Sharen [name] 09/05/2020, 2:43 PM

## 2020-09-06 LAB — URINE CULTURE: Culture: >=100000 — AB

## 2020-09-08 ENCOUNTER — Ambulatory Visit: Payer: Medicare Other | Admitting: Obstetrics & Gynecology

## 2020-09-08 LAB — CULTURE, BLOOD (ROUTINE X 2)
Culture: NO GROWTH
Special Requests: ADEQUATE

## 2020-09-09 LAB — CULTURE, BLOOD (ROUTINE X 2): Culture: NO GROWTH

## 2020-10-18 ENCOUNTER — Emergency Department
Admission: EM | Admit: 2020-10-18 | Discharge: 2020-10-18 | Disposition: A | Discharge status: Home / Self Care | Payer: Medicare Other | Attending: Emergency Medicine | Admitting: Emergency Medicine

## 2020-10-18 ENCOUNTER — Emergency Department: Payer: Medicare Other

## 2020-10-18 DIAGNOSIS — R350 Frequency of micturition: Secondary | ICD-10-CM | POA: Insufficient documentation

## 2020-10-18 DIAGNOSIS — F0390 Unspecified dementia without behavioral disturbance: Secondary | ICD-10-CM | POA: Insufficient documentation

## 2020-10-18 DIAGNOSIS — I129 Hypertensive chronic kidney disease with stage 1 through stage 4 chronic kidney disease, or unspecified chronic kidney disease: Secondary | ICD-10-CM | POA: Insufficient documentation

## 2020-10-18 DIAGNOSIS — R319 Hematuria, unspecified: Secondary | ICD-10-CM | POA: Insufficient documentation

## 2020-10-18 DIAGNOSIS — R1030 Lower abdominal pain, unspecified: Secondary | ICD-10-CM | POA: Insufficient documentation

## 2020-10-18 DIAGNOSIS — Z87891 Personal history of nicotine dependence: Secondary | ICD-10-CM | POA: Insufficient documentation

## 2020-10-18 DIAGNOSIS — Z79899 Other long term (current) drug therapy: Secondary | ICD-10-CM | POA: Insufficient documentation

## 2020-10-18 DIAGNOSIS — Z8616 Personal history of COVID-19: Secondary | ICD-10-CM | POA: Diagnosis not present

## 2020-10-18 DIAGNOSIS — Z85118 Personal history of other malignant neoplasm of bronchus and lung: Secondary | ICD-10-CM | POA: Insufficient documentation

## 2020-10-18 DIAGNOSIS — N183 Chronic kidney disease, stage 3 unspecified: Secondary | ICD-10-CM | POA: Diagnosis not present

## 2020-10-18 DIAGNOSIS — Z87448 Personal history of other diseases of urinary system: Secondary | ICD-10-CM | POA: Diagnosis not present

## 2020-10-18 LAB — URINALYSIS, COMPLETE (UACMP) WITH MICROSCOPIC
Bacteria, UA: NONE SEEN
Bilirubin Urine: NEGATIVE
Glucose, UA: NEGATIVE mg/dL
Ketones, ur: NEGATIVE mg/dL
Nitrite: NEGATIVE
Protein, ur: NEGATIVE mg/dL
Specific Gravity, Urine: 1.015 (ref 1.005–1.030)
pH: 7 (ref 5.0–8.0)

## 2020-10-18 LAB — BASIC METABOLIC PANEL
Anion gap: 6 (ref 5–15)
BUN: 15 mg/dL (ref 8–23)
CO2: 30 mmol/L (ref 22–32)
Calcium: 8.9 mg/dL (ref 8.9–10.3)
Chloride: 104 mmol/L (ref 98–111)
Creatinine, Ser: 0.79 mg/dL (ref 0.44–1.00)
GFR, Estimated: >60 mL/min (ref >60)
Glucose, Bld: 105 mg/dL — ABNORMAL HIGH (ref 70–99)
Potassium: 4.2 mmol/L (ref 3.5–5.1)
Sodium: 140 mmol/L (ref 135–145)

## 2020-10-18 LAB — CBC
HCT: 37.1 % (ref 36.0–46.0)
Hemoglobin: 12.5 g/dL (ref 12.0–15.0)
MCH: 31.9 pg (ref 26.0–34.0)
MCHC: 33.7 g/dL (ref 30.0–36.0)
MCV: 94.6 fL (ref 80.0–100.0)
Platelets: 191 10*3/uL (ref 150–400)
RBC: 3.92 MIL/uL (ref 3.87–5.11)
RDW: 13.2 % (ref 11.5–15.5)
WBC: 6.2 10*3/uL (ref 4.0–10.5)
nRBC: 0 % (ref 0.0–0.2)

## 2020-10-18 NOTE — ED Notes (Signed)
[facility] to transport to [facility]

## 2020-10-18 NOTE — ED Notes (Signed)
Pt given a sandwich tray ?

## 2020-10-18 NOTE — Discharge Instructions (Addendum)
Please follow-up with primary care to check on the hematuria.  There is no blood in the urine here in the emergency department.  I would also have GYN check on the pessary at some point fairly soon to make sure there is no bleeding under the pessary.  I did not see any blood in the vagina when I checked now.  Additionally there is no blood in the stool.  Please return for any fever or abdominal discomfort or heavy bleeding in the urine.  If GYN does not find anything and she does not have a UTI  she should see urology for further hematuria.  Also patient's blood pressure is quite high in the emergency department.  If it remains high for the next day or 2 it probably would be useful to increase her Lotensin to 10 mg a day or possibly 5 mg twice a day if it is lower in the morning and higher in the evening.  I would take the blood pressure at least twice a day once in the morning and once in the evening.  It would be even better to take it 3 times a day and add an afternoon reading.  Do that for 2 to 3 days and call her doctor and let him look at the blood pressure readings and see if she needs to have her dosage increased.  Her blood pressure may just be high here because of whitecoat hypertension.

## 2020-10-18 NOTE — ED Triage Notes (Addendum)
Per EMS, Pt, from [facility], c/o urinary frequency x "a while" and hematuria starting today.  Pt reports intermittent abdominal spasms x "a long time" increasing last night.  Denies pain.     Pt reports frequency started after pessary was removed.

## 2020-10-18 NOTE — ED Notes (Signed)
Pts daughter was updated to discharge instructions, plan of care. Daughter states patient does not have legal guardian but she is POA.

## 2020-10-18 NOTE — ED Notes (Signed)
Report given to Courtney RN.

## 2020-10-18 NOTE — ED Provider Notes (Signed)
[facility] Emergency Department Provider Note  ____________________________________________   Event Date/Time   First MD Initiated Contact with Patient 10/18/20 1625     (approximate)  I have reviewed the triage vital signs and the nursing notes.   HISTORY  Chief Complaint Hematuria and Urinary Frequency  HPI Tiffanie Blassingame is a 80 y.o. female who comes from [facility] assisted living complaining of urinary frequency for a while.  Its been a couple days.  She has had hematuria today.  Some lower abdominal spasms also.  She told the charge nurse the frequency started after pessary was removed.  She told me she still has a pessary.  She has no fever or vomiting.  Pain is not severe.        Past Medical History:  Diagnosis Date   Allergy    Anxiety    Depression    Fatty liver disease, nonalcoholic    GERD (gastroesophageal reflux disease)    Heart disease    Hypertension    Lung cancer ([place]) 02/2014   RUL Lobectomy   Urinary incontinence     Patient Active Problem List   Diagnosis Date Noted   UTI (urinary tract infection) 09/04/2020   COVID-19 virus infection 09/04/2020   Dementia without behavioral disturbance ([place]) 12/27/2019   Ataxia 07/24/2019   Confusion 07/24/2019   Sepsis ([place]) 05/22/2019   Sepsis secondary to UTI ([place]) 05/22/2019   Hypotension 05/22/2019   Acute kidney injury superimposed on CKD ([place]) 05/22/2019   Compression fracture of body of thoracic vertebra ([place]) 05/04/2019   Compression fracture of C-spine (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   Cystocele, midline 04/28/2017   Recurrent major depressive disorder, in partial remission ([place]) 03/17/2017   Neuropathy 03/01/2017   Lumbosacral  radiculopathy 02/16/2017   Bilateral pain of leg and foot 12/16/2016   Generalized weakness 12/16/2016   Trochanteric bursitis of both hips 12/16/2016   Weakness of both legs 12/16/2016   Stress incontinence of urine 10/15/2014   Polypharmacy 09/02/2014   Primary cancer of right upper lobe of lung ([place]) 07/19/2014   Acid reflux 07/18/2014   Adaptive colitis 07/18/2014   Osteoporosis, 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   Chronic kidney disease (CKD), stage III (moderate) ([place]) 08/30/2013   Benign essential HTN 08/30/2013   Fatty liver disease, nonalcoholic 01/75/1025   Fibrositis 08/30/2013   Hot flash, menopausal 08/30/2013   Billowing mitral valve 08/30/2013   Allergic rhinitis, seasonal 08/30/2013   Fibromyalgia 08/30/2013    Past Surgical History:  Procedure Laterality Date   ABDOMINAL HYSTERECTOMY     KYPHOPLASTY N/A 05/04/2019   Procedure: KYPHOPLASTY T7;  Surgeon: Hessie Knows, MD;  Location: [facility] ORS;  Service: Orthopedics;  Laterality: N/A;   LAPAROSCOPIC HYSTERECTOMY  1977   LARYNGOSCOPY N/A 07/22/2014   Procedure: JET LARYNGOSCOPY with right vocal cord biopsy;  Surgeon: Margaretha Sheffield, MD;  Location: [facility] ORS;  Service: ENT;  Laterality: N/A;   LUNG CANCER SURGERY     NECK SURGERY      Prior to Admission medications   Medication Sig Start Date End Date Taking? Authorizing Provider  acetaminophen (TYLENOL) 500 MG tablet Take 500 mg by mouth every 6 (six) hours as needed for mild pain.    [provider]  acetaminophen (TYLENOL) 500 MG tablet Take  1 tablet (500 mg total) by mouth at bedtime. 09/05/20 09/05/21  Sharen Hones, MD  azelastine (ASTELIN) 0.1 % nasal spray Place 2 sprays into both nostrils 2 (two) times daily as needed for rhinitis. Use in each nostril as directed    [provider]  benazepril (LOTENSIN) 5 MG tablet Take 1 tablet by mouth daily. 03/04/14   [provider]  cholecalciferol (VITAMIN D3) 25 MCG (1000 UNIT) tablet Take 1,000 Units by  mouth daily.    [provider]  diclofenac Sodium (VOLTAREN) 1 % GEL Apply 1 application topically daily. 09/05/20   Sharen Hones, MD  donepezil (ARICEPT) 5 MG tablet Take 5 mg by mouth at bedtime.    [provider]  DULoxetine (CYMBALTA) 20 MG capsule Take 20 mg by mouth at bedtime. 07/17/20   [provider]  estradiol (ESTRACE) 1 MG tablet Take 1 mg by mouth daily.  04/29/17   [provider]  nystatin (MYCOSTATIN) 100000 UNIT/ML suspension Take 5 mLs (500,000 Units total) by mouth 4 (four) times daily. 07/16/20   Mercy Riding, MD  omeprazole (PRILOSEC) 40 MG capsule Take 40 mg by mouth daily.     [provider]  polyethylene glycol (MIRALAX / GLYCOLAX) 17 g packet Take 17 g by mouth daily as needed for moderate constipation.    [provider]  Probiotic Product (ACIDOPHILUS) CHEW Chew 1 tablet by mouth 2 (two) times daily. 07/17/20   [provider]  vitamin B-12 (CYANOCOBALAMIN) 1000 MCG tablet Take 1,000 mcg by mouth daily.     [provider]  nortriptyline (PAMELOR) 10 MG capsule Take 20 mg by mouth at bedtime.  03/21/19 02/04/20  [provider]    Allergies Alendronate, Nitrofurantoin, Sulfa antibiotics, Cefdinir, Ciprofloxacin, Diphenhydramine, and Lactose intolerance (gi)  Family History  Problem Relation Age of Onset   Kidney cancer Cousin    Cancer Maternal Grandfather    Cancer Maternal Grandmother    Kidney disease Neg Hx    Prostate cancer Neg Hx     Social History Social History   Tobacco Use   Smoking status: Former    Packs/day: 0.50    Years: 25.00    Pack years: 12.50    Types: Cigarettes    Quit date: 07/18/2006    Years since quitting: 14.2   Smokeless tobacco: Never  Vaping Use   Vaping Use: Never used  Substance Use Topics   Alcohol use: Yes    Alcohol/week: 2.0 standard drinks    Types: 1 Glasses of wine, 1 Cans of beer per week    Comment: occassionally about 4 times a  year   Drug use: No    Review of Systems  Constitutional: No fever/chills Eyes: No visual changes. ENT: No sore throat. Cardiovascular: Denies chest pain. Respiratory: Denies shortness of breath. Gastrointestinal: Lower abdominal pain.  No nausea, no vomiting.  No diarrhea.  No constipation. Genitourinarydysuria. Musculoskeletal: Negative for back pain. Skin: Negative for rash. Neurological: Negative for headaches, focal weakness   ____________________________________________   PHYSICAL EXAM:  VITAL SIGNS: ED Triage Vitals  Enc Vitals Group     BP 10/18/20 1624 (!) 153/71     Pulse Rate 10/18/20 1624 66     Resp 10/18/20 1624 16     Temp 10/18/20 1624 98 F (36.7 C)     Temp Source 10/18/20 1624 Oral     SpO2 10/18/20 1621 100 %     Weight --  Height --      Head Circumference --      Peak Flow --      Pain Score 10/18/20 1626 0     Pain Loc --      Pain Edu? --      Excl. in [place]? --     Constitutional: Alert and oriented. Well appearing and in no acute distress. Eyes: Conjunctivae are normal. PER Head: Atraumatic. Nose: No congestion/rhinnorhea. Mouth/Throat: Mucous membranes are moist.  Oropharynx non-erythematous. Neck: No stridor.  Cardiovascular: Normal rate, regular rhythm. Grossly normal heart sounds.  Good peripheral circulation. Respiratory: Normal respiratory effort.  No retractions. Lungs CTAB. Gastrointestinal: Soft and nontender except for over the suprapubic area where there is some tenderness to palpation. No distention. No abdominal bruits.  Musculoskeletal: No lower extremity tenderness nor edema.   Neurologic:  Normal speech and language. No gross focal neurologic deficits are appreciated.  Skin:  Skin is warm, dry and intact. No rash noted. ood and affect are normal. Speech and behavior are normal.  ____________________________________________   LABS (all labs ordered are listed, but only abnormal results are displayed)  Labs Reviewed   URINALYSIS, COMPLETE (UACMP) WITH MICROSCOPIC - Abnormal; Notable for the following components:      Result Value   Hgb urine dipstick TRACE (*)    Leukocytes,Ua SMALL (*)    All other components within normal limits  BASIC METABOLIC PANEL - Abnormal; Notable for the following components:   Glucose, Bld 105 (*)    All other components within normal limits  CBC   ____________________________________________  EKG   ____________________________________________  RADIOLOGY Gertha Calkin, personally viewed and evaluated these images (plain radiographs) as part of my medical decision making, as well as reviewing the written report by the radiologist.  ED MD interpretation: CT read by radiology reviewed by me does not show any acute pathology  Official radiology report(s): CT Renal Stone Study  Result Date: 10/18/2020 CLINICAL DATA:  Chronic intermittent abdominal spasms, increasing last night. Chronic urinary frequency. Hematuria today. EXAM: CT ABDOMEN AND PELVIS WITHOUT CONTRAST TECHNIQUE: Multidetector CT imaging of the abdomen and pelvis was performed following the standard protocol without IV contrast. COMPARISON:  03/24/2020 FINDINGS: Lower chest: Stable enlarged left atrium. Atheromatous calcifications, including the coronary arteries and aorta. Bilateral retropectoral breast implants. Stable mild nodularity in the right lower lobe with an appearance most compatible with chronic scarring. Hepatobiliary: No focal liver abnormality is seen. No gallstones, gallbladder wall thickening, or biliary dilatation. Pancreas: Unremarkable. No pancreatic ductal dilatation or surrounding inflammatory changes. Spleen: Normal in size without focal abnormality. Adrenals/Urinary Tract: Adrenal glands are unremarkable. Kidneys are normal, without renal calculi, focal lesion, or hydronephrosis. Bladder is unremarkable. Stomach/Bowel: Stomach is within normal limits. Appendix appears normal. No evidence  of bowel wall thickening, distention, or inflammatory changes. Vascular/Lymphatic: Atheromatous arterial calcifications without aneurysm. No enlarged lymph nodes. Reproductive: Status post hysterectomy. No adnexal masses. A pessary remains in place. Other: Small supraumbilical hernia containing fat. Musculoskeletal: Unremarkable bones. IMPRESSION: No acute abnormality. Electronically Signed   By: Claudie Revering M.D.   On: 10/18/2020 18:18    ____________________________________________   PROCEDURES  Procedure(s) performed (including Critical Care):  Procedures   ____________________________________________   INITIAL IMPRESSION / ASSESSMENT AND PLAN / ED COURSE  ----------------------------------------- 7:31 PM on 10/18/2020 ----------------------------------------- On reexam patient's abdominal pain is gone.  Patient has no hemorrhoids on rectal stool Hemoccult negative there are no masses.  GYN exam shows normal perineum normal vagina  pessary is in place no bleeding currently. After her pelvic exam her blood pressure was fairly high this may be due to stress or embarrassment.  We will watch her for a little bit see if it comes down.    ----------------------------------------- 7:54 PM on 10/18/2020 ----------------------------------------- Blood pressure slightly lower.  It is running in the 180s during most of her visit here.  This may be because of whitecoat hypertension or possibly she needs her Benzepril increased.  I have put a note in the discharge instructions to have her doctor further evaluate her blood pressure and maintain a blood pressure record for several days and for her doctor to be able to look at.          ____________________________________________   FINAL CLINICAL IMPRESSION(S) / ED DIAGNOSES  Final diagnoses:  History of hematuria     ED Discharge Orders     None        Note:  This document was prepared using Dragon voice recognition software  and may include unintentional dictation errors.    Nena Polio, MD 10/18/20 Karl [place]

## 2020-10-23 NOTE — Telephone Encounter (Signed)
Patient is scheduled for 9//16/22 with [facility]

## 2020-10-23 NOTE — Telephone Encounter (Signed)
Please schedule

## 2020-10-24 ENCOUNTER — Ambulatory Visit (INDEPENDENT_AMBULATORY_CARE_PROVIDER_SITE_OTHER): Payer: Medicare Other | Admitting: Obstetrics & Gynecology

## 2020-10-24 ENCOUNTER — Other Ambulatory Visit: Payer: Self-pay

## 2020-10-24 ENCOUNTER — Encounter: Payer: Self-pay | Admitting: Obstetrics & Gynecology

## 2020-10-24 VITALS — Wt 138.0 lb

## 2020-10-24 DIAGNOSIS — N393 Stress incontinence (female) (male): Secondary | ICD-10-CM | POA: Diagnosis not present

## 2020-10-24 DIAGNOSIS — N8111 Cystocele, midline: Secondary | ICD-10-CM | POA: Diagnosis not present

## 2020-10-24 DIAGNOSIS — R350 Frequency of micturition: Secondary | ICD-10-CM

## 2020-10-24 NOTE — Progress Notes (Signed)
HPI:      [name] is a 80 y.o.  who presents today for her pessary follow up and examination related to her pelvic floor weakening.  She reports some recent episodes of spotting.  She has incontinence and freq, feels it is better w pessary but still has it.  She currently has a incontinence ring #5 pessary.  PMHx: She  has a past medical history of Allergy, Anxiety, Depression, Fatty liver disease, nonalcoholic, GERD (gastroesophageal reflux disease), Heart disease, Hypertension, Lung cancer ([place]) (02/2014), and Urinary incontinence. Also,  has a past surgical history that includes Laryngoscopy (N/A, 07/22/2014); Laparoscopic hysterectomy (1977); Lung cancer surgery; Abdominal hysterectomy; Kyphoplasty (N/A, 05/04/2019); and Neck surgery., family history includes Cancer in her maternal grandfather and maternal grandmother; Kidney cancer in her cousin.,  reports that she quit smoking about 14 years ago. Her smoking use included cigarettes. She has a 12.50 pack-year smoking history. She has never used smokeless tobacco. She reports current alcohol use of about 2.0 standard drinks per week. She reports that she does not use drugs.  She has a current medication list which includes the following prescription(s): acetaminophen, acetaminophen, azelastine, benazepril, cholecalciferol, diclofenac sodium, donepezil, duloxetine, estradiol, nystatin, omeprazole, polyethylene glycol, acidophilus, vitamin b-12, and [DISCONTINUED] nortriptyline. Also, is allergic to alendronate, nitrofurantoin, sulfa antibiotics, cefdinir, ciprofloxacin, diphenhydramine, and lactose intolerance (gi).  Review of Systems  All other systems reviewed and are negative.  Objective: Wt 138 lb (62.6 kg)   BMI 22.96 kg/m  Physical Exam Constitutional:      General: She is not in acute distress.    Appearance: She is well-developed.  Genitourinary:     Bladder and urethral meatus normal.     Genitourinary Comments: Cuff  intact/ no lesions  Absent uterus and cervix     Vaginal cuff intact.    Vaginal bleeding present.     No vaginal erythema.     Vaginal exam comments: Small area of bleeding at apex; not ulcerated .      Right Adnexa: not tender and no mass present.    Left Adnexa: not tender and no mass present.    Cervix is absent.     Uterus is absent.     Pelvic exam was performed with patient in the lithotomy position.  HENT:     Head: Normocephalic and atraumatic.     Nose: Nose normal.  Abdominal:     General: There is no distension.     Palpations: Abdomen is soft.     Tenderness: There is no abdominal tenderness.  Musculoskeletal:        General: Normal range of motion.  Neurological:     Mental Status: She is alert and oriented to person, place, and time.     Cranial Nerves: No cranial nerve deficit.  Skin:    General: Skin is warm and dry.  Psychiatric:        Attention and Perception: Attention normal.        Mood and Affect: Mood normal.        Speech: Speech normal.        Behavior: Behavior normal.        Cognition and Memory: Cognition normal.        Judgment: Judgment normal.    Pessary Care Pessary removed and cleaned.  Vagina checked - without erosions - pessary replaced.  A/P:   ICD-10-CM   1. Cystocele, midline  N81.11     2. Stress incontinence of urine  N39.3     3. Urinary frequency  R35.0      Pessary holiday advised.  Will leave out and follow symptoms closely.  Replace in 4 weeks.  A total of 20 minutes were spent face-to-face with the patient as well as preparation, review, communication, and documentation during this encounter.   Barnett Applebaum, MD, [facility], [facility] 10/24/2020  12:06 PM

## 2020-11-24 ENCOUNTER — Ambulatory Visit: Payer: Medicare Other | Admitting: Obstetrics & Gynecology

## 2020-11-26 ENCOUNTER — Ambulatory Visit: Payer: Medicare Other | Admitting: Obstetrics & Gynecology

## 2020-12-01 ENCOUNTER — Other Ambulatory Visit: Payer: Self-pay

## 2020-12-01 ENCOUNTER — Ambulatory Visit (INDEPENDENT_AMBULATORY_CARE_PROVIDER_SITE_OTHER): Payer: Medicare Other | Admitting: Obstetrics & Gynecology

## 2020-12-01 ENCOUNTER — Encounter: Payer: Self-pay | Admitting: Obstetrics & Gynecology

## 2020-12-01 VITALS — BP 130/80

## 2020-12-01 DIAGNOSIS — N8111 Cystocele, midline: Secondary | ICD-10-CM

## 2020-12-01 DIAGNOSIS — N393 Stress incontinence (female) (male): Secondary | ICD-10-CM

## 2020-12-01 DIAGNOSIS — R350 Frequency of micturition: Secondary | ICD-10-CM

## 2020-12-01 NOTE — Progress Notes (Signed)
HPI:      Christy [name] is a 80 y.o.  who presents today for her pessary follow up and examination related to her pelvic floor weakening.  Pessary has been out for the last 4 weeks due to prior vag bleeding. There has been no vaginal bleeding and no vaginal discharge since that time.  Symptoms of pelvic floor weakening have greatly worsened without the pessary in place (especially the incontinence).   PMHx: She  has a past medical history of Allergy, Anxiety, Depression, Fatty liver disease, nonalcoholic, GERD (gastroesophageal reflux disease), Heart disease, Hypertension, Lung cancer ([place]) (02/2014), and Urinary incontinence. Also,  has a past surgical history that includes Laryngoscopy (N/A, 07/22/2014); Laparoscopic hysterectomy (1977); Lung cancer surgery; Abdominal hysterectomy; Kyphoplasty (N/A, 05/04/2019); and Neck surgery., family history includes Cancer in her maternal grandfather and maternal grandmother; Kidney cancer in her cousin.,  reports that she quit smoking about 14 years ago. Her smoking use included cigarettes. She has a 12.50 pack-year smoking history. She has never used smokeless tobacco. She reports current alcohol use of about 2.0 standard drinks per week. She reports that she does not use drugs.  She has a current medication list which includes the following prescription(s): acetaminophen, acetaminophen, azelastine, benazepril, cholecalciferol, diclofenac sodium, donepezil, duloxetine, estradiol, nystatin, omeprazole, polyethylene glycol, acidophilus, vitamin b-12, and [DISCONTINUED] nortriptyline. Also, is allergic to alendronate, nitrofurantoin, sulfa antibiotics, cefdinir, ciprofloxacin, diphenhydramine, and lactose intolerance (gi).  Review of Systems  All other systems reviewed and are negative.  Objective: BP 130/80  Physical Exam Constitutional:      General: She is not in acute distress.    Appearance: She is well-developed.  Genitourinary:     Bladder  and urethral meatus normal.     Genitourinary Comments: Cuff intact/ no lesions  Absent uterus and cervix     Vaginal cuff intact.    No vaginal erythema or bleeding.     Anterior, posterior and apical vaginal prolapse present.    Mild vaginal atrophy present.     Right Adnexa: not tender and no mass present.    Left Adnexa: not tender and no mass present.    Cervix is absent.     Uterus is absent.     Pelvic exam was performed with patient in the lithotomy position.  HENT:     Head: Normocephalic and atraumatic.     Nose: Nose normal.  Abdominal:     General: There is no distension.     Palpations: Abdomen is soft.     Tenderness: There is no abdominal tenderness.  Musculoskeletal:        General: Normal range of motion.  Neurological:     Mental Status: She is alert and oriented to person, place, and time.     Cranial Nerves: No cranial nerve deficit.  Skin:    General: Skin is warm and dry.  Psychiatric:        Attention and Perception: Attention normal.        Mood and Affect: Mood normal.        Speech: Speech normal.        Behavior: Behavior normal.        Cognition and Memory: Cognition normal.        Judgment: Judgment normal.    Pessary Care Vagina checked - without erosions - pessary replaced.  A/P:1. Stress incontinence of urine 2. Cystocele, midline 3. Urinary frequency  Pessary (INCONT RING #5)  was cleaned and replaced today. Instructions given for  care. Concerning symptoms to observe for are counseled to patient. Follow up scheduled for 3 months.  A total of 20 minutes were spent face-to-face with the patient as well as preparation, review, communication, and documentation during this encounter.   Barnett Applebaum, MD, [facility], [facility] 12/01/2020  3:51 PM

## 2020-12-04 ENCOUNTER — Ambulatory Visit: Payer: Medicare Other | Admitting: Obstetrics & Gynecology

## 2021-03-29 ENCOUNTER — Other Ambulatory Visit: Payer: Self-pay

## 2021-03-29 ENCOUNTER — Encounter: Payer: Self-pay | Admitting: Emergency Medicine

## 2021-03-29 ENCOUNTER — Emergency Department: Payer: Medicare Other

## 2021-03-29 ENCOUNTER — Emergency Department
Admission: EM | Admit: 2021-03-29 | Discharge: 2021-03-29 | Disposition: A | Discharge status: Home / Self Care | Payer: Medicare Other | Attending: Emergency Medicine | Admitting: Emergency Medicine

## 2021-03-29 DIAGNOSIS — R410 Disorientation, unspecified: Secondary | ICD-10-CM | POA: Diagnosis present

## 2021-03-29 DIAGNOSIS — R42 Dizziness and giddiness: Secondary | ICD-10-CM

## 2021-03-29 DIAGNOSIS — I10 Essential (primary) hypertension: Secondary | ICD-10-CM

## 2021-03-29 DIAGNOSIS — R4182 Altered mental status, unspecified: Secondary | ICD-10-CM | POA: Diagnosis not present

## 2021-03-29 DIAGNOSIS — I129 Hypertensive chronic kidney disease with stage 1 through stage 4 chronic kidney disease, or unspecified chronic kidney disease: Secondary | ICD-10-CM | POA: Insufficient documentation

## 2021-03-29 DIAGNOSIS — K5900 Constipation, unspecified: Secondary | ICD-10-CM

## 2021-03-29 DIAGNOSIS — F0390 Unspecified dementia without behavioral disturbance: Secondary | ICD-10-CM | POA: Diagnosis not present

## 2021-03-29 DIAGNOSIS — M25552 Pain in left hip: Secondary | ICD-10-CM | POA: Insufficient documentation

## 2021-03-29 DIAGNOSIS — N189 Chronic kidney disease, unspecified: Secondary | ICD-10-CM | POA: Diagnosis not present

## 2021-03-29 DIAGNOSIS — Z8659 Personal history of other mental and behavioral disorders: Secondary | ICD-10-CM

## 2021-03-29 LAB — CBC WITH DIFFERENTIAL/PLATELET
Abs Immature Granulocytes: 0.04 10*3/uL (ref 0.00–0.07)
Basophils Absolute: 0.1 10*3/uL (ref 0.0–0.1)
Basophils Relative: 1 %
Eosinophils Absolute: 0.2 10*3/uL (ref 0.0–0.5)
Eosinophils Relative: 2 %
HCT: 39.2 % (ref 36.0–46.0)
Hemoglobin: 12.3 g/dL (ref 12.0–15.0)
Immature Granulocytes: 0 %
Lymphocytes Relative: 16 %
Lymphs Abs: 1.5 10*3/uL (ref 0.7–4.0)
MCH: 29.9 pg (ref 26.0–34.0)
MCHC: 31.4 g/dL (ref 30.0–36.0)
MCV: 95.4 fL (ref 80.0–100.0)
Monocytes Absolute: 0.8 10*3/uL (ref 0.1–1.0)
Monocytes Relative: 8 %
Neutro Abs: 6.7 10*3/uL (ref 1.7–7.7)
Neutrophils Relative %: 73 %
Platelets: 230 10*3/uL (ref 150–400)
RBC: 4.11 MIL/uL (ref 3.87–5.11)
RDW: 12.8 % (ref 11.5–15.5)
WBC: 9.3 10*3/uL (ref 4.0–10.5)
nRBC: 0 % (ref 0.0–0.2)

## 2021-03-29 LAB — URINALYSIS, COMPLETE (UACMP) WITH MICROSCOPIC
Bilirubin Urine: NEGATIVE
Glucose, UA: NEGATIVE mg/dL
Hgb urine dipstick: NEGATIVE
Ketones, ur: NEGATIVE mg/dL
Nitrite: NEGATIVE
Protein, ur: NEGATIVE mg/dL
Specific Gravity, Urine: 1.01 (ref 1.005–1.030)
pH: 7 (ref 5.0–8.0)

## 2021-03-29 LAB — COMPREHENSIVE METABOLIC PANEL
ALT: 17 U/L (ref 0–44)
AST: 21 U/L (ref 15–41)
Albumin: 3.5 g/dL (ref 3.5–5.0)
Alkaline Phosphatase: 70 U/L (ref 38–126)
Anion gap: 5 (ref 5–15)
BUN: 17 mg/dL (ref 8–23)
CO2: 30 mmol/L (ref 22–32)
Calcium: 8.8 mg/dL — ABNORMAL LOW (ref 8.9–10.3)
Chloride: 102 mmol/L (ref 98–111)
Creatinine, Ser: 0.9 mg/dL (ref 0.44–1.00)
GFR, Estimated: >60 mL/min (ref >60)
Glucose, Bld: 112 mg/dL — ABNORMAL HIGH (ref 70–99)
Potassium: 4.5 mmol/L (ref 3.5–5.1)
Sodium: 137 mmol/L (ref 135–145)
Total Bilirubin: 0.4 mg/dL (ref 0.3–1.2)
Total Protein: 7.1 g/dL (ref 6.5–8.1)

## 2021-03-29 LAB — T4, FREE: Free T4: 0.77 ng/dL (ref 0.61–1.12)

## 2021-03-29 LAB — LIPASE, BLOOD: Lipase: 38 U/L (ref 11–51)

## 2021-03-29 LAB — TSH: TSH: 5.62 u[IU]/mL — ABNORMAL HIGH (ref 0.350–4.500)

## 2021-03-29 MED ORDER — POLYETHYLENE GLYCOL 3350 17 GM/SCOOP PO POWD: 17.0000 g | Freq: Two times a day (BID) | ORAL | 0 refills | Status: DC | PRN

## 2021-03-29 MED ORDER — IOHEXOL 300 MG/ML  SOLN
100.0000 mL | Freq: Once | INTRAMUSCULAR | Status: AC | PRN
  Administered 2021-03-29: 100 mL via INTRAVENOUS

## 2021-03-29 MED ORDER — LACTATED RINGERS IV BOLUS
1000.0000 mL | Freq: Once | INTRAVENOUS | Status: AC
  Administered 2021-03-29: 1000 mL via INTRAVENOUS

## 2021-03-29 MED ORDER — FOSFOMYCIN TROMETHAMINE 3 G PO PACK
3.0000 g | PACK | Freq: Once | ORAL | Status: AC
  Administered 2021-03-29: 3 g via ORAL
  Filled 2021-03-29: qty 3

## 2021-03-29 NOTE — ED Provider Notes (Signed)
----------------------------------------- °  4:51 PM on 03/29/2021 ----------------------------------------- Patient's work-up is reassuring.  No significant findings on CT imaging.  Urine has resulted showing some bacteria and white cells we will dose a one-time dose of fosfomycin I have sent a urine culture.  Patient has had a bowel movement in the emergency department.  However given her constipation on CT I did recommend MiraLAX twice daily until she has multiple bowel movements and then decreasing to once daily or every other day.  Also discussed with the patient increasing her water intake at home as well.  Patient and daughter agreeable to plan of care we will discharge shortly   Harvest Dark, MD 03/29/21 [phone]

## 2021-03-29 NOTE — ED Notes (Signed)
Water and crackers provided.

## 2021-03-29 NOTE — ED Provider Notes (Signed)
[facility] Provider Note    Event Date/Time   First MD Initiated Contact with Patient 03/29/21 1133     (approximate)   History   Altered Mental Status   HPI  Christy [name] is a 81 y.o. female with a past medical history of osteoporosis, CKD, HTN, GERD, IBS, MVP, anxiety and remote C-spine injury, and dementia who presents for evaluation of some dizziness and confusion.  Patient does not oriented and history is somewhat difficult to obtain from patient.  She is complaining primarily of some left hip pain that seems to be ongoing for several months and possibly component of some abdominal pain although is unclear how long this has been going on.  She states she feels "drugged".  She denies any headache, earache, sore throat, vertigo but states her vision has been intermittently blurry over the last couple hours.  She denies any chest pain, cough, nausea or vomiting, diarrhea urinary symptoms or any other clear associated sick symptoms.  I was able to speak to her daughter who also spoke with the patient over the phone earlier today and wanted the patient evaluated with concerns that she may have received an extra dose of medication from another resident in a nursing facility as reported to her by staff facility.  She has otherwise been in her usual state of health and is not on any other sedation medications aside from possibly Cymbalta which has been on for many months.  No other recent falls or injuries or sick symptoms.      Physical Exam  Triage Vital Signs: ED Triage Vitals  Enc Vitals Group     BP 03/29/21 1114 113/66     Pulse Rate 03/29/21 1114 66     Resp 03/29/21 1114 16     Temp 03/29/21 1114 98.4 F (36.9 C)     Temp src --      SpO2 03/29/21 1114 99 %     Weight 03/29/21 1117 142 lb (64.4 kg)     Height 03/29/21 1117 5\' 5"  (1.651 m)     Head Circumference --      Peak Flow --      Pain Score --      Pain Loc --      Pain Edu? --       Excl. in [place]? --     Most recent vital signs: Vitals:   03/29/21 1400 03/29/21 1430  BP: (!) 168/84 (!) 164/83  Pulse: 62 61  Resp: 20 13  Temp:    SpO2: 99% 98%    General: Awake, no distress.  CV:  Good peripheral perfusion.  2+ radial and DP pulses. Resp:  Normal effort.  Clear bilaterally. Abd:  No distention.  Mild tenderness in left lower quadrant. Other:  Cranial nerves II through XII are grossly intact.  Patient is symmetric strength in her bilateral upper extremities and out the right lower extremity all history seems slightly weaker in the left hip possibly pain related that she is wincing on flexion extension.  Sensation is intact to light touch all extremities.  Patient is awake and alert but not oriented.  Visual acuity is 20/35 bilaterally.   ED Results / Procedures / Treatments  Labs (all labs ordered are listed, but only abnormal results are displayed) Labs Reviewed  COMPREHENSIVE METABOLIC PANEL - Abnormal; Notable for the following components:      Result Value   Glucose, Bld 112 (*)    Calcium 8.8 (*)  All other components within normal limits  TSH - Abnormal; Notable for the following components:   TSH 5.620 (*)    All other components within normal limits  CBC WITH DIFFERENTIAL/PLATELET  LIPASE, BLOOD  T4, FREE  URINALYSIS, COMPLETE (UACMP) WITH MICROSCOPIC     EKG  EKG is remarkable sinus rhythm with a ventricular rate of 62, normal axis, unremarkable intervals without evidence of acute ischemia or significant arrhythmia.   RADIOLOGY CT head without contrast on my interpretation shows no evidence of ischemia, mass effect, edema, hemorrhage or other acute intracranial process.  I also reviewed radiology interpretation and agree with their findings with some unchanged chronic small vessel ischemic disease and atrophy without any other clear acute process.  CT abdomen pelvis on my interpretation shows no evidence of diverticulitis, kidney stone,  left-sided perinephric stranding or acute injury to the hip.  As reviewed radiology capitation agree with the findings of slightly distended bladder with normal appendix and some evidence of constipation without evidence of an SBO or any other clear acute process.   PROCEDURES:  Critical Care performed: No  .1-3 Lead EKG Interpretation Performed by: Lucrezia Starch, MD Authorized by: Lucrezia Starch, MD     Interpretation: normal     ECG rate assessment: normal     Rhythm: sinus rhythm     Ectopy: none     Conduction: normal    {he patient is on the cardiac monitor to evaluate for evidence of arrhythmia and/or significant heart rate changes.   MEDICATIONS ORDERED IN ED: Medications  lactated ringers bolus 1,000 mL (0 mLs Intravenous Stopped 03/29/21 1506)  iohexol (OMNIPAQUE) 300 MG/ML solution 100 mL (100 mLs Intravenous Contrast Given 03/29/21 1315)     IMPRESSION / MDM / ASSESSMENT AND PLAN / ED COURSE  I reviewed the triage vital signs and the nursing notes.                              Differential diagnosis includes, but is not limited to possible oversedation from receiving an incorrect medication earlier this morning as reported by daughter who spoke with staff, metabolic derangement, arrhythmia, cystitis, possibly acute abdominal pathology it was unclear how long this has been going on patient is minimally tender in left lower quadrant.  EKG is remarkable sinus rhythm with a ventricular rate of 52, normal axis, unremarkable intervals without evidence of acute ischemia or significant arrhythmia.  CMP shows no significant electrolyte or metabolic derangements.  CBC shows no cytosis or acute anemia.  Lipase not consistent with acute pancreatitis.  TSH is slightly above upper limit of normal at 5.6 although free T4 is normal.  Low suspicion for acute thyroid derangement.  CT head without contrast on my interpretation shows no evidence of ischemia, mass effect, edema,  hemorrhage or other acute intracranial process.  I also reviewed radiology interpretation and agree with their findings with some unchanged chronic small vessel ischemic disease and atrophy without any other clear acute process.  CT abdomen pelvis on my interpretation shows no evidence of diverticulitis, kidney stone, left-sided perinephric stranding or acute injury to the hip.  As reviewed radiology capitation agree with the findings of slightly distended bladder with normal appendix and some evidence of constipation without evidence of an SBO or any other clear acute process.  Care of patient signed over to sitting provider approximately 1500.  Plan is to follow-up UA and reassess and likely discharge.  I  did discuss initial work-up with patient and daughter at bedside.  Patient has been a bit constipated seems we discussed initiating MiraLAX.  Advised that if the UA is negative I still recommend her follow-up with her PCP and ophthalmologist.       FINAL CLINICAL IMPRESSION(S) / ED DIAGNOSES   Final diagnoses:  Dizzy  Constipation, unspecified constipation type  Hypertension, unspecified type  History of dementia     Rx / DC Orders   ED Discharge Orders     None        Note:  This document was prepared using Dragon voice recognition software and may include unintentional dictation errors.   Lucrezia Starch, MD 03/29/21 1515

## 2021-03-29 NOTE — ED Notes (Signed)
Attempted to call [facility] to give update, no answer. AVS given to pt's daughter. Pt's daughter states she will take pt back to the facility.

## 2021-03-29 NOTE — ED Triage Notes (Signed)
Pt via EMS from [facility], [place]. See first nurse note. Pt states she is hurting on her R side of her abd and nauseous. Pt has hx of dementia. Pt is alert. Pt is not prescribed any narcotics per pt's MAR.

## 2021-03-29 NOTE — ED Triage Notes (Signed)
Pt brought in by [facility] from [facility] where pt called daughter and told them that she "felt like she was over medicated". Pt has history of dementia. VSS

## 2021-03-29 NOTE — ED Notes (Signed)
Pt's daughter asked for bedpan, pt sitting on bedpan states "It will not come out, they normally stick their finger up in there". MD notified.

## 2021-03-30 LAB — URINE CULTURE

## 2021-05-05 ENCOUNTER — Encounter: Payer: Self-pay | Admitting: Obstetrics & Gynecology

## 2021-05-05 ENCOUNTER — Ambulatory Visit (INDEPENDENT_AMBULATORY_CARE_PROVIDER_SITE_OTHER): Payer: Medicare Other | Admitting: Obstetrics & Gynecology

## 2021-05-05 ENCOUNTER — Other Ambulatory Visit: Payer: Self-pay

## 2021-05-05 VITALS — BP 128/76 | HR 66 | Ht 65.0 in | Wt 140.0 lb

## 2021-05-05 DIAGNOSIS — N8111 Cystocele, midline: Secondary | ICD-10-CM | POA: Diagnosis not present

## 2021-05-05 DIAGNOSIS — R350 Frequency of micturition: Secondary | ICD-10-CM

## 2021-05-05 DIAGNOSIS — N393 Stress incontinence (female) (male): Secondary | ICD-10-CM | POA: Diagnosis not present

## 2021-05-05 NOTE — Progress Notes (Signed)
?HPI: ?     [name] is a 81 y.o. No obstetric history on file. who presents today for her pessary follow up and examination related to her pelvic floor weakening.  Pt reports tolerating the pessary well with no vaginal bleeding and no vaginal discharge.  Symptoms of pelvic floor weakening have greatly improved. She is voiding and defecating without difficulty. She currently has a Incontinence Ring #5 pessary. ? ?PMHx: ?She  has a past medical history of Allergy, Anxiety, Depression, Fatty liver disease, nonalcoholic, GERD (gastroesophageal reflux disease), Heart disease, Hypertension, Lung cancer ([place]) (02/2014), and Urinary incontinence. Also,  has a past surgical history that includes Laryngoscopy (N/A, 07/22/2014); Laparoscopic hysterectomy (1977); Lung cancer surgery; Abdominal hysterectomy; Kyphoplasty (N/A, 05/04/2019); and Neck surgery., family history includes Cancer in her maternal grandfather and maternal grandmother; Kidney cancer in her cousin.,  reports that she quit smoking about 14 years ago. Her smoking use included cigarettes. She has a 12.50 pack-year smoking history. She has never used smokeless tobacco. She reports current alcohol use of about 2.0 standard drinks per week. She reports that she does not use drugs. ? ?She has a current medication list which includes the following prescription(s): acetaminophen, acetaminophen, azelastine, benazepril, cholecalciferol, diclofenac sodium, donepezil, duloxetine, estradiol, nystatin, omeprazole, polyethylene glycol powder, acidophilus, vitamin b-12, and [DISCONTINUED] nortriptyline. Also, is allergic to alendronate, nitrofurantoin, sulfa antibiotics, cefdinir, ciprofloxacin, diphenhydramine, and lactose intolerance (gi). ? ?Review of Systems  ?All other systems reviewed and are negative. ? ?Objective: ?BP 128/76   Pulse 66   Ht 5\' 5"  (1.651 m)   Wt 140 lb (63.5 kg)   BMI 23.30 kg/m?  ?Physical Exam ?Constitutional:   ?   General: She  is not in acute distress. ?   Appearance: She is well-developed.  ?Genitourinary:  ?   Bladder and urethral meatus normal.  ?   Genitourinary Comments: Cuff intact/ no lesions ? ?Absent uterus and cervix  ?   Vaginal cuff intact. ?   No vaginal erythema or bleeding.  ?   Anterior and apical vaginal prolapse present. ?   Moderate vaginal atrophy present. ? ?   Right Adnexa: not tender and no mass present. ?   Left Adnexa: not tender and no mass present. ?   Cervix is absent.  ?   Uterus is absent.  ?   Pelvic exam was performed with patient in the lithotomy position.  ?HENT:  ?   Head: Normocephalic and atraumatic.  ?   Nose: Nose normal.  ?Abdominal:  ?   General: There is no distension.  ?   Palpations: Abdomen is soft.  ?   Tenderness: There is no abdominal tenderness.  ?Musculoskeletal:     ?   General: Normal range of motion.  ?Neurological:  ?   Mental Status: She is alert and oriented to person, place, and time.  ?   Cranial Nerves: No cranial nerve deficit.  ?Skin: ?   General: Skin is warm and dry.  ?Psychiatric:     ?   Attention and Perception: Attention normal.     ?   Mood and Affect: Mood normal.     ?   Speech: Speech normal.     ?   Behavior: Behavior normal.     ?   Cognition and Memory: Cognition normal.     ?   Judgment: Judgment normal.  ? ? ?Pessary Care ?Pessary removed and cleaned.  Vagina checked - without erosions - pessary replaced. ? ?  A/P: ?  ICD-10-CM   ?1. 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  ?2. Stress incontinence of urine  N39.3   ?  ?3. Urinary frequency  R35.0   ?  ? ?Pessary was cleaned and replaced today. ?Instructions given for care. ?Concerning symptoms to observe for are counseled to patient. ?Follow up scheduled for 3 months. ? ?A total of 20 minutes were spent face-to-face with the patient as well as preparation, review, communication, and documentation during this encounter.  ? ?Barnett Applebaum, MD, [place] ?[facility], [place] ?05/05/2021  4:17 PM ? ?

## 2021-07-09 ENCOUNTER — Other Ambulatory Visit: Payer: Self-pay

## 2021-07-09 ENCOUNTER — Emergency Department
Admission: EM | Admit: 2021-07-09 | Discharge: 2021-07-09 | Disposition: A | Discharge status: Home / Self Care | Payer: Medicare Other | Attending: Emergency Medicine | Admitting: Emergency Medicine

## 2021-07-09 ENCOUNTER — Emergency Department: Payer: Medicare Other

## 2021-07-09 DIAGNOSIS — M25552 Pain in left hip: Secondary | ICD-10-CM | POA: Diagnosis not present

## 2021-07-09 DIAGNOSIS — F0390 Unspecified dementia without behavioral disturbance: Secondary | ICD-10-CM | POA: Insufficient documentation

## 2021-07-09 DIAGNOSIS — W19XXXA Unspecified fall, initial encounter: Secondary | ICD-10-CM | POA: Diagnosis not present

## 2021-07-09 NOTE — Discharge Instructions (Signed)
Please follow-up with your doctor for recheck/reevaluation.  Your hip x-ray today is normal.  Please use Tylenol as needed for discomfort as written on the box.  Return to the emergency department for any symptom personally concerning to yourself or staff members.

## 2021-07-09 NOTE — ED Notes (Signed)
Report given to Wynn Banker, RN at [facility]

## 2021-07-09 NOTE — ED Notes (Signed)
Pt taken for xray

## 2021-07-09 NOTE — ED Triage Notes (Signed)
Pt comes into the ED via EMS from [facility], hx of dementia, fell on Tuesday  c/o left side pain. Upon assessing the pt she points to the left hip area where the pain is located, denies any other pain  171/80 97%RA HR71

## 2021-07-09 NOTE — ED Provider Notes (Signed)
   [facility] Provider Note    Event Date/Time   First MD Initiated Contact with Patient 07/09/21 1001     (approximate)  History   Chief Complaint: Fall  HPI  Christy [name] is a 81 y.o. female with a past medical history of dementia presents to the emergency department from her nursing facility after a fall on Tuesday.  According to EMS report patient was complaining of some left-sided pain.  Here in the emergency department patient points to her left hip area.  During my evaluation patient does not recall why she is here does not recall a fall.  Physical Exam   Triage Vital Signs: ED Triage Vitals  Enc Vitals Group     BP 07/09/21 0939 125/74     Pulse Rate 07/09/21 0939 76     Resp 07/09/21 0939 16     Temp 07/09/21 0939 97.9 F (36.6 C)     Temp Source 07/09/21 0939 Oral     SpO2 07/09/21 0939 93 %     Weight 07/09/21 0925 155 lb (70.3 kg)     Height 07/09/21 0925 5\' 5"  (1.651 m)     Head Circumference --      Peak Flow --      Pain Score --      Pain Loc --      Pain Edu? --      Excl. in [place]? --     Most recent vital signs: Vitals:   07/09/21 0939  BP: 125/74  Pulse: 76  Resp: 16  Temp: 97.9 F (36.6 C)  SpO2: 93%    General: Awake, no distress.  No sign of head injury CV:  Good peripheral perfusion.  Regular rate and rhythm  Resp:  Normal effort.  Equal breath sounds bilaterally.  Abd:  No distention.  Soft, nontender.  No rebound or guarding.  Benign abdomen Other:  No hip tenderness to palpation.  Good range of motion in bilateral hips with no pain elicited.   RADIOLOGY  I have interpreted the hip x-ray images I do not see any fracture or dislocation. Radiology is read the hip x-ray is negative   MEDICATIONS ORDERED IN ED: Medications - No data to display   IMPRESSION / MDM / [place] / ED COURSE  I reviewed the triage vital signs and the nursing notes.  Patient's presentation is most consistent with  acute complicated illness / injury requiring diagnostic workup.  Patient presents to the emergency department for complaints of left hip pain from a nursing facility since a fall on Tuesday.  X-rays negative for any significant abnormality.  Patient has benign abdomen on my evaluation.  Good range of motion in the hip on my evaluation.  Patient possibly has a contusion which could be causing some discomfort.  Overall patient appears well and I believe she is safe for discharge back to her nursing facility with outpatient follow-up.  FINAL CLINICAL IMPRESSION(S) / ED DIAGNOSES   Fall   Note:  This document was prepared using Dragon voice recognition software and may include unintentional dictation errors.   Harvest Dark, MD 07/09/21 1012

## 2021-12-07 ENCOUNTER — Encounter (INDEPENDENT_AMBULATORY_CARE_PROVIDER_SITE_OTHER): Payer: Self-pay

## 2022-02-24 ENCOUNTER — Other Ambulatory Visit: Payer: Self-pay | Admitting: Family

## 2022-02-24 DIAGNOSIS — R1310 Dysphagia, unspecified: Secondary | ICD-10-CM

## 2022-03-08 ENCOUNTER — Ambulatory Visit
Admission: RE | Admit: 2022-03-08 | Discharge: 2022-03-08 | Disposition: A | Discharge status: Home / Self Care | Payer: Medicare Other | Source: Ambulatory Visit | Attending: Family | Admitting: Family

## 2022-03-08 DIAGNOSIS — R1312 Dysphagia, oropharyngeal phase: Secondary | ICD-10-CM | POA: Diagnosis present

## 2022-03-08 DIAGNOSIS — R1310 Dysphagia, unspecified: Secondary | ICD-10-CM | POA: Insufficient documentation

## 2022-03-08 NOTE — Progress Notes (Signed)
Modified Barium Swallow Progress Note  Patient Details  Name: Christy [name] MRN: [id number] Date of Birth: January 12, 1941  Today's Date: 03/08/2022  Modified Barium Swallow completed.  Full report located under Chart Review in the Imaging Section.  Brief recommendations include the following:  HPI: Patient is an 82 y.o. female with past medical history including dementia, GERD, HTN, depression and anxiety, chronic constipation, lung cancer, CKD, UTI, and fibromyalgia. She is referred from her assisted living facility, [facility], for MBS to assess dysphagia. Pt and daughter report swallowing difficulties with both solids and liquids. Pt states she gets "strangled" and also feels foods sticking in her throat. Had PNA in 07/2020.   Clinical Impression Patient presents with moderate-severe multifactorial oropharyngeal and pharyngoesophageal dysphagia with silent aspiration of thin and nectar-thick liquids. Noted cervical lordosis, previously documented in 07/13/20 Cervical spine x-ray. Oral stage is impaired, characterized by adequate lip closure however slow, disorganized bolus transport, and premature spillage into the pharynx. Pharyngeal swallow initiation is delayed at the level of the pyriform sinus. Pharyngeal stage noted for reduced tongue base retraction. Reduced hyolaryngeal excursion contributes to incomplete epiglottic inversion as well as reduced amplitude and duration of UES opening, resulting in incomplete stripping of the bolus from the pharynx. Pharyngeal stripping wave was also reduced. There is mild-moderate residue in the valleculae and pyriforms with thin liquids, moderate with nectar, and severe residue with pudding. Penetration occurs both before the swallow (delay) and during the swallow due to incomplete airway closure, with all consistencies tested (thin, nectar, honey, and pudding). Aspiration occured during the swallow due to incomplete airway closure, with thin liquids and  nectar. Cued cough was effective to clear penetration, but not aspiration. Pharynogoesophageal phase of swallowing was noted for collection of barium in the cervical esophagus with appearance of slow/delayed clearance. Reviewed study findings with patient using images for teachback. Pt's daughter was not present for the exam, but findings were discussed at length over the phone. Reviewed management approaches in the context of chronic dysphagia as well as other contributing factors including dementia and history of reflux. Discussed benefits of palliative care to aid decisionmaking re: managing chronic dysphagia, as daughter reports pt would not want to have a feeding tube. While modifying diet consistency to puree solids and honey-thick liquids may reduce risk for aspiration during the swallow, pt remains at risk of aspiration regardless of consistency due to reduced esophageal clearance. Long-term use of thickened liquids may be contraindicated given history of reflux and esophageal dysphagia. Daughter verbalized importance of quality of life to pt, and that she has expressed in the past "I hope I don't have to go on thickened liquids." Recommend palliative care consult to clarify goals of care prior to modification of diet. Recommend medications be crushed in puree to facilitate easier swallowing. Pt's cough is weak and voice is hoarse/low intensity; consider ENT consult as pt reports frustration with her voice.      Swallow Evaluation Recommendations   Recommended Consults: Consider ENT evaluation;Other (Comment) (Palliative care consult)   SLP Diet Recommendations: Other (Comment) (Recommend palliative care consult to clarify goals of care prior to modifying diet. See clinicial impression for details.)       Medication Administration: Crushed with puree   Supervision: Full supervision/cueing for compensatory strategies   Compensations: Slow rate;Small sips/bites;Hard cough after swallow;Multiple  dry swallows after each bite/sip   Postural Changes: Remain semi-upright after after feeds/meals (Comment);Seated upright at 90 degrees   Oral Care Recommendations: Oral care before and after  [place], [place], [employer] Pathologist [phone]    Aliene Altes 03/08/2022,3:57 PM

## 2022-04-27 IMAGING — CT CT CHEST W/ CM
2 of 4 series · 15 of 36 positions shown, 18 images · IV contrast (omnipaque)
Comparison: CT chest 02/24/2018.  Thoracic spine CT 05/03/2019.

CLINICAL DATA: Restaging lung cancer post right upper lobectomy in
[id number]. No current complaints.

EXAM:
CT CHEST WITH CONTRAST
TECHNIQUE: Multidetector CT imaging of the chest was performed during
intravenous contrast administration.
CONTRAST:  75mL OMNIPAQUE IOHEXOL 300 MG/ML  SOLN

[Series 2: axial chest 2.00 · axial · 0.56mm/px · z∈[-1141,-887]mm · 12 of 151 slices shown, 15 images]
[im 12/151  mediastinal]
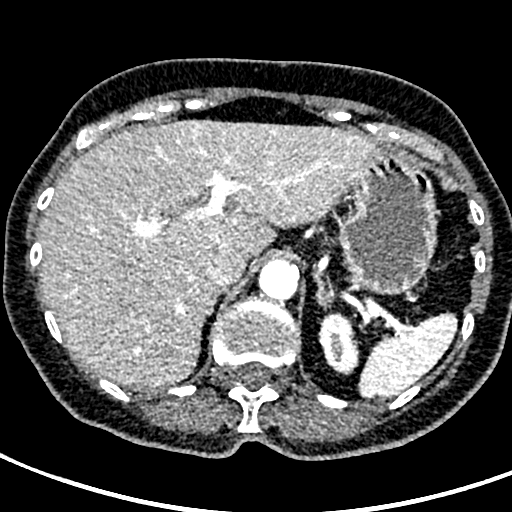
[im 12/151  lung]
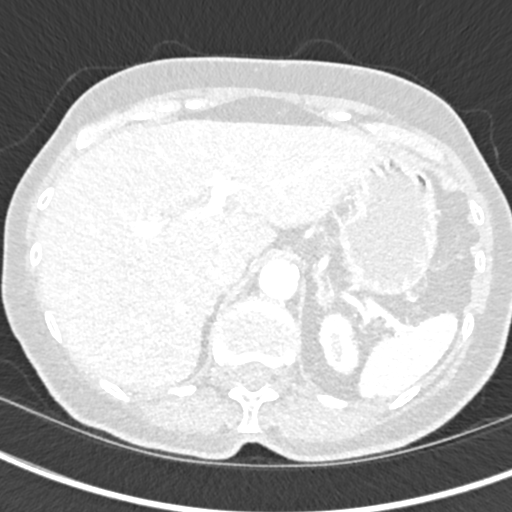
[im 24/151  lung]
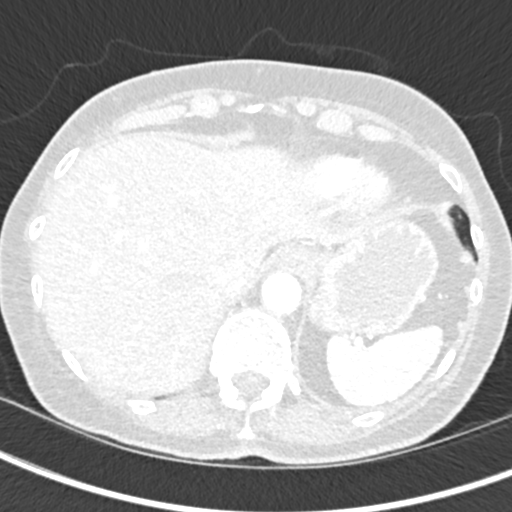
[im 35/151  lung]
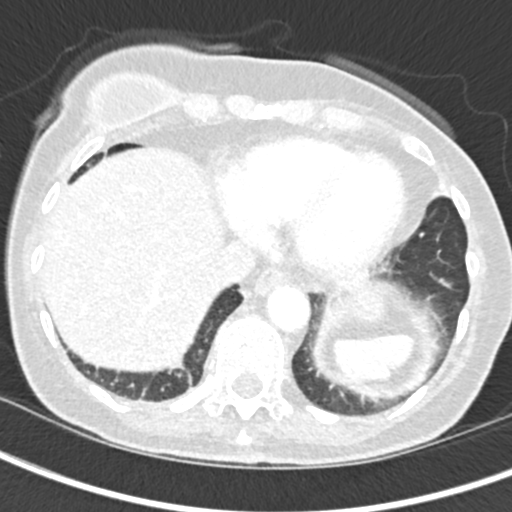
[im 47/151  lung]
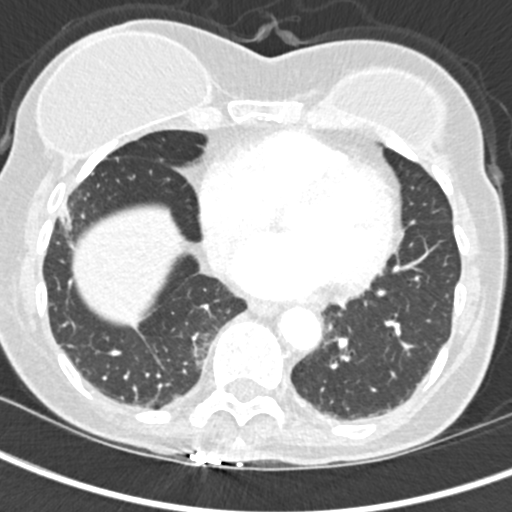
[im 58/151  mediastinal]
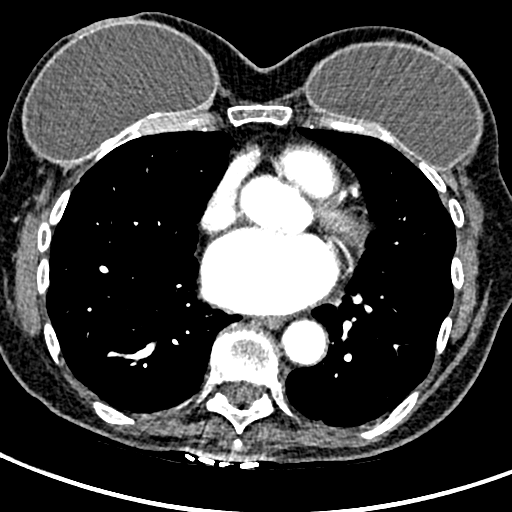
[im 58/151  lung]
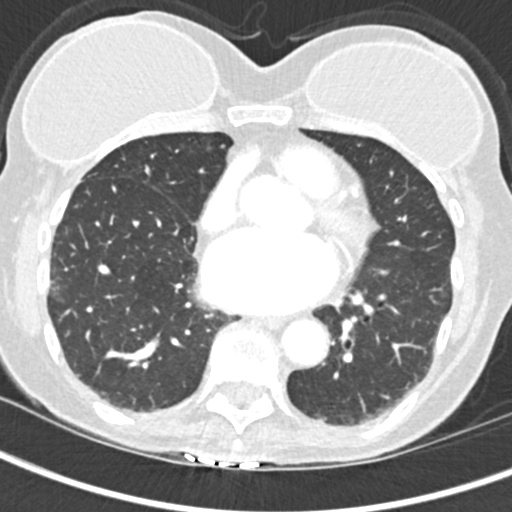
[im 70/151  lung]
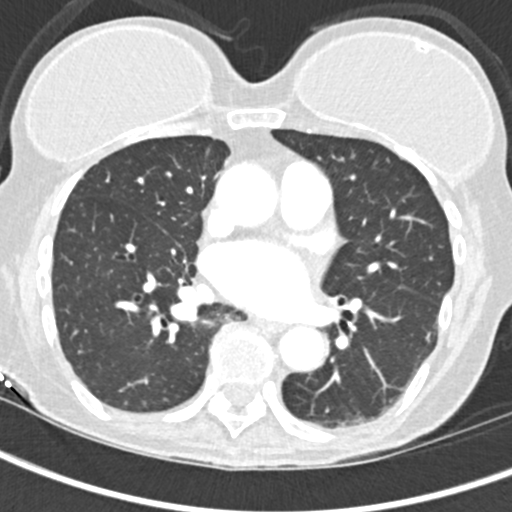
[im 81/151  lung]
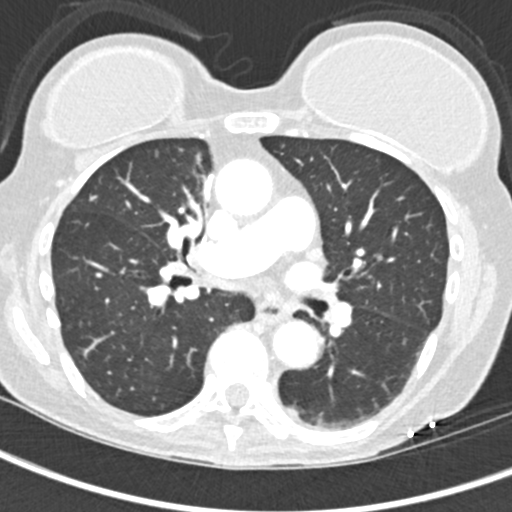
[im 93/151  lung]
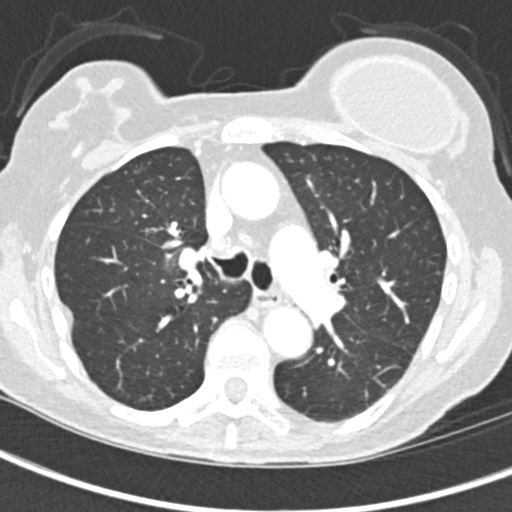
[im 104/151  mediastinal]
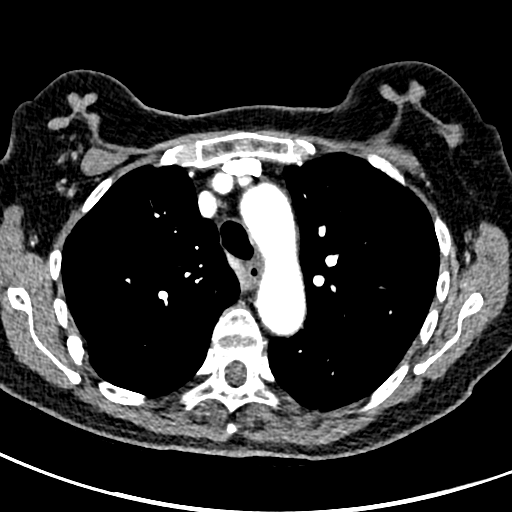
[im 104/151  lung]
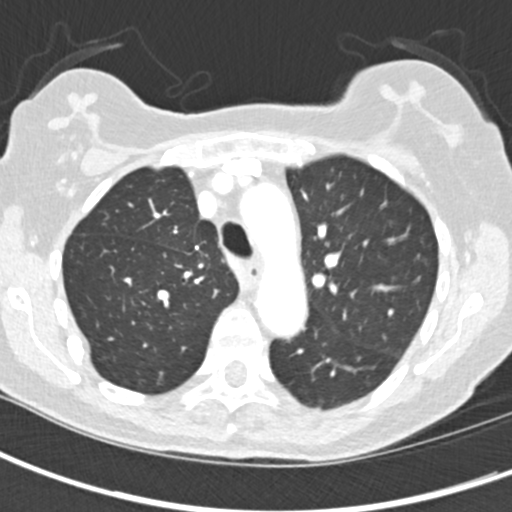
[im 116/151  lung]
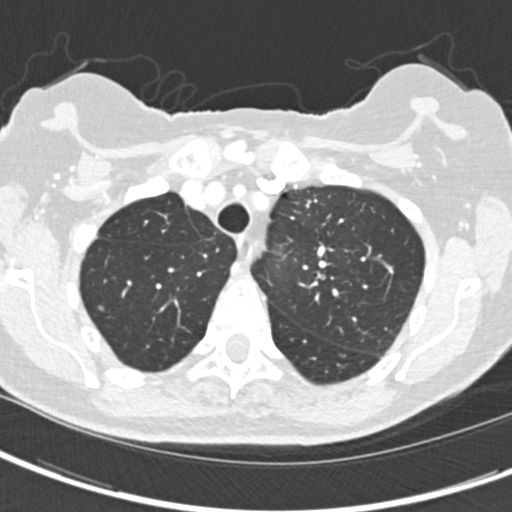
[im 127/151  lung]
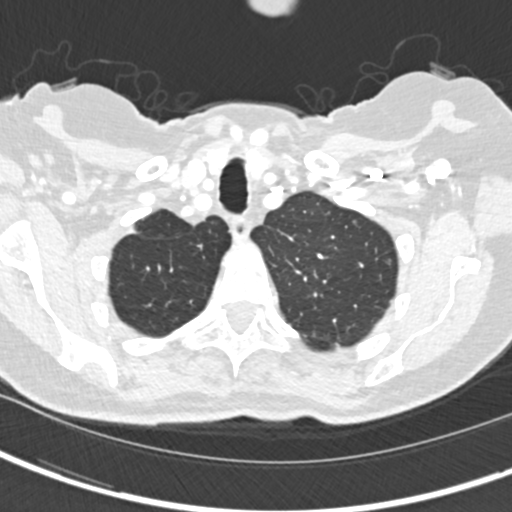
[im 139/151  lung]
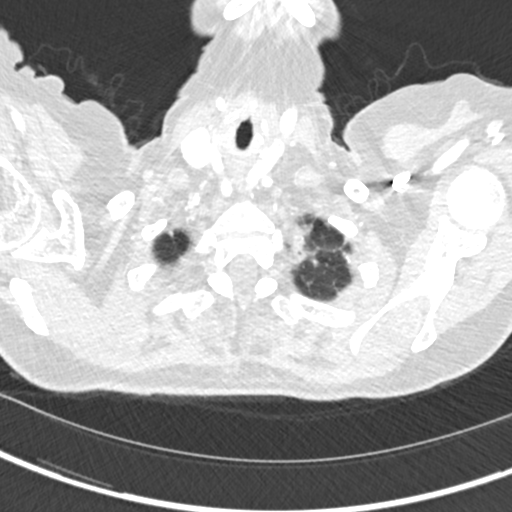

[Series 4: coronal chest 2.00 cor · coronal · 0.56mm/px · 3 of 125 slices shown]
[im 25/125  lung]
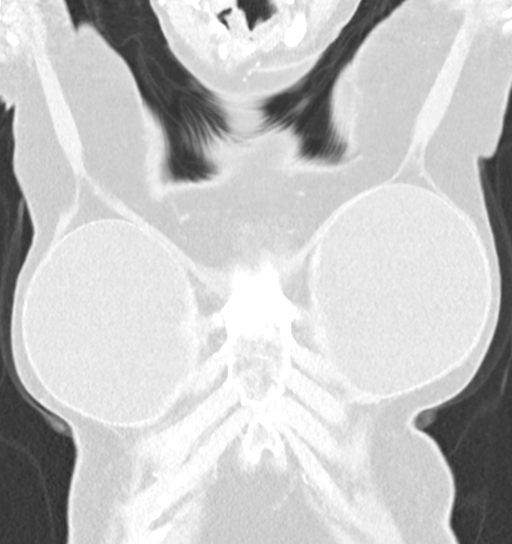
[im 50/125  lung]
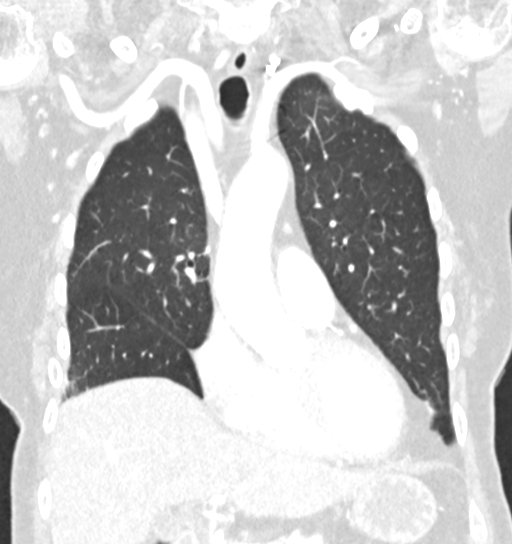
[im 75/125  lung]
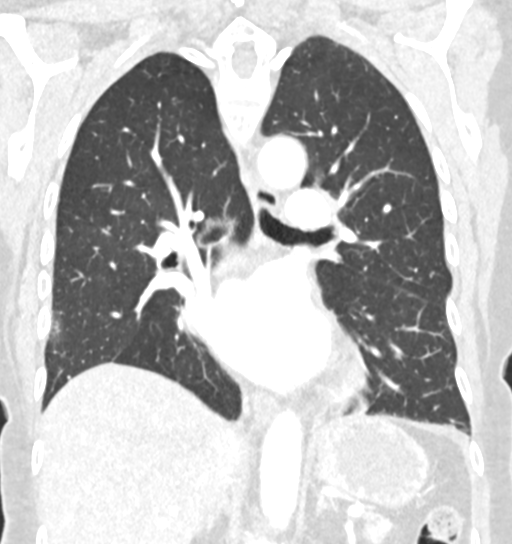

[15 of 36 positions shown; findings below may reference images not displayed]

FINDINGS: Cardiovascular: Mild atherosclerosis of the aorta, great vessels and
coronary arteries. No acute vascular findings are seen. There is no
evidence of acute pulmonary embolism. The heart size is normal.
There is no pericardial effusion.

Mediastinum/Nodes: There are no enlarged mediastinal, hilar or
axillary lymph nodes. The thyroid gland, trachea and esophagus
demonstrate no significant findings.

Lungs/Pleura: There is no pleural effusion or pneumothorax. Stable
postsurgical changes following right upper lobectomy. There is
stable mild biapical scarring. Scattered small pulmonary nodules are
unchanged, including a 5 mm perifissural nodule along the minor
fissure on image 99/3 and a 6 x 5 mm right lower lobe nodule on
image 109/3. No new or enlarging nodules identified.

Upper abdomen: The visualized upper abdomen appears stable without
significant findings.

Musculoskeletal/Chest wall: There is no chest wall mass or
suspicious osseous finding. Status post bilateral mastectomy and
subpectoral breast implant placement. Thoracotomy changes within the
right chest wall are stable. Interval spinal augmentation at T7 for
a fracture. There is a mild superior endplate compression deformity
at T3 which appears new from previous thoracic spine CT. No evidence
of osseous metastatic disease.
IMPRESSION: 1. Stable chest CT status post right upper lobectomy. No evidence of
local recurrence or metastatic disease.
2. Stable small pulmonary nodules bilaterally consistent with benign
findings.
3. Interval T7 spinal augmentation for a fracture. Mild superior
endplate compression deformity at T3 appears new from previous
thoracic spine CT.
4. Aortic Atherosclerosis ([id number]).

## 2022-07-10 DEATH — deceased
